# Patient Record
Sex: Female | Born: 1945 | Race: White | Hispanic: No | Marital: Married | Smoking: Never smoker
Health system: Southern US, Community
[De-identification: ages and names within clinical notes are randomized; demographics above are authoritative.]

## PROBLEM LIST (undated history)

## (undated) DIAGNOSIS — S82143A Displaced bicondylar fracture of unspecified tibia, initial encounter for closed fracture: Secondary | ICD-10-CM

## (undated) DIAGNOSIS — F419 Anxiety disorder, unspecified: Secondary | ICD-10-CM

## (undated) HISTORY — PX: KNEE ARTHROCENTESIS: SHX991

## (undated) HISTORY — PX: WRIST FRACTURE SURGERY: SHX121

## (undated) HISTORY — DX: Displaced bicondylar fracture of unspecified tibia, initial encounter for closed fracture: S82.143A

## (undated) HISTORY — PX: ORIF PROXIMAL TIBIAL PLATEAU FRACTURE: SUR953

## (undated) HISTORY — PX: ANKLE FRACTURE SURGERY: SHX122

## (undated) HISTORY — PX: CATARACT EXTRACTION: SUR2

## (undated) HISTORY — PX: ANKLE ARTHROCENTESIS: SHX878

---

## 1997-08-10 ENCOUNTER — Other Ambulatory Visit: Admission: RE | Admit: 1997-08-10 | Discharge: 1997-08-10 | Payer: Self-pay | Admitting: Gynecology

## 1998-09-13 ENCOUNTER — Other Ambulatory Visit: Admission: RE | Admit: 1998-09-13 | Discharge: 1998-09-13 | Payer: Self-pay | Admitting: Gynecology

## 2000-08-07 ENCOUNTER — Inpatient Hospital Stay (HOSPITAL_COMMUNITY): Admission: EM | Admit: 2000-08-07 | Discharge: 2000-08-09 | Payer: Self-pay | Admitting: Emergency Medicine

## 2000-08-07 ENCOUNTER — Encounter: Payer: Self-pay | Admitting: Emergency Medicine

## 2000-08-07 ENCOUNTER — Encounter: Payer: Self-pay | Admitting: Orthopedic Surgery

## 2001-05-30 ENCOUNTER — Other Ambulatory Visit: Admission: RE | Admit: 2001-05-30 | Discharge: 2001-05-30 | Payer: Self-pay | Admitting: Obstetrics and Gynecology

## 2001-11-27 ENCOUNTER — Ambulatory Visit (HOSPITAL_COMMUNITY): Admission: RE | Admit: 2001-11-27 | Discharge: 2001-11-27 | Payer: Self-pay | Admitting: Specialist

## 2003-01-02 ENCOUNTER — Other Ambulatory Visit: Admission: RE | Admit: 2003-01-02 | Discharge: 2003-01-02 | Payer: Self-pay | Admitting: Obstetrics and Gynecology

## 2003-03-27 ENCOUNTER — Ambulatory Visit (HOSPITAL_COMMUNITY): Admission: RE | Admit: 2003-03-27 | Discharge: 2003-03-27 | Payer: Self-pay | Admitting: Gastroenterology

## 2003-04-25 DIAGNOSIS — S82143A Displaced bicondylar fracture of unspecified tibia, initial encounter for closed fracture: Secondary | ICD-10-CM

## 2003-04-25 HISTORY — DX: Displaced bicondylar fracture of unspecified tibia, initial encounter for closed fracture: S82.143A

## 2003-09-18 ENCOUNTER — Inpatient Hospital Stay (HOSPITAL_COMMUNITY): Admission: EM | Admit: 2003-09-18 | Discharge: 2003-09-23 | Payer: Self-pay | Admitting: Emergency Medicine

## 2004-07-14 ENCOUNTER — Other Ambulatory Visit: Admission: RE | Admit: 2004-07-14 | Discharge: 2004-07-14 | Payer: Self-pay | Admitting: Obstetrics and Gynecology

## 2004-11-21 IMAGING — CT CT RECONSTRUCTION
2 series · 10 of 14 positions shown, 12 images · non-contrast
Comparison: none

CLINICAL DATA: 58-year-old patient, who fell. 
 CT OF THE CERVICAL SPINE WITH MULTIPLANAR AND CT OF THE RIGHT KNEE WITH MULTIPLANAR 
 CERVICAL SPINE CT WITHOUT CONTRAST
 Standard helical CT examination of the cervical spine was performed from the skull base down through T-2.  No fractures are identified. The facet joints are maintained and the neural foramen are patent. 
 IMPRESSION
 1.  No evidence for cervical spine fracture. 
 MULTIPLANAR REFORMATTED IMAGES
 Sagittal and coronal reformatted images were obtained off of the axial data.  There is some artifact to the lower cervical spine due to the size of the patient and soft tissue attenuation.  The alignment is normal.  No fractures are seen.  No abnormal prevertebral soft tissue swelling.  Mild facet degenerative changes are noted. 
 1.  Normal alignment and no acute bony findings. 
 CT OF THE RIGHT KNEE 
 Helical CT examination of the knee was performed and demonstrates a complex fracture of the tibial plateau. There is a deep die punch-type fracture splitting the tibial shaft in the midline with displacement of the lateral aspect of the tibia and subluxation of the tibia in relation to the femur.  There is also a small avulsion-type fracture off of the fibular head. 
 1. Complex tibial plateau fracture with a deep vertical die punch-type fracture splitting the tibia in two.  There is subluxation of the tibia laterally in relation to the femur.  
 MULTIPLANAR REFORMAT IMAGES
 Coronal, sagittal and 3-D reformatted images were obtained off of the axial data.  These confirm the above findings and better demonstrate the anatomy.  
 See above report.

[Series 4: rt. knee 2.0 b80s · axial · 0.35mm/px · z∈[-10,+90]mm · 3 of 102 slices shown (1 of 2)]
[im 26/102  bone]
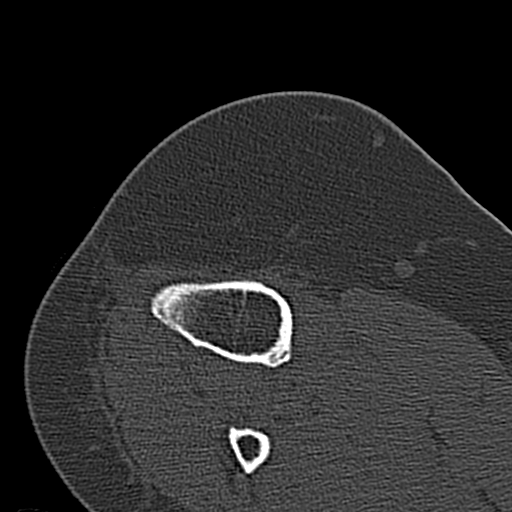
[im 51/102  bone]
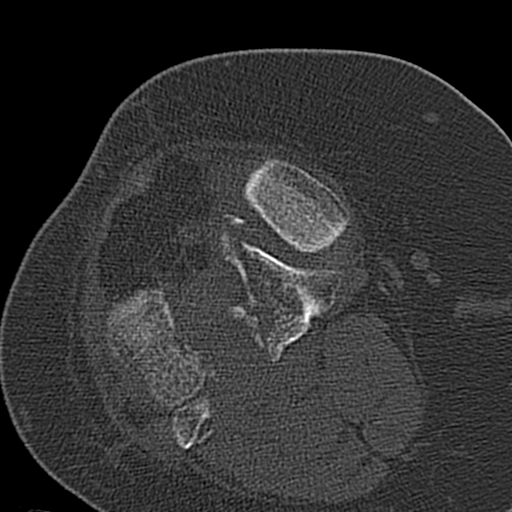
[im 76/102  bone]
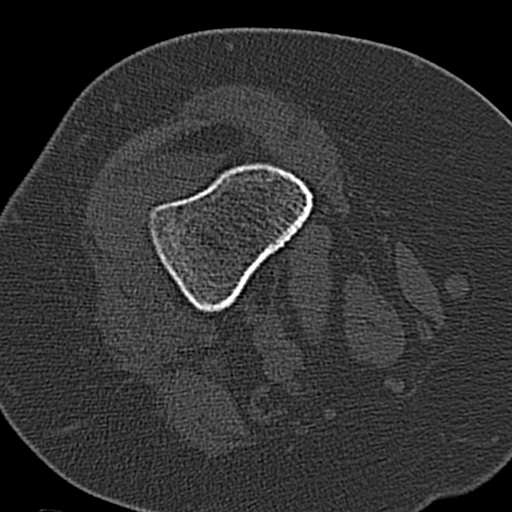

[Series 5: rt. knee 2.0 b80s · axial · 0.35mm/px · z∈[-36,+116]mm · 7 of 204 slices shown, 9 images (2 of 2)]
[im 26/204  soft-tissue]
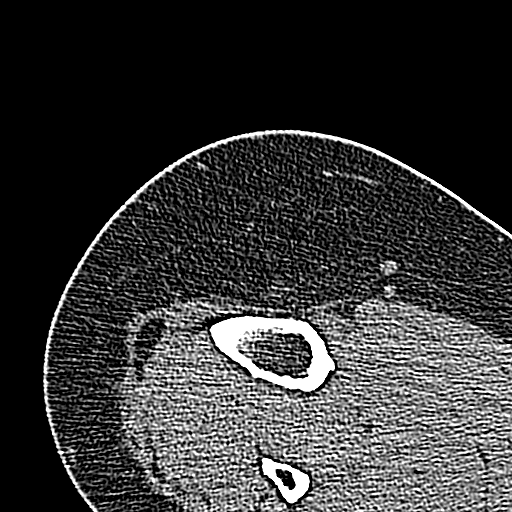
[im 26/204  bone]
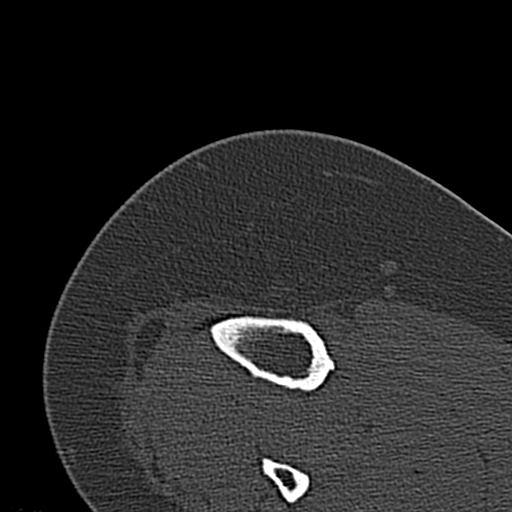
[im 51/204  bone]
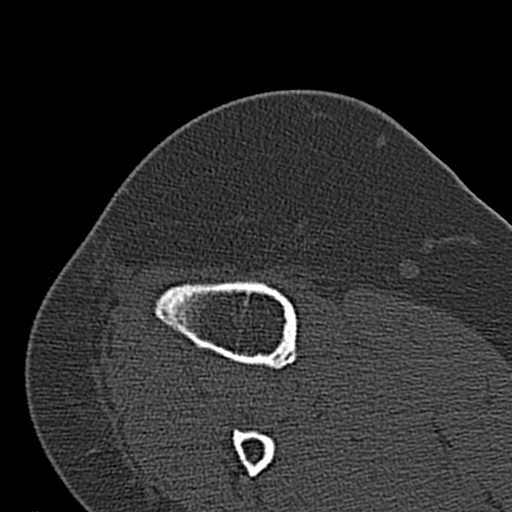
[im 77/204  bone]
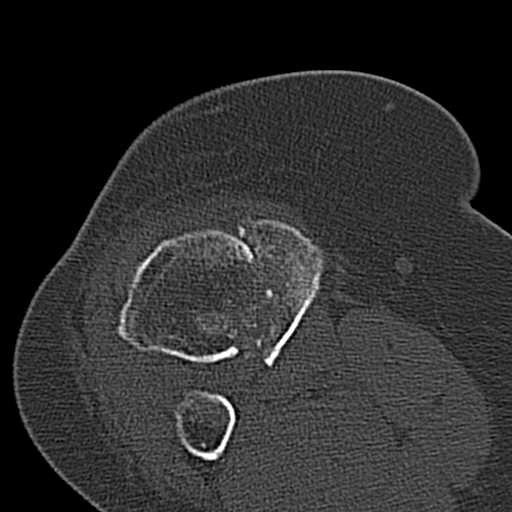
[im 102/204  bone]
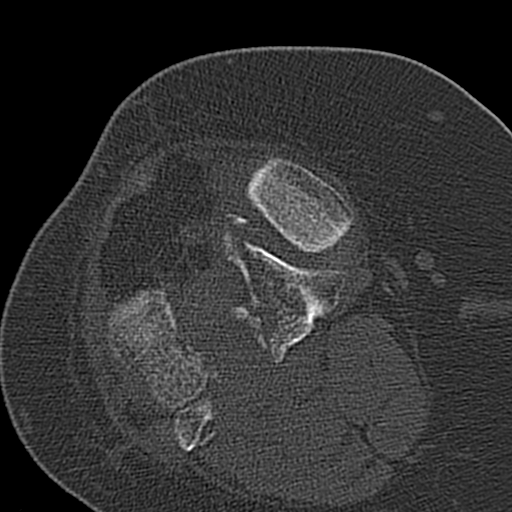
[im 127/204  soft-tissue]
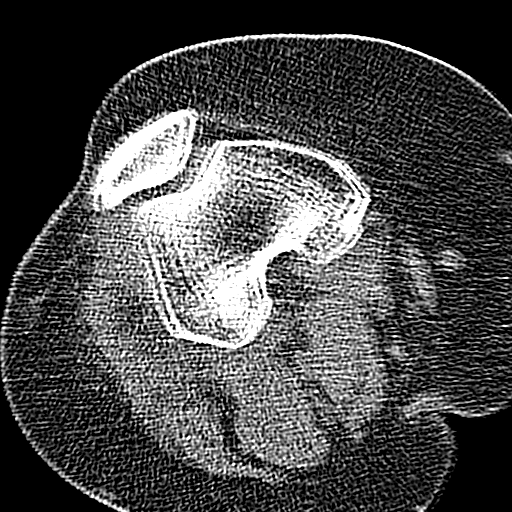
[im 127/204  bone]
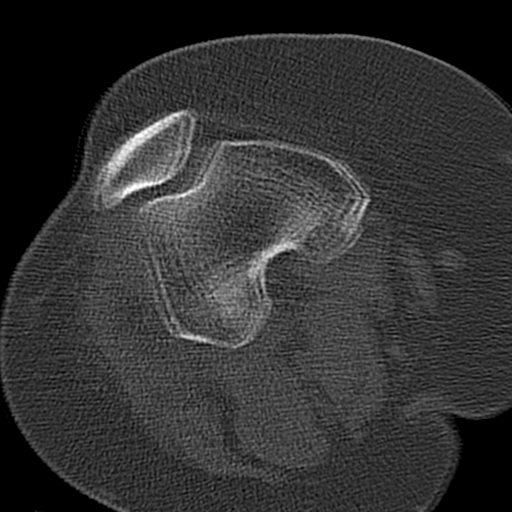
[im 153/204  bone]
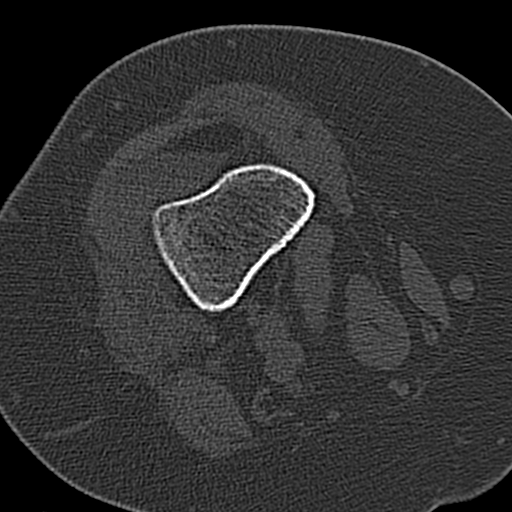
[im 178/204  bone]
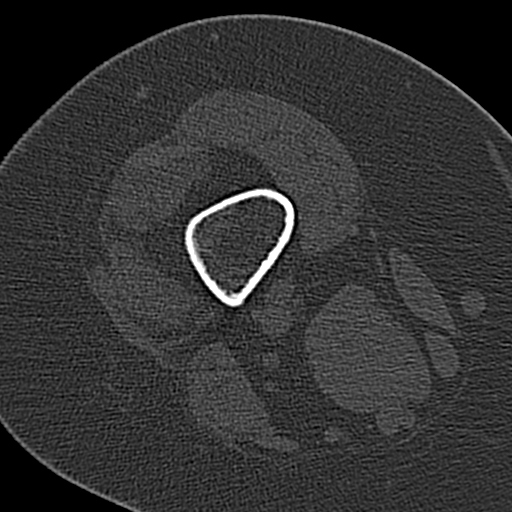

[10 of 14 positions shown; findings below may reference images not displayed]

## 2004-11-21 IMAGING — CR DG CHEST 2V
2 series · 2 of 2 positions shown · non-contrast
Comparison: none

CLINICAL DATA: Right arm and leg fractures.  Preop respiratory exam. 
 TWO VIEW CHEST
 AP and lateral views show low lung volumes, however both lungs are clear.  There is no evidence of pneumothorax or pleural effusion.  Heart size is within normal limits allowing for patient positioning. 
 IMPRESSION
 Low lung volumes.  No active disease.

[view not recorded (1 of 2)]
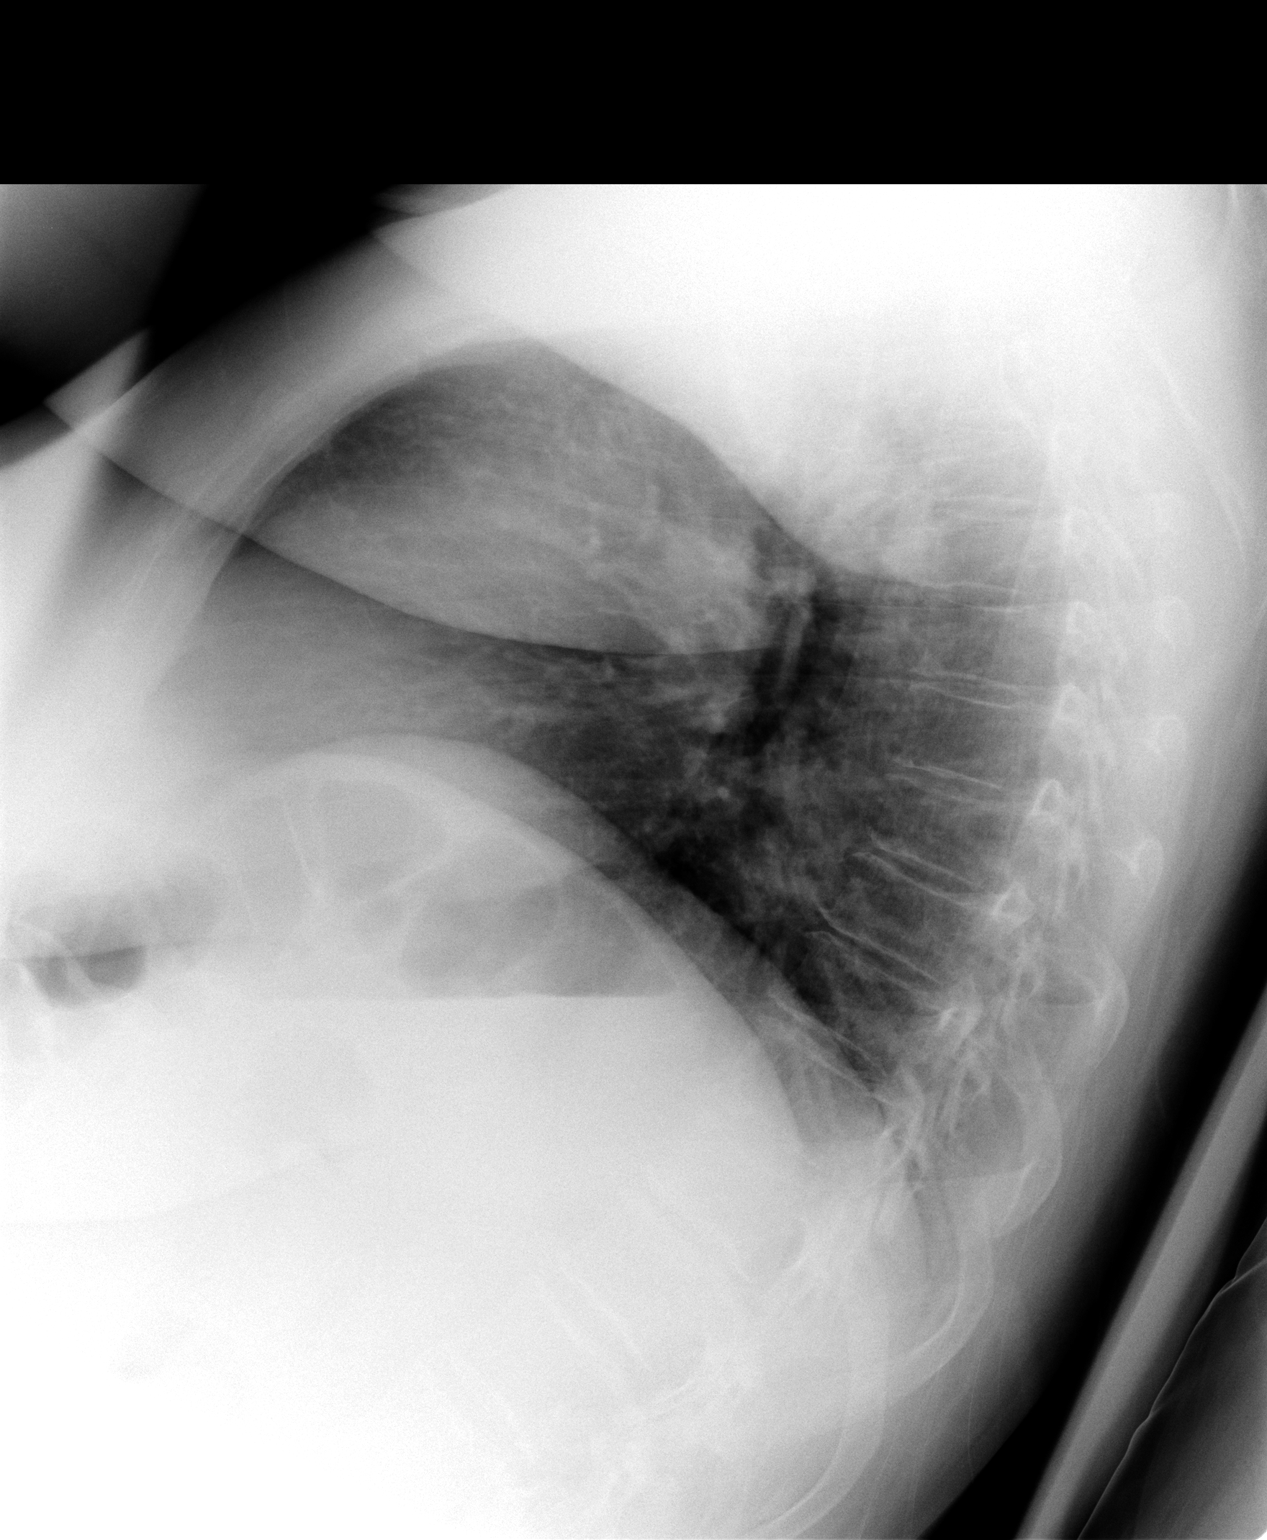

[view not recorded (2 of 2)]
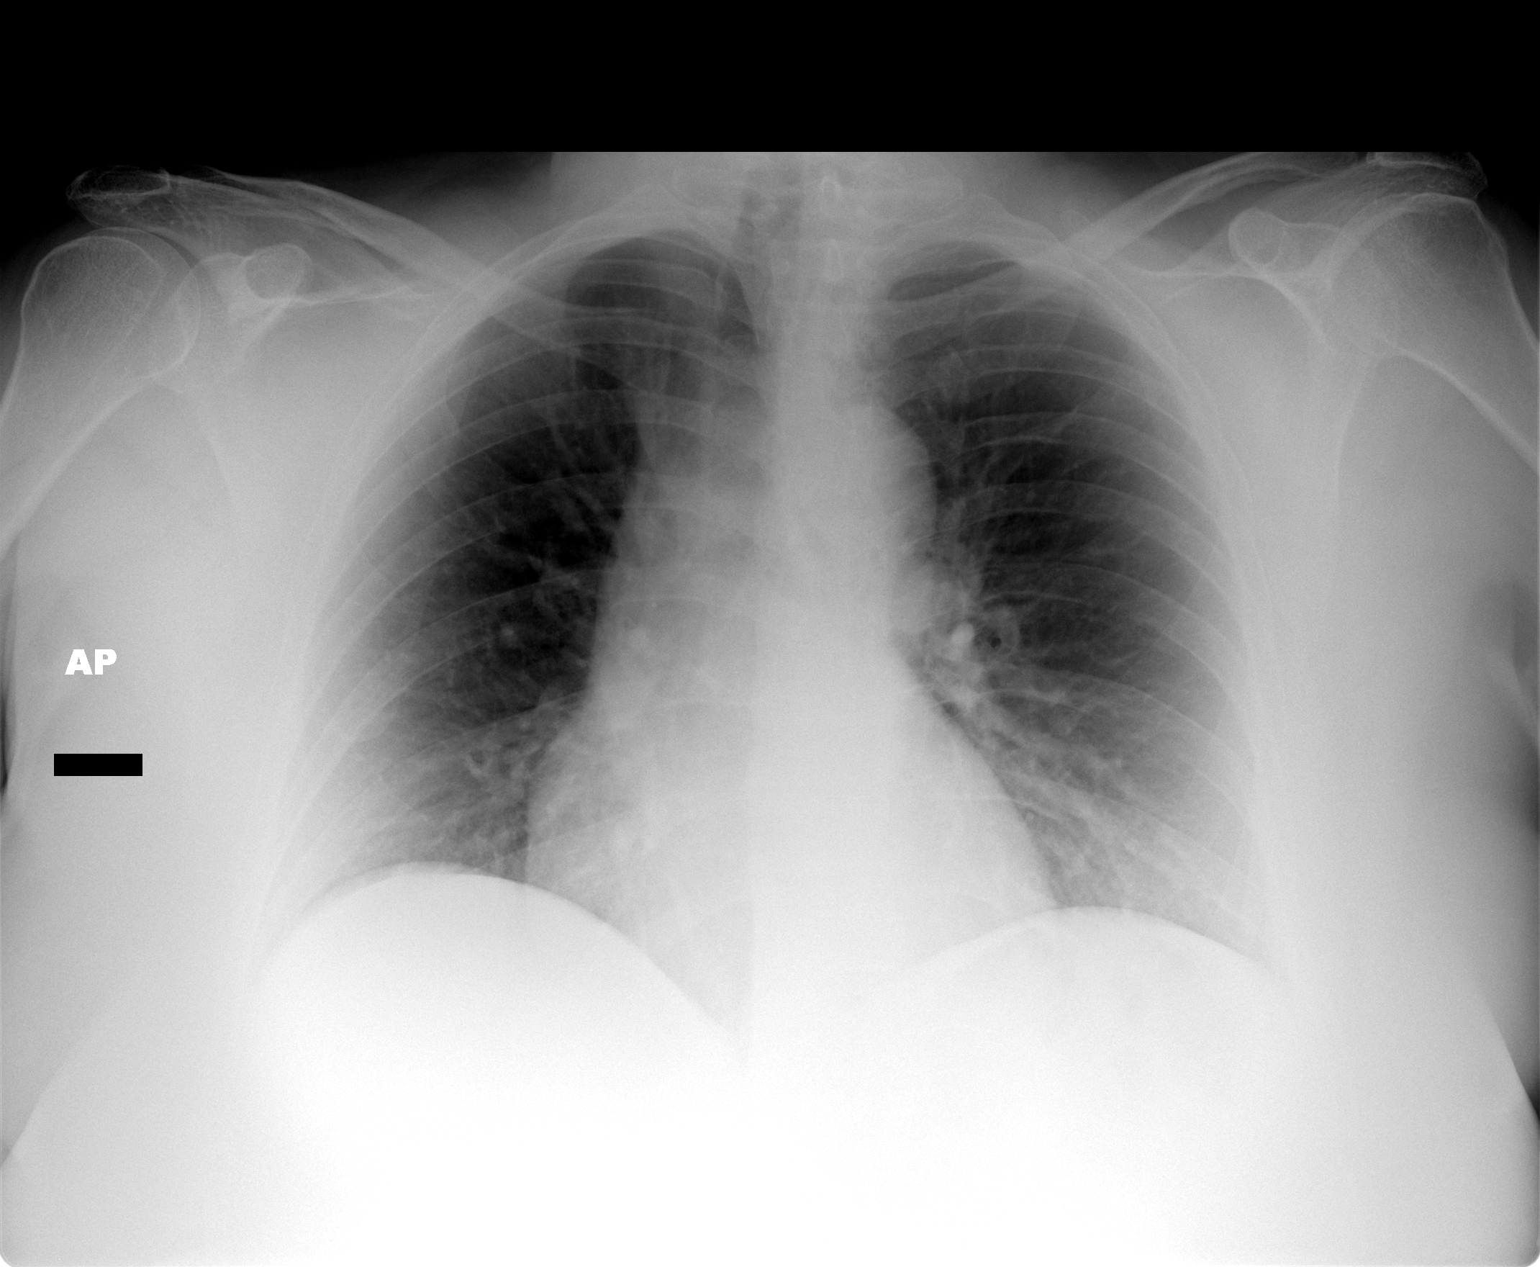

[2 of 2 positions shown; findings below may reference images not displayed]

## 2004-11-21 IMAGING — CT CT EXTREM UP W/O CM*R*
1 of 4 series · 4 of 14 positions shown, 5 images · non-contrast
Comparison: Prior plain film of the right wrist on the same date.

CLINICAL DATA: Right wrist fracture.
CT OF THE RIGHT UPPER EXTREMITY WITHOUT CONTRAST, CT MULTIPLANAR RECONSTRUCTION, 09/18/03

[Series 6: rt. wrist 1.25 b80s · axial · 0.23mm/px · z∈[+1054,+1116]mm · 4 of 211 slices shown, 5 images]
[im 43/211  soft-tissue]
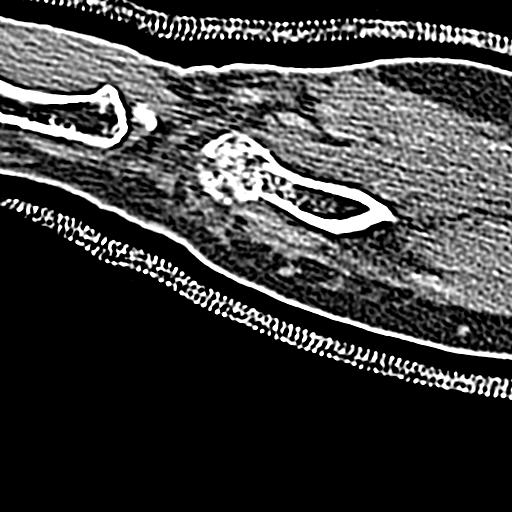
[im 43/211  bone]
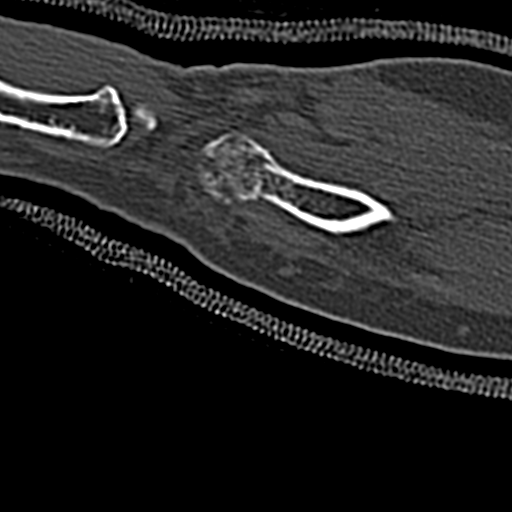
[im 85/211  bone]
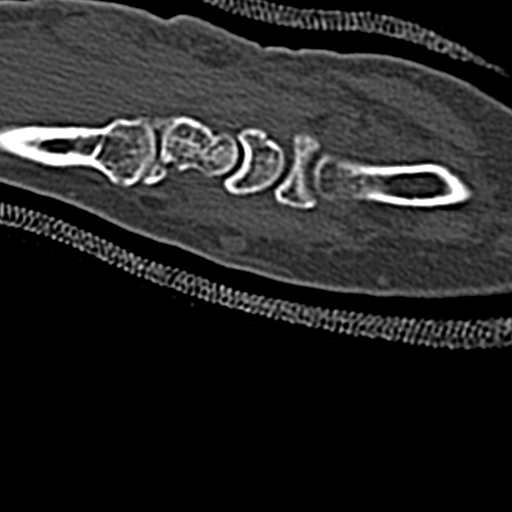
[im 127/211  bone]
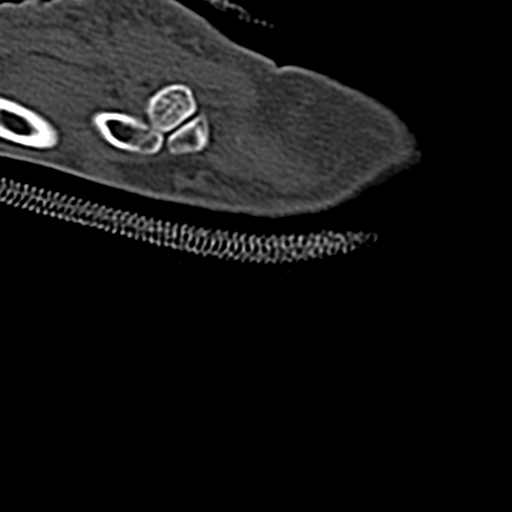
[im 169/211  bone]
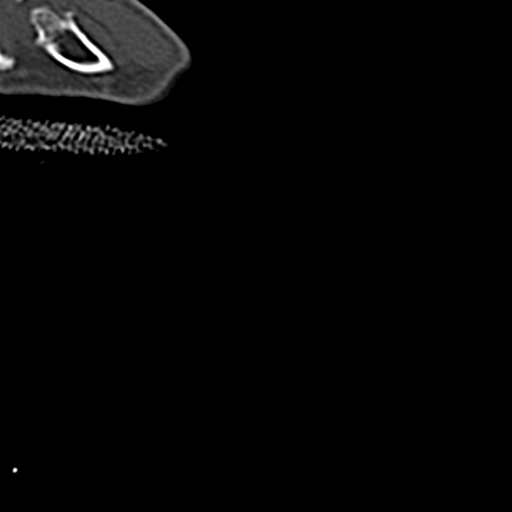

[4 of 14 positions shown; findings below may reference images not displayed]

RIGHT WRIST CT WITHOUT CONTRAST
Unenhanced thin section multidetector CT was performed through the right wrist.  Additional reconstructions were performed in coronal and sagittal projections for interpretative purposes.  
There is a comminuted fracture of the distal radius with mild displacement of multiple fragments.  Fracture planes do extend into the radiocarpal joint.  Fracture also shows mild impaction.  No evidence of carpal bone fracture or subluxation.  There is a tiny avulsion off of the tip of the ulnar styloid.
IMPRESSION
Comminuted intraarticular fracture of the distal radius with mild displacement of multiple fracture fragments as well as mild impaction.  Associated small avulsion off of the tip of the ulnar styloid.  No carpal bone injury is identified.  
CT MULTIPLANAR RECONSTRUCTION
Multiplanar reformatted CT images were reconstructed from the axial CT data set. These images were reviewed and pertinent findings are included in the accompanying complete CT report. 

MPRESSION
See complete CT report.

## 2006-05-14 ENCOUNTER — Ambulatory Visit: Payer: Self-pay | Admitting: Family Medicine

## 2006-05-14 DIAGNOSIS — M810 Age-related osteoporosis without current pathological fracture: Secondary | ICD-10-CM | POA: Insufficient documentation

## 2006-05-14 DIAGNOSIS — J40 Bronchitis, not specified as acute or chronic: Secondary | ICD-10-CM | POA: Insufficient documentation

## 2012-05-14 ENCOUNTER — Ambulatory Visit: Payer: Self-pay | Admitting: Family Medicine

## 2013-01-21 ENCOUNTER — Telehealth: Payer: Self-pay | Admitting: Family Medicine

## 2013-01-21 ENCOUNTER — Encounter: Payer: Self-pay | Admitting: Family Medicine

## 2013-01-21 ENCOUNTER — Ambulatory Visit (INDEPENDENT_AMBULATORY_CARE_PROVIDER_SITE_OTHER): Payer: Medicare Other | Admitting: Family Medicine

## 2013-01-21 VITALS — BP 143/81 | HR 75 | Ht 66.0 in | Wt 239.0 lb

## 2013-01-21 DIAGNOSIS — Z1211 Encounter for screening for malignant neoplasm of colon: Secondary | ICD-10-CM

## 2013-01-21 DIAGNOSIS — Z131 Encounter for screening for diabetes mellitus: Secondary | ICD-10-CM

## 2013-01-21 DIAGNOSIS — Z13220 Encounter for screening for lipoid disorders: Secondary | ICD-10-CM

## 2013-01-21 DIAGNOSIS — Z23 Encounter for immunization: Secondary | ICD-10-CM

## 2013-01-21 DIAGNOSIS — IMO0001 Reserved for inherently not codable concepts without codable children: Secondary | ICD-10-CM

## 2013-01-21 DIAGNOSIS — Z1239 Encounter for other screening for malignant neoplasm of breast: Secondary | ICD-10-CM

## 2013-01-21 DIAGNOSIS — R030 Elevated blood-pressure reading, without diagnosis of hypertension: Secondary | ICD-10-CM

## 2013-01-21 DIAGNOSIS — M810 Age-related osteoporosis without current pathological fracture: Secondary | ICD-10-CM

## 2013-01-21 DIAGNOSIS — Z130 Encounter for screening for diseases of the blood and blood-forming organs and certain disorders involving the immune mechanism: Secondary | ICD-10-CM

## 2013-01-21 NOTE — Telephone Encounter (Signed)
Sue Lush, Will you please let Mrs. gamarra know that I was looking through her old chart and found that she was given a formal diagnosis of osteoporosis sometime before 2011. I'd encourage her to have a repeat bone density scan and have placed an order for this, she should be contacted in the near future about scheduling.

## 2013-01-21 NOTE — Telephone Encounter (Signed)
Pt.notified

## 2013-01-21 NOTE — Progress Notes (Signed)
CC: Megan Walter is a 67 y.o. female is here for Establish Care   Subjective: HPI:  Pleasant 67 year old here to establish care  Patient has no acute complaints but questions if she is overdue for routine screening and lab work  She believes it's been well over 5 years since her cholesterol checked last she is uncertain what past values have been. She has lost 30 pounds in the last 3 months using Weight Watchers and trying to stay physically active.  She believes it's been well over 5 years since blood sugar was checked last she is uncertain what past values have been. She denies vision loss, motor or sensory disturbances, poorly healing wounds  Her last mammogram was done sometime around 2005 she believes that she is always been told she has calcifications in her right breast and has needed diagnostic imaging in the past. She denies any new changes the breast architecture, nipple discharge, nipple retraction, or skin changes on either breast.  Her last colonoscopy was in 2004 she believes she had one polyp since then she has not had any known rectal bleeding  Review of Systems - General ROS: negative for - chills, fever, night sweats, weight gain Ophthalmic ROS: negative for - decreased vision Psychological ROS: negative for - anxiety or depression ENT ROS: negative for - hearing change, nasal congestion, tinnitus or allergies Hematological and Lymphatic ROS: negative for - bleeding problems, bruising or swollen lymph nodes Breast ROS: negative Respiratory ROS: no cough, shortness of breath, or wheezing Cardiovascular ROS: no chest pain or dyspnea on exertion Gastrointestinal ROS: no abdominal pain, change in bowel habits, or black or bloody stools Genito-Urinary ROS: negative for - genital discharge, genital ulcers, incontinence or abnormal bleeding from genitals Musculoskeletal ROS: negative for - joint pain or muscle pain Neurological ROS: negative for - headaches or memory  loss Dermatological ROS: negative for lumps, mole changes, rash and skin lesion changes  Past Medical History  Diagnosis Date  . Tibial plateau fracture 2005    Right      History reviewed. No pertinent family history.   History  Substance Use Topics  . Smoking status: Not on file  . Smokeless tobacco: Not on file  . Alcohol Use: Not on file     Objective: Filed Vitals:   01/21/13 1102  BP: 143/81  Pulse: 75    General: Alert and Oriented, No Acute Distress HEENT: Pupils equal, round, reactive to light. Conjunctivae clear.  External ears unremarkable, canals clear with intact TMs with appropriate landmarks.  Middle ear appears open without effusion. Pink inferior turbinates.  Moist mucous membranes, pharynx without inflammation nor lesions.  Neck supple without palpable lymphadenopathy nor abnormal masses. Lungs: Clear to auscultation bilaterally, no wheezing/ronchi/rales.  Comfortable work of breathing. Good air movement. Cardiac: Regular rate and rhythm. Normal S1/S2.  No murmurs, rubs, nor gallops.   Abdomen: Soft nontender Extremities: No peripheral edema.  Strong peripheral pulses.  Mental Status: No depression, anxiety, nor agitation. Skin: Warm and dry.  Assessment & Plan: Megan Walter was seen today for establish care.  Diagnoses and associated orders for this visit:  Elevated blood pressure - BASIC METABOLIC PANEL WITH GFR  Diabetes mellitus screening - BASIC METABOLIC PANEL WITH GFR  Lipid screening - Lipid panel  Screening for deficiency anemia - CBC  Screening for breast cancer  Colon cancer screening - Ambulatory referral to Gastroenterology  Need for prophylactic vaccination and inoculation against influenza    Elevated blood pressure: Encouraged her to continue  on Weight Watchers and to minimize salt intake to help avoiding eating antihypertensives in the near future, if elevated at followup visit will offer recommendations on  antihypertensives Counseled that she is overdue for diabetic screening, anemia screening, dyslipidemia screening she will have these labs done today She is overdue for colon cancer screening referral has been placed She is overdue for mammogram she is going to look into having this repeated at the [facility] [facility] were she has had this done before, she has agreed to call me if she has trouble coordinating this so we can schedule her for imaging here She is overdue for a DEXA scan she has a history of osteoporosis on chart review however I cannot find prior DEXA scan results   30 minutes spent face-to-face during visit today of which at least 50% was counseling or coordinating care regarding breast cancer screening, and colon cancer screening, anemia screening, lipid screening, diabetic screening, elevated blood pressure.   Return in about 4 weeks (around 02/18/2013) for BP Check.

## 2013-01-22 LAB — LIPID PANEL
Cholesterol: 205 mg/dL — ABNORMAL HIGH (ref 0–200)
Total CHOL/HDL Ratio: 4.2 Ratio
Triglycerides: 118 mg/dL (ref <150)
VLDL: 24 mg/dL (ref 0–40)

## 2013-01-22 LAB — CBC
HCT: 43.5 % (ref 36.0–46.0)
Hemoglobin: 14.7 g/dL (ref 12.0–15.0)
MCH: 28.2 pg (ref 26.0–34.0)
MCV: 83.3 fL (ref 78.0–100.0)
Platelets: 271 10*3/uL (ref 150–400)
RDW: 13.8 % (ref 11.5–15.5)

## 2013-01-22 LAB — BASIC METABOLIC PANEL WITH GFR
Calcium: 9.6 mg/dL (ref 8.4–10.5)
Chloride: 106 mEq/L (ref 96–112)
GFR, Est Non African American: >89 mL/min

## 2013-01-27 ENCOUNTER — Encounter: Payer: Self-pay | Admitting: *Deleted

## 2013-02-03 ENCOUNTER — Telehealth: Payer: Self-pay | Admitting: *Deleted

## 2013-02-03 DIAGNOSIS — Z1239 Encounter for other screening for malignant neoplasm of breast: Secondary | ICD-10-CM

## 2013-02-03 NOTE — Telephone Encounter (Signed)
Patient advised.

## 2013-02-03 NOTE — Telephone Encounter (Signed)
Pt is asking about setting up an appointment for her mammogram.

## 2013-02-03 NOTE — Telephone Encounter (Signed)
Sue Lush, Will you please let Mrs. Sacca know that I will place a referral for this.

## 2013-03-06 ENCOUNTER — Encounter: Payer: Self-pay | Admitting: Family Medicine

## 2013-03-07 ENCOUNTER — Ambulatory Visit (INDEPENDENT_AMBULATORY_CARE_PROVIDER_SITE_OTHER): Payer: Medicare Other | Admitting: Physician Assistant

## 2013-03-07 ENCOUNTER — Encounter: Payer: Self-pay | Admitting: Physician Assistant

## 2013-03-07 VITALS — BP 131/79 | HR 89 | Wt 227.0 lb

## 2013-03-07 DIAGNOSIS — F4321 Adjustment disorder with depressed mood: Secondary | ICD-10-CM

## 2013-03-07 MED ORDER — ESCITALOPRAM OXALATE 10 MG PO TABS: 10.0000 mg | ORAL_TABLET | Freq: Every day | ORAL | Status: DC

## 2013-03-07 NOTE — Progress Notes (Addendum)
  Subjective:    Patient ID: Megan Walter, female    DOB: November 09, 1945, 67 y.o.   MRN: [id number]  HPI  Patient is a 67 year old female who presents to the clinic with chief complaint of crying all the time. Patient has always been sensitive emotionally; however, for the last week she has found herself crying uncontrollably. Anytime she speaks with anyone about her situation she starts crying. She admits to having a lot going on. She is taking care of her husband who was in an accident years ago and lost his leg. She is also now starting to take care of her mother who is very sick. She reports in office that her mother is ready to die because she is tired to living but is likely not sick enough. Patient feels today like the way of the world is on her shoulders. She feels like she is letting everyone down. She has never taken any kind of medication for depression. She denies any feelings of anxiousness. She denies any panic attacks. She denies suicidal or harmful thoughts to others.   Review of Systems     Objective:   Physical Exam  Constitutional: She is oriented to person, place, and time. She appears well-developed and well-nourished.  HENT:  Head: Normocephalic and atraumatic.  Cardiovascular: Normal rate, regular rhythm and normal heart sounds.   Pulmonary/Chest: Effort normal and breath sounds normal. She has no wheezes.  Neurological: She is alert and oriented to person, place, and time.  Skin: Skin is warm and dry.  Psychiatric:  Cried thoughout encounter seemed very down.           Assessment & Plan:  Adjustment disorder of depressive mood- I started pt on lexapro daily. Discussed side effects of nausea, weight gain, worsening depression. Pt aware and will call office if has any side effects. Follow up with PCP in 6 weeks. Encouraged pt to talk things out and discussed positive thinking.    Spent 30 minutes with patient and greater than 50 percent of visit spent counseling  patient regarding depression.

## 2014-02-06 ENCOUNTER — Other Ambulatory Visit: Payer: Self-pay

## 2014-02-13 ENCOUNTER — Encounter: Payer: Self-pay | Admitting: Family Medicine

## 2014-02-13 ENCOUNTER — Ambulatory Visit (INDEPENDENT_AMBULATORY_CARE_PROVIDER_SITE_OTHER): Payer: Medicare Other | Admitting: Family Medicine

## 2014-02-13 VITALS — BP 142/86 | HR 83 | Ht 65.0 in | Wt 188.0 lb

## 2014-02-13 DIAGNOSIS — Z1239 Encounter for other screening for malignant neoplasm of breast: Secondary | ICD-10-CM

## 2014-02-13 DIAGNOSIS — Z124 Encounter for screening for malignant neoplasm of cervix: Secondary | ICD-10-CM

## 2014-02-13 DIAGNOSIS — Z1211 Encounter for screening for malignant neoplasm of colon: Secondary | ICD-10-CM

## 2014-02-13 DIAGNOSIS — Z0000 Encounter for general adult medical examination without abnormal findings: Secondary | ICD-10-CM

## 2014-02-13 DIAGNOSIS — Z23 Encounter for immunization: Secondary | ICD-10-CM

## 2014-02-13 DIAGNOSIS — M810 Age-related osteoporosis without current pathological fracture: Secondary | ICD-10-CM

## 2014-02-13 LAB — CBC
HCT: 44.9 % (ref 36.0–46.0)
Hemoglobin: 14.9 g/dL (ref 12.0–15.0)
MCH: 28.5 pg (ref 26.0–34.0)
MCHC: 33.2 g/dL (ref 30.0–36.0)
MCV: 86 fL (ref 78.0–100.0)
Platelets: 265 10*3/uL (ref 150–400)
RBC: 5.22 MIL/uL — AB (ref 3.87–5.11)
RDW: 13.1 % (ref 11.5–15.5)
WBC: 5.1 10*3/uL (ref 4.0–10.5)

## 2014-02-13 LAB — BASIC METABOLIC PANEL WITH GFR
BUN: 26 mg/dL — AB (ref 6–23)
CALCIUM: 9.6 mg/dL (ref 8.4–10.5)
CHLORIDE: 102 meq/L (ref 96–112)
CO2: 28 mEq/L (ref 19–32)
CREATININE: 0.68 mg/dL (ref 0.50–1.10)
GFR, Est African American: >89 mL/min
Glucose, Bld: 77 mg/dL (ref 70–99)
Potassium: 4.7 mEq/L (ref 3.5–5.3)
Sodium: 139 mEq/L (ref 135–145)

## 2014-02-13 LAB — LIPID PANEL
Cholesterol: 189 mg/dL (ref 0–200)
HDL: 63 mg/dL (ref >39)
LDL Cholesterol: 108 mg/dL — ABNORMAL HIGH (ref 0–99)
Total CHOL/HDL Ratio: 3 Ratio
Triglycerides: 90 mg/dL (ref <150)
VLDL: 18 mg/dL (ref 0–40)

## 2014-02-13 MED ORDER — ESCITALOPRAM OXALATE 10 MG PO TABS: 10.0000 mg | ORAL_TABLET | Freq: Every day | ORAL | Status: DC

## 2014-02-13 MED ORDER — ZOSTER VACCINE LIVE 19400 UNT/0.65ML ~~LOC~~ SOLR: 0.6500 mL | Freq: Once | SUBCUTANEOUS | Status: DC

## 2014-02-13 NOTE — Patient Instructions (Signed)
Dr. Berdella Bacot's General Advice Following Your Complete Physical Exam  The Benefits of Regular Exercise: Unless you suffer from an uncontrolled cardiovascular condition, studies strongly suggest that regular exercise and physical activity will add to both the quality and length of your life.  The World Health Organization recommends 150 minutes of moderate intensity aerobic activity every week.  This is best split over 3-4 days a week, and can be as simple as a brisk walk for just over 35 minutes "most days of the week".  This type of exercise has been shown to lower LDL-Cholesterol, lower average blood sugars, lower blood pressure, lower cardiovascular disease risk, improve memory, and increase one's overall sense of wellbeing.  The addition of anaerobic (or "strength training") exercises offers additional benefits including but not limited to increased metabolism, prevention of osteoporosis, and improved overall cholesterol levels.  How Can I Strive For A Low-Fat Diet?: Current guidelines recommend that 25-35 percent of your daily energy (food) intake should come from fats.  One might ask how can this be achieved without having to dissect each meal on a daily basis?  Switch to skim or 1% milk instead of whole milk.  Focus on lean meats such as ground turkey, fresh fish, baked chicken, and lean cuts of beef as your source of dietary protein.  Limit saturated fat consumption to less than 10% of your daily caloric intake.  Limit trans fatty acid consumption primarily by limiting synthetic trans fats such as partially hydrogenated oils (Ex: fried fast foods).  Substitute olive or vegetable oil for solid fats where possible.  Moderation of Salt Intake: Provided you don't carry a diagnosis of congestive heart failure nor renal failure, I recommend a daily allowance of no more than 2300 mg of salt (sodium).  Keeping under this daily goal is associated with a decreased risk of cardiovascular events, creeping  above it can lead to elevated blood pressures and increases your risk of cardiovascular events.  Milligrams (mg) of salt is listed on all nutrition labels, and your daily intake can add up faster than you think.  Most canned and frozen dinners can pack in over half your daily salt allowance in one meal.    Lifestyle Health Risks: Certain lifestyle choices carry specific health risks.  As you may already know, tobacco use has been associated with increasing one's risk of cardiovascular disease, pulmonary disease, numerous cancers, among many other issues.  What you may not know is that there are medications and nicotine replacement strategies that can more than double your chances of successfully quitting.  I would be thrilled to help manage your quitting strategy if you currently use tobacco products.  When it comes to alcohol use, I've yet to find an "ideal" daily allowance.  Provided an individual does not have a medical condition that is exacerbated by alcohol consumption, general guidelines determine "safe drinking" as no more than two standard drinks for a man or no more than one standard drink for a female per day.  However, much debate still exists on whether any amount of alcohol consumption is technically "safe".  My general advice, keep alcohol consumption to a minimum for general health promotion.  If you or others believe that alcohol, tobacco, or recreational drug use is interfering with your life, I would be happy to provide confidential counseling regarding treatment options.  General "Over The Counter" Nutrition Advice: Postmenopausal women should aim for a daily calcium intake of 1200 mg, however a significant portion of this might already be   provided by diets including milk, yogurt, cheese, and other dairy products.  Vitamin D has been shown to help preserve bone density, prevent fatigue, and has even been shown to help reduce falls in the elderly.  Ensuring a daily intake of 800 Units of  Vitamin D is a good place to start to enjoy the above benefits, we can easily check your Vitamin D level to see if you'd potentially benefit from supplementation beyond 800 Units a day.  Folic Acid intake should be of particular concern to women of childbearing age.  Daily consumption of 400-800 mcg of Folic Acid is recommended to minimize the chance of spinal cord defects in a fetus should pregnancy occur.    For many adults, accidents still remain one of the most common culprits when it comes to cause of death.  Some of the simplest but most effective preventitive habits you can adopt include regular seatbelt use, proper helmet use, securing firearms, and regularly testing your smoke and carbon monoxide detectors.  Kamya Watling B. Iza Preston DO [facility] [address], [address] , [place] [address] Phone: [phone]  

## 2014-02-13 NOTE — Progress Notes (Signed)
As Subjective:    Megan Walter is a 68 y.o. female who presents for Medicare Initial preventive examination.  Preventive Screening-Counseling & Management  Tobacco History  Smoking status  . Never Smoker   Smokeless tobacco  . Not on file    Colonoscopy: Last in 2005, referral placed, importance emphasized Papsmear: She has elected to stop obtaining these Mammogram: overdue, I encouraged her to have this done in our office as downstairs, she politely declines and would prefer to have this done at her daughter's OB/GYN clinic. I encouraged her to have this in the near future  DEXA: Overdue, orders placed today  Influenza Vaccine: getting today Pneumovax: Prevnar today Td/Tdap: Uncertain Zoster: I provided her with a prescription so she can go to her local pharmacy to get an idea of how much was going to cost and if financially reasonable I've encouraged her to have this vaccine.     Problems Prior to Visit 1. Edgyness and moodiness for the past 3 months since the decline of health involving her mother. Her mother passed away 2 months ago. She ran out of Lexapro in early months of 2015 and felt that it was helping with similar symptoms at that time. She's uncertain whether or not she wants to restart Lexapro but she does notice that it was beneficial and do not cause any side effects  Current Problems (verified) Patient Active Problem List   Diagnosis Date Noted  . Adjustment disorder with depressed mood 03/07/2013  . Screening for breast cancer 01/21/2013  . BRONCHITIS NOS 05/14/2006  . OSTEOPOROSIS NOS 05/14/2006    Medications Prior to Visit Current Outpatient Prescriptions on File Prior to Visit  Medication Sig Dispense Refill  . calcium citrate (CALCITRATE - DOSED IN MG ELEMENTAL CALCIUM) 950 MG tablet Take 1 tablet by mouth daily.      . cholecalciferol (VITAMIN D) 1000 UNITS tablet Take 1,000 Units by mouth daily.       No current facility-administered medications  on file prior to visit.    Current Medications (verified) Current Outpatient Prescriptions  Medication Sig Dispense Refill  . calcium citrate (CALCITRATE - DOSED IN MG ELEMENTAL CALCIUM) 950 MG tablet Take 1 tablet by mouth daily.      . cholecalciferol (VITAMIN D) 1000 UNITS tablet Take 1,000 Units by mouth daily.      Marland Kitchen escitalopram (LEXAPRO) 10 MG tablet Take 1 tablet (10 mg total) by mouth daily.  30 tablet  2  . zoster vaccine live, PF, (ZOSTAVAX) 16109 UNT/0.65ML injection Inject 19,400 Units into the skin once.  1 each  0   No current facility-administered medications for this visit.     Allergies (verified) Review of patient's allergies indicates no known allergies.   PAST HISTORY  Family History No family history on file.  Social History History  Substance Use Topics  . Smoking status: Never Smoker   . Smokeless tobacco: Not on file  . Alcohol Use: No     Are there smokers in your home (other than you)? No  Risk Factors Current exercise habits: no current exercising  Dietary issues discussed: DASH diet   Cardiac risk factors: advanced age (older than 23 for men, 35 for women) and sedentary lifestyle.  Depression Screen (Note: if answer to either of the following is "Yes", a more complete depression screening is indicated)   Over the past 2 weeks, have you felt down, depressed or hopeless? Yes  Over the past 2 weeks, have you felt little interest  or pleasure in doing things? No  Have you lost interest or pleasure in daily life? No  Do you often feel hopeless? No  Do you cry easily over simple problems? No  Activities of Daily Living In your present state of health, do you have any difficulty performing the following activities?:  Driving? No Managing money?  No Feeding yourself? No Getting from bed to chair? No Climbing a flight of stairs? No Preparing food and eating?: No Bathing or showering? No Getting dressed: No Getting to the toilet? No Using the  toilet:No Moving around from place to place: No In the past year have you fallen or had a near fall?:No   Are you sexually active?  No  Do you have more than one partner?  No  Hearing Difficulties: No Do you often ask people to speak up or repeat themselves? No Do you experience ringing or noises in your ears? No Do you have difficulty understanding soft or whispered voices? No   Do you feel that you have a problem with memory? No  Do you often misplace items? No  Do you feel safe at home?  No  Cognitive Testing  Alert? Yes  Normal Appearance?Yes  Oriented to person? Yes  Place? Yes   Time? Yes  Recall of three objects?  Yes  Can perform simple calculations? Yes  Displays appropriate judgment?Yes  Can read the correct time from a watch face?Yes   Advanced Directives have been discussed with the patient? Yes  List the Names of Other Physician/Practitioners you currently use: 1.    Indicate any recent Medical Services you may have received from other than [facility] providers in the past year (date may be approximate).  Immunization History  Administered Date(s) Administered  . Influenza,inj,Quad PF,36+ Mos 01/21/2013, 02/13/2014  . Pneumococcal Conjugate-13 02/13/2014    Screening Tests Health Maintenance  Topic Date Due  . Tetanus/tdap  05/02/1966  . Mammogram  05/02/1997  . Colonoscopy  05/02/1997  . Zostavax  05/03/2007  . Pneumococcal Polysaccharide Vaccine Age 66 And Over  05/02/2012  . Influenza Vaccine  11/22/2013    All answers were reviewed with the patient and necessary referrals were made:  Laren BoomHommel, Waynetta Metheny, DO   02/13/2014   History reviewed: allergies, current medications, past family history, past medical history, past social history, past surgical history and problem list  Review of Systems Review of Systems - General ROS: negative for - chills, fever, night sweats, weight gain or weight loss Ophthalmic ROS: negative for - decreased vision Psychological  ROS: negative for - anxiety ENT ROS: negative for - hearing change, nasal congestion, tinnitus or allergies Hematological and Lymphatic ROS: negative for - bleeding problems, bruising or swollen lymph nodes Breast ROS: negative Respiratory ROS: no cough, shortness of breath, or wheezing Cardiovascular ROS: no chest pain or dyspnea on exertion Gastrointestinal ROS: no abdominal pain, change in bowel habits, or black or bloody stools Genito-Urinary ROS: negative for - genital discharge, genital ulcers, incontinence or abnormal bleeding from genitals Musculoskeletal ROS: negative for - joint pain or muscle pain Neurological ROS: negative for - headaches or memory loss Dermatological ROS: negative for lumps, mole changes, rash and skin lesion changes   Objective:     Vision by Snellen chart: right eye:20/20, left eye:20/20  Body mass index is 31.28 kg/(m^2). BP 142/86  Pulse 83  Ht 5\' 5"  (1.651 m)  Wt 188 lb (85.276 kg)  BMI 31.28 kg/m2  General: No Acute Distress HEENT: Atraumatic, normocephalic, conjunctivae normal  without scleral icterus.  No nasal discharge, hearing grossly intact, TMs with good landmarks bilaterally with no middle ear abnormalities, posterior pharynx clear without oral lesions. Neck: Supple, trachea midline, no cervical nor supraclavicular adenopathy. Pulmonary: Clear to auscultation bilaterally without wheezing, rhonchi, nor rales. Cardiac: Regular rate and rhythm.  No murmurs, rubs, nor gallops. No peripheral edema.  2+ peripheral pulses bilaterally. Abdomen: Bowel sounds normal.  No masses.  Non-tender without rebound.  Negative Murphy's sign. MSK: Grossly intact, no signs of weakness.  Full strength throughout upper and lower extremities.  Full ROM in upper and lower extremities.  No midline spinal tenderness. Neuro: Gait unremarkable, CN II-XII grossly intact.  C5-C6 Reflex 2/4 Bilaterally, L4 Reflex 2/4 Bilaterally.  Cerebellar function intact. Skin: No  rashes. Psych: Alert and oriented to person/place/time.  Thought process normal. No anxiety/depression.   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      Plan:     During the course of the visit the patient was educated and counseled about appropriate screening and preventive services including:    Pneumococcal vaccine   Influenza vaccine  Screening mammography  Bone densitometry screening  Colorectal cancer screening  I provided her with Lexapro that she can start after thinking about which a greater bother for her quality of life: Suffering from mild depression or taking Lexapro on a daily basis, she has reservations about taking medication on a daily basis and currently would prefer not to be on anything.  Diet review for nutrition referral? not indicated  Patient Instructions (the written plan) was given to the patient.  Medicare Attestation I have personally reviewed: The patient's medical and social history Their use of alcohol, tobacco or illicit drugs Their current medications and supplements The patient's functional ability including ADLs,fall risks, home safety risks, cognitive, and hearing and visual impairment Diet and physical activities Evidence for depression or mood disorders  The patient's weight, height, BMI, and visual acuity have been recorded in the chart.  I have made referrals, counseling, and provided education to the patient based on review of the above and I have provided the patient with a written personalized care plan for preventive services.     Laren BoomHommel, Naquan Garman, DO   02/13/2014

## 2014-06-04 ENCOUNTER — Other Ambulatory Visit: Payer: Self-pay | Admitting: *Deleted

## 2014-06-04 ENCOUNTER — Other Ambulatory Visit: Payer: Self-pay | Admitting: Family Medicine

## 2014-06-04 MED ORDER — ESCITALOPRAM OXALATE 10 MG PO TABS: 10.0000 mg | ORAL_TABLET | Freq: Every day | ORAL | Status: DC

## 2014-07-31 ENCOUNTER — Other Ambulatory Visit: Payer: Self-pay | Admitting: Family Medicine

## 2014-09-01 ENCOUNTER — Other Ambulatory Visit: Payer: Self-pay | Admitting: Family Medicine

## 2014-09-02 NOTE — Telephone Encounter (Signed)
Called Pt, inquired about setting up f/u appt. Pt states she hasn't been taking the lexapro as directed as does not need a refill. Advised Pt I would refuse this refill and to contact us when she is ready to schedule an appt. Verbalized understanding, no further questions.

## 2015-02-17 ENCOUNTER — Ambulatory Visit: Payer: Self-pay | Admitting: Family Medicine

## 2015-06-18 ENCOUNTER — Telehealth: Payer: Self-pay

## 2015-06-18 MED ORDER — OSELTAMIVIR PHOSPHATE 75 MG PO CAPS: 75.0000 mg | ORAL_CAPSULE | Freq: Every day | ORAL | Status: DC

## 2015-06-18 NOTE — Telephone Encounter (Signed)
Megan Walter is taking care of her grandson and he tested positive for the flu. Patient does not have any symptoms at this time. Sent in Tamiflu 75 mg 1 daily for 10 days.

## 2015-07-16 ENCOUNTER — Encounter: Payer: Self-pay | Admitting: Family Medicine

## 2015-07-16 ENCOUNTER — Ambulatory Visit (INDEPENDENT_AMBULATORY_CARE_PROVIDER_SITE_OTHER): Payer: Medicare Other | Admitting: Family Medicine

## 2015-07-16 VITALS — BP 145/78 | HR 87 | Wt 248.0 lb

## 2015-07-16 DIAGNOSIS — S76311A Strain of muscle, fascia and tendon of the posterior muscle group at thigh level, right thigh, initial encounter: Secondary | ICD-10-CM | POA: Diagnosis not present

## 2015-07-16 DIAGNOSIS — S76011A Strain of muscle, fascia and tendon of right hip, initial encounter: Secondary | ICD-10-CM

## 2015-07-16 MED ORDER — ZOSTER VACCINE LIVE 19400 UNT/0.65ML ~~LOC~~ SOLR: 0.6500 mL | Freq: Once | SUBCUTANEOUS | Status: DC

## 2015-07-16 NOTE — Assessment & Plan Note (Signed)
I believe the predominant pain is located in the gluteus medius region. The description of her history is consistent with a strain. I suspect she is a gluteus medius strain. Plan for TENS unit heating pad NSAIDs and physical therapy. Recheck in 2-4 weeks.

## 2015-07-16 NOTE — Progress Notes (Signed)
   Subjective:    I'm seeing this patient as a consultation for:  Dr. Ivan AnchorsHommel  CC: Right hip pain  HPI: Patient is a nearly 2 week history of right buttocks pain. She was seated and rotating to clean a fireplace hearth. A few minutes later she developed pain in her right buttocks. She's had pain since. Pain is worse with activity such as standing and walking and better with rest. She's tried ibuprofen which certainly helps. She denies any radiating pain weakness or numbness. She feels well otherwise.  Past medical history, Surgical history, Family history not pertinant except as noted below, Social history, Allergies, and medications have been entered into the medical record, reviewed, and no changes needed.   Review of Systems: No headache, visual changes, nausea, vomiting, diarrhea, constipation, dizziness, abdominal pain, skin rash, fevers, chills, night sweats, weight loss, swollen lymph nodes, body aches, joint swelling, muscle aches, chest pain, shortness of breath, mood changes, visual or auditory hallucinations.   Objective:    Filed Vitals:   07/16/15 1045  BP: 145/78  Pulse: 87   General: Well Developed, well nourished, and in no acute distress.  Neuro/Psych: Alert and oriented x3, extra-ocular muscles intact, able to move all 4 extremities, sensation grossly intact. Skin: Warm and dry, no rashes noted.  Respiratory: Not using accessory muscles, speaking in full sentences, trachea midline.  Cardiovascular: Pulses palpable, no extremity edema. Abdomen: Does not appear distended. MSK: Back is nontender. Normal back motion. Hip is mildly tender to palpation in the right greater trochanteric region including the gluteus medius location. Normal hip motion without pain bilaterally. Hip abduction strength is slightly diminished right compared to left with reproducing pain with resisted hip abduction. Mild antalgic gait present.  No results found for this or any previous visit (from  the past 24 hour(s)). No results found.  Impression and Recommendations:   This case required medical decision making of moderate complexity.

## 2015-07-16 NOTE — Patient Instructions (Signed)
Thank you for coming in today. Attend PT.  Continue ibuprofen.  Use heating pad.  TENS UNIT: This is helpful for muscle pain and spasm.   Search and Purchase a TENS 7000 2nd edition at www.tenspros.com. It should be less than $30.     TENS unit instructions: Do not shower or bathe with the unit on Turn the unit off before removing electrodes or batteries If the electrodes lose stickiness add a drop of water to the electrodes after they are disconnected from the unit and place on plastic sheet. If you continued to have difficulty, call the TENS unit company to purchase more electrodes. Do not apply lotion on the skin area prior to use. Make sure the skin is clean and dry as this will help prolong the life of the electrodes. After use, always check skin for unusual red areas, rash or other skin difficulties. If there are any skin problems, does not apply electrodes to the same area. Never remove the electrodes from the unit by pulling the wires. Do not use the TENS unit or electrodes other than as directed. Do not change electrode placement without consultating your therapist or physician. Keep 2 fingers with between each electrode. Wear time ratio is 2:1, on to off times.    For example on for 30 minutes off for 15 minutes and then on for 30 minutes off for 15 minutes   Return in 2-4 weeks.   Hip Bursitis Bursitis is a swelling and soreness (inflammation) of a fluid-filled sac (bursa). This sac overlies and protects the joints.  CAUSES   Injury.  Overuse of the muscles surrounding the joint.  Arthritis.  Gout.  Infection.  Cold weather.  Inadequate warm-up and conditioning prior to activities. The cause may not be known.  SYMPTOMS   Mild to severe irritation.  Tenderness and swelling over the outside of the hip.  Pain with motion of the hip.  If the bursa becomes infected, a fever may be present. Redness, tenderness, and warmth will develop over the hip. Symptoms  usually lessen in 3 to 4 weeks with treatment, but can come back. TREATMENT If conservative treatment does not work, your caregiver may advise draining the bursa and injecting cortisone into the area. This may speed up the healing process. This may also be used as an initial treatment of choice. HOME CARE INSTRUCTIONS   Apply ice to the affected area for 15-20 minutes every 3 to 4 hours while awake for the first 2 days. Put the ice in a plastic bag and place a towel between the bag of ice and your skin.  Rest the painful joint as much as possible, but continue to put the joint through a normal range of motion at least 4 times per day. When the pain lessens, begin normal, slow movements and usual activities to help prevent stiffness of the hip.  Only take over-the-counter or prescription medicines for pain, discomfort, or fever as directed by your caregiver.  Use crutches to limit weight bearing on the hip joint, if advised.  Elevate your painful hip to reduce swelling. Use pillows for propping and cushioning your legs and hips.  Gentle massage may provide comfort and decrease swelling. SEEK IMMEDIATE MEDICAL CARE IF:   Your pain increases even during treatment, or you are not improving.  You have a fever.  You have heat and inflammation over the involved bursa.  You have any other questions or concerns. MAKE SURE YOU:   Understand these instructions.  Will  watch your condition.  Will get help right away if you are not doing well or get worse.   This information is not intended to replace advice given to you by your health care provider. Make sure you discuss any questions you have with your health care provider.   Document Released: 09/30/2001 Document Revised: 07/03/2011 Document Reviewed: 11/10/2014 Elsevier Interactive Patient Education [employer]

## 2015-07-19 ENCOUNTER — Encounter: Payer: Self-pay | Admitting: Family Medicine

## 2015-08-04 ENCOUNTER — Ambulatory Visit: Payer: Medicare Other | Admitting: Family Medicine

## 2016-01-05 ENCOUNTER — Other Ambulatory Visit: Payer: Self-pay | Admitting: Obstetrics and Gynecology

## 2016-01-05 DIAGNOSIS — Z1231 Encounter for screening mammogram for malignant neoplasm of breast: Secondary | ICD-10-CM | POA: Diagnosis not present

## 2016-01-05 DIAGNOSIS — Z01419 Encounter for gynecological examination (general) (routine) without abnormal findings: Secondary | ICD-10-CM | POA: Diagnosis not present

## 2016-01-07 LAB — CYTOLOGY - PAP

## 2017-03-09 DIAGNOSIS — Z23 Encounter for immunization: Secondary | ICD-10-CM | POA: Diagnosis not present

## 2017-04-04 ENCOUNTER — Ambulatory Visit: Payer: Medicare Other | Admitting: Family Medicine

## 2019-03-25 ENCOUNTER — Other Ambulatory Visit: Payer: Self-pay | Admitting: Internal Medicine

## 2019-03-25 DIAGNOSIS — Z1231 Encounter for screening mammogram for malignant neoplasm of breast: Secondary | ICD-10-CM

## 2022-02-20 ENCOUNTER — Ambulatory Visit (INDEPENDENT_AMBULATORY_CARE_PROVIDER_SITE_OTHER): Payer: Medicare Other | Admitting: Family Medicine

## 2022-02-20 ENCOUNTER — Encounter: Payer: Self-pay | Admitting: Family Medicine

## 2022-02-20 VITALS — BP 156/84 | HR 96 | Ht 65.0 in | Wt 261.0 lb

## 2022-02-20 DIAGNOSIS — Z23 Encounter for immunization: Secondary | ICD-10-CM

## 2022-02-20 DIAGNOSIS — R0981 Nasal congestion: Secondary | ICD-10-CM | POA: Diagnosis not present

## 2022-02-20 MED ORDER — FLUTICASONE PROPIONATE 50 MCG/ACT NA SUSP: 2.0000 | Freq: Every day | NASAL | 6 refills | Status: DC

## 2022-02-20 MED ORDER — CETIRIZINE HCL 10 MG PO TABS: 10.0000 mg | ORAL_TABLET | Freq: Every day | ORAL | 3 refills | Status: DC

## 2022-02-20 NOTE — Patient Instructions (Signed)
Take a daily zyrtec (cetirizine) 10mg  tablet  Use flonase nasal spray with cross treatment (point to the opposite ear/eye)  You will be referred to ENT and someone will reach out to you.

## 2022-02-20 NOTE — Assessment & Plan Note (Addendum)
-   erythema of nasal passages noted on exam Take a daily zyrtec (cetirizine) 10mg  tablet  Use flonase nasal spray with cross treatment (point to the opposite ear/eye) will refer to ENT as she may have a clogged sinus passage.

## 2022-02-20 NOTE — Progress Notes (Signed)
New Patient Office Visit  Subjective    Patient ID: Megan Walter, female    DOB: 1946/02/26  Age: 76 y.o. MRN: [id number]  CC:  Chief Complaint  Patient presents with   Nasal Congestion    HPI Megan Walter presents to establish care. She has concerns today of nasal issues. She has been feeling recurrent congestion. She did have a history of affrin use but stopped using it two-three months. She denies sinus pressure or pain. She says she feels like something is "stopped up in her right nasal passage."  Outpatient Encounter Medications as of 02/20/2022  Medication Sig   calcium citrate (CALCITRATE - DOSED IN MG ELEMENTAL CALCIUM) 950 MG tablet Take 1 tablet by mouth daily.   cetirizine (ZYRTEC) 10 MG tablet Take 1 tablet (10 mg total) by mouth daily.   cholecalciferol (VITAMIN D) 1000 UNITS tablet Take 1,000 Units by mouth daily.   fluticasone (FLONASE) 50 MCG/ACT nasal spray Place 2 sprays into both nostrils daily.   nitrofurantoin (MACRODANTIN) 100 MG capsule Take 100 mg by mouth 2 (two) times daily.   [DISCONTINUED] cetirizine (ZYRTEC) 10 MG tablet Take 1 tablet (10 mg total) by mouth daily.   [DISCONTINUED] fluticasone (FLONASE) 50 MCG/ACT nasal spray Place 2 sprays into both nostrils daily.   [DISCONTINUED] escitalopram (LEXAPRO) 10 MG tablet TAKE 1 TABLET BY MOUTH EVERY DAY (Patient not taking: Reported on 02/20/2022)   [DISCONTINUED] oseltamivir (TAMIFLU) 75 MG capsule Take 1 capsule (75 mg total) by mouth daily. (Patient not taking: Reported on 02/20/2022)   [DISCONTINUED] zoster vaccine live, PF, (ZOSTAVAX) 51025 UNT/0.65ML injection Inject 19,400 Units into the skin once. (Patient not taking: Reported on 02/20/2022)   No facility-administered encounter medications on file as of 02/20/2022.    Past Medical History:  Diagnosis Date   Tibial plateau fracture 2005   Right     No past surgical history on file.  No family history on file.  Social History    Socioeconomic History   Marital status: Married    Spouse name: Not on file   Number of children: Not on file   Years of education: Not on file   Highest education level: Not on file  Occupational History   Not on file  Tobacco Use   Smoking status: Never   Smokeless tobacco: Not on file  Substance and Sexual Activity   Alcohol use: No   Drug use: No   Sexual activity: Not on file  Other Topics Concern   Not on file  Social History Narrative   Not on file   Social Determinants of Health   Financial Resource Strain: Not on file  Food Insecurity: Not on file  Transportation Needs: Not on file  Physical Activity: Not on file  Stress: Not on file  Social Connections: Not on file  Intimate Partner Violence: Not on file    Review of Systems  Constitutional:  Negative for chills and fever.  HENT:  Positive for congestion.   Respiratory:  Negative for cough and shortness of breath.   Cardiovascular:  Negative for chest pain.  Neurological:  Negative for headaches.        Objective    BP (!) 156/84   Pulse 96   Ht 5\' 5"  (1.651 m)   Wt 261 lb (118.4 kg)   SpO2 98%   BMI 43.43 kg/m   Physical Exam Vitals and nursing note reviewed.  Constitutional:      General: She is not in  acute distress.    Appearance: Normal appearance.  HENT:     Head: Normocephalic and atraumatic.     Comments: No tenderness to palpation of frontal or maxillary sinuses bilaterally    Right Ear: External ear normal.     Left Ear: External ear normal.     Nose: Nose normal.     Comments: Erythema nasal passage Eyes:     Conjunctiva/sclera: Conjunctivae normal.  Cardiovascular:     Rate and Rhythm: Normal rate.  Pulmonary:     Effort: Pulmonary effort is normal.  Neurological:     General: No focal deficit present.     Mental Status: She is alert and oriented to person, place, and time.  Psychiatric:        Mood and Affect: Mood normal.        Behavior: Behavior normal.         Thought Content: Thought content normal.        Judgment: Judgment normal.       Assessment & Plan:   Problem List Items Addressed This Visit       Other   Nasal congestion    - erythema of nasal passages noted on exam Take a daily zyrtec (cetirizine) 10mg  tablet  Use flonase nasal spray with cross treatment (point to the opposite ear/eye) will refer to ENT as she may have a clogged sinus passage.       Relevant Medications   cetirizine (ZYRTEC) 10 MG tablet   fluticasone (FLONASE) 50 MCG/ACT nasal spray   Other Relevant Orders   Ambulatory referral to ENT   Other Visit Diagnoses     Need for influenza vaccination    -  Primary   Relevant Orders   Flu Vaccine QUAD High Dose(Fluad) (Completed)       Return if symptoms worsen or fail to improve.   Owens Loffler, DO

## 2022-02-21 ENCOUNTER — Encounter: Payer: Self-pay | Admitting: Family Medicine

## 2022-03-21 DIAGNOSIS — J324 Chronic pansinusitis: Secondary | ICD-10-CM | POA: Diagnosis not present

## 2022-03-21 DIAGNOSIS — J3489 Other specified disorders of nose and nasal sinuses: Secondary | ICD-10-CM | POA: Diagnosis not present

## 2022-05-26 ENCOUNTER — Ambulatory Visit: Payer: Medicare Other | Admitting: Family Medicine

## 2022-06-14 ENCOUNTER — Other Ambulatory Visit: Payer: Self-pay

## 2022-06-14 ENCOUNTER — Inpatient Hospital Stay (HOSPITAL_BASED_OUTPATIENT_CLINIC_OR_DEPARTMENT_OTHER)
Admission: EM | Admit: 2022-06-14 | Discharge: 2022-06-26 | DRG: 493 | Disposition: A | Discharge status: Rehabilitation Facility | Payer: Medicare Other | Attending: Internal Medicine | Admitting: Internal Medicine

## 2022-06-14 ENCOUNTER — Emergency Department (HOSPITAL_BASED_OUTPATIENT_CLINIC_OR_DEPARTMENT_OTHER): Payer: Medicare Other | Admitting: Radiology

## 2022-06-14 DIAGNOSIS — M810 Age-related osteoporosis without current pathological fracture: Secondary | ICD-10-CM | POA: Diagnosis not present

## 2022-06-14 DIAGNOSIS — S82192A Other fracture of upper end of left tibia, initial encounter for closed fracture: Secondary | ICD-10-CM

## 2022-06-14 DIAGNOSIS — R269 Unspecified abnormalities of gait and mobility: Secondary | ICD-10-CM | POA: Diagnosis not present

## 2022-06-14 DIAGNOSIS — I700 Atherosclerosis of aorta: Secondary | ICD-10-CM | POA: Diagnosis not present

## 2022-06-14 DIAGNOSIS — E8809 Other disorders of plasma-protein metabolism, not elsewhere classified: Secondary | ICD-10-CM | POA: Diagnosis not present

## 2022-06-14 DIAGNOSIS — D62 Acute posthemorrhagic anemia: Secondary | ICD-10-CM | POA: Diagnosis not present

## 2022-06-14 DIAGNOSIS — M80832A Other osteoporosis with current pathological fracture, left forearm, initial encounter for fracture: Secondary | ICD-10-CM | POA: Diagnosis present

## 2022-06-14 DIAGNOSIS — W010XXD Fall on same level from slipping, tripping and stumbling without subsequent striking against object, subsequent encounter: Secondary | ICD-10-CM | POA: Diagnosis not present

## 2022-06-14 DIAGNOSIS — S62102A Fracture of unspecified carpal bone, left wrist, initial encounter for closed fracture: Secondary | ICD-10-CM | POA: Diagnosis present

## 2022-06-14 DIAGNOSIS — W109XXA Fall (on) (from) unspecified stairs and steps, initial encounter: Secondary | ICD-10-CM | POA: Diagnosis present

## 2022-06-14 DIAGNOSIS — R17 Unspecified jaundice: Secondary | ICD-10-CM | POA: Diagnosis not present

## 2022-06-14 DIAGNOSIS — M80862A Other osteoporosis with current pathological fracture, left lower leg, initial encounter for fracture: Principal | ICD-10-CM | POA: Diagnosis present

## 2022-06-14 DIAGNOSIS — S82252A Displaced comminuted fracture of shaft of left tibia, initial encounter for closed fracture: Secondary | ICD-10-CM | POA: Diagnosis not present

## 2022-06-14 DIAGNOSIS — I890 Lymphedema, not elsewhere classified: Secondary | ICD-10-CM | POA: Diagnosis not present

## 2022-06-14 DIAGNOSIS — R5381 Other malaise: Secondary | ICD-10-CM | POA: Diagnosis not present

## 2022-06-14 DIAGNOSIS — S82142A Displaced bicondylar fracture of left tibia, initial encounter for closed fracture: Secondary | ICD-10-CM | POA: Diagnosis not present

## 2022-06-14 DIAGNOSIS — S82202A Unspecified fracture of shaft of left tibia, initial encounter for closed fracture: Secondary | ICD-10-CM | POA: Diagnosis not present

## 2022-06-14 DIAGNOSIS — Y92009 Unspecified place in unspecified non-institutional (private) residence as the place of occurrence of the external cause: Secondary | ICD-10-CM

## 2022-06-14 DIAGNOSIS — E559 Vitamin D deficiency, unspecified: Secondary | ICD-10-CM | POA: Diagnosis not present

## 2022-06-14 DIAGNOSIS — G8918 Other acute postprocedural pain: Secondary | ICD-10-CM | POA: Diagnosis not present

## 2022-06-14 DIAGNOSIS — R739 Hyperglycemia, unspecified: Secondary | ICD-10-CM | POA: Diagnosis not present

## 2022-06-14 DIAGNOSIS — S82452A Displaced comminuted fracture of shaft of left fibula, initial encounter for closed fracture: Secondary | ICD-10-CM | POA: Diagnosis not present

## 2022-06-14 DIAGNOSIS — S82102A Unspecified fracture of upper end of left tibia, initial encounter for closed fracture: Secondary | ICD-10-CM | POA: Diagnosis not present

## 2022-06-14 DIAGNOSIS — Z9889 Other specified postprocedural states: Secondary | ICD-10-CM | POA: Diagnosis not present

## 2022-06-14 DIAGNOSIS — W19XXXA Unspecified fall, initial encounter: Secondary | ICD-10-CM | POA: Diagnosis not present

## 2022-06-14 DIAGNOSIS — E875 Hyperkalemia: Secondary | ICD-10-CM | POA: Diagnosis not present

## 2022-06-14 DIAGNOSIS — D649 Anemia, unspecified: Secondary | ICD-10-CM | POA: Diagnosis not present

## 2022-06-14 DIAGNOSIS — M6282 Rhabdomyolysis: Secondary | ICD-10-CM | POA: Diagnosis present

## 2022-06-14 DIAGNOSIS — Z6841 Body Mass Index (BMI) 40.0 and over, adult: Secondary | ICD-10-CM | POA: Diagnosis not present

## 2022-06-14 DIAGNOSIS — B349 Viral infection, unspecified: Secondary | ICD-10-CM | POA: Diagnosis not present

## 2022-06-14 DIAGNOSIS — S82142D Displaced bicondylar fracture of left tibia, subsequent encounter for closed fracture with routine healing: Secondary | ICD-10-CM | POA: Diagnosis not present

## 2022-06-14 DIAGNOSIS — R918 Other nonspecific abnormal finding of lung field: Secondary | ICD-10-CM | POA: Diagnosis not present

## 2022-06-14 DIAGNOSIS — S82832A Other fracture of upper and lower end of left fibula, initial encounter for closed fracture: Secondary | ICD-10-CM | POA: Diagnosis not present

## 2022-06-14 DIAGNOSIS — G4700 Insomnia, unspecified: Secondary | ICD-10-CM | POA: Diagnosis not present

## 2022-06-14 DIAGNOSIS — Z7901 Long term (current) use of anticoagulants: Secondary | ICD-10-CM | POA: Diagnosis not present

## 2022-06-14 DIAGNOSIS — S80919A Unspecified superficial injury of unspecified knee, initial encounter: Secondary | ICD-10-CM | POA: Diagnosis not present

## 2022-06-14 DIAGNOSIS — Z79899 Other long term (current) drug therapy: Secondary | ICD-10-CM | POA: Diagnosis not present

## 2022-06-14 DIAGNOSIS — T796XXS Traumatic ischemia of muscle, sequela: Secondary | ICD-10-CM | POA: Diagnosis not present

## 2022-06-14 DIAGNOSIS — R609 Edema, unspecified: Secondary | ICD-10-CM | POA: Diagnosis not present

## 2022-06-14 DIAGNOSIS — T1490XA Injury, unspecified, initial encounter: Secondary | ICD-10-CM | POA: Diagnosis not present

## 2022-06-14 DIAGNOSIS — S52502A Unspecified fracture of the lower end of left radius, initial encounter for closed fracture: Secondary | ICD-10-CM | POA: Diagnosis not present

## 2022-06-14 DIAGNOSIS — Z8249 Family history of ischemic heart disease and other diseases of the circulatory system: Secondary | ICD-10-CM | POA: Diagnosis not present

## 2022-06-14 DIAGNOSIS — M8000XA Age-related osteoporosis with current pathological fracture, unspecified site, initial encounter for fracture: Secondary | ICD-10-CM | POA: Diagnosis not present

## 2022-06-14 DIAGNOSIS — E876 Hypokalemia: Secondary | ICD-10-CM | POA: Diagnosis not present

## 2022-06-14 DIAGNOSIS — R9431 Abnormal electrocardiogram [ECG] [EKG]: Secondary | ICD-10-CM | POA: Diagnosis not present

## 2022-06-14 DIAGNOSIS — I10 Essential (primary) hypertension: Secondary | ICD-10-CM | POA: Diagnosis not present

## 2022-06-14 DIAGNOSIS — S52502D Unspecified fracture of the lower end of left radius, subsequent encounter for closed fracture with routine healing: Secondary | ICD-10-CM | POA: Diagnosis not present

## 2022-06-14 DIAGNOSIS — S52592A Other fractures of lower end of left radius, initial encounter for closed fracture: Secondary | ICD-10-CM | POA: Diagnosis not present

## 2022-06-14 DIAGNOSIS — S52572A Other intraarticular fracture of lower end of left radius, initial encounter for closed fracture: Secondary | ICD-10-CM | POA: Diagnosis not present

## 2022-06-14 DIAGNOSIS — M79652 Pain in left thigh: Secondary | ICD-10-CM | POA: Diagnosis not present

## 2022-06-14 DIAGNOSIS — S52615D Nondisplaced fracture of left ulna styloid process, subsequent encounter for closed fracture with routine healing: Secondary | ICD-10-CM | POA: Diagnosis not present

## 2022-06-14 DIAGNOSIS — S62102S Fracture of unspecified carpal bone, left wrist, sequela: Secondary | ICD-10-CM | POA: Diagnosis present

## 2022-06-14 DIAGNOSIS — M25562 Pain in left knee: Secondary | ICD-10-CM | POA: Diagnosis not present

## 2022-06-14 LAB — BASIC METABOLIC PANEL
Anion gap: 11 (ref 5–15)
BUN: 28 mg/dL — ABNORMAL HIGH (ref 8–23)
CO2: 24 mmol/L (ref 22–32)
Calcium: 9.3 mg/dL (ref 8.9–10.3)
Chloride: 103 mmol/L (ref 98–111)
Creatinine, Ser: 0.56 mg/dL (ref 0.44–1.00)
GFR, Estimated: >60 mL/min (ref >60)
Glucose, Bld: 116 mg/dL — ABNORMAL HIGH (ref 70–99)
Potassium: 5.2 mmol/L — ABNORMAL HIGH (ref 3.5–5.1)
Sodium: 138 mmol/L (ref 135–145)

## 2022-06-14 LAB — CBC WITH DIFFERENTIAL/PLATELET
Abs Immature Granulocytes: 0.08 10*3/uL — ABNORMAL HIGH (ref 0.00–0.07)
Basophils Absolute: 0 10*3/uL (ref 0.0–0.1)
Basophils Relative: 0 %
Eosinophils Absolute: 0 10*3/uL (ref 0.0–0.5)
Eosinophils Relative: 0 %
HCT: 40.5 % (ref 36.0–46.0)
Hemoglobin: 13.6 g/dL (ref 12.0–15.0)
Immature Granulocytes: 1 %
Lymphocytes Relative: 5 %
Lymphs Abs: 0.8 10*3/uL (ref 0.7–4.0)
MCH: 28.6 pg (ref 26.0–34.0)
MCHC: 33.6 g/dL (ref 30.0–36.0)
MCV: 85.3 fL (ref 80.0–100.0)
Monocytes Absolute: 1.1 10*3/uL — ABNORMAL HIGH (ref 0.1–1.0)
Monocytes Relative: 6 %
Neutro Abs: 15.7 10*3/uL — ABNORMAL HIGH (ref 1.7–7.7)
Neutrophils Relative %: 88 %
Platelets: 249 10*3/uL (ref 150–400)
RBC: 4.75 MIL/uL (ref 3.87–5.11)
RDW: 13.5 % (ref 11.5–15.5)
WBC: 17.7 10*3/uL — ABNORMAL HIGH (ref 4.0–10.5)
nRBC: 0 % (ref 0.0–0.2)

## 2022-06-14 LAB — PROTIME-INR
INR: 1 (ref 0.8–1.2)
Prothrombin Time: 12.9 seconds (ref 11.4–15.2)

## 2022-06-14 MED ORDER — HYDROMORPHONE HCL 1 MG/ML IJ SOLN
1.0000 mg | Freq: Once | INTRAMUSCULAR | Status: AC
  Administered 2022-06-14: 1 mg via INTRAVENOUS
  Filled 2022-06-14: qty 1

## 2022-06-14 MED ORDER — HYDROMORPHONE HCL 1 MG/ML IJ SOLN
1.0000 mg | INTRAMUSCULAR | Status: DC | PRN
  Administered 2022-06-15 – 2022-06-16 (×6): 1 mg via INTRAVENOUS
  Filled 2022-06-14 (×8): qty 1

## 2022-06-14 MED ORDER — LACTATED RINGERS IV SOLN: INTRAVENOUS | Status: DC

## 2022-06-14 NOTE — Progress Notes (Signed)
Plan of Care Note for accepted transfer   Patient: Megan Walter MRN: [id number]   DOA: 06/14/2022  Facility requesting transfer: [facility] Requesting Provider: Dr. Vallery Ridge, [place] Reason for transfer: Left tibial plateau fracture, left wrist fracture. Facility course: The patient is a 77 year old female with past medical history significant for osteoporosis who presented to [facility] from home via EMS after a mechanical fall.    Imaging in the ED revealed left tibial plateau fracture and left wrist fracture.  EDP discussed the case with orthopedic surgery Dr. Doreatha Martin.  Recommended to make n.p.o. after midnight.  Possible surgical intervention tomorrow or Friday.  The patient was admitted at [facility] medical surgical unit as inpatient status.  Plan of care: The patient is accepted for admission to Telemetry unit, at [facility].  Inpatient status.  Author: Kayleen Memos, DO 06/14/2022  Check [email or web address] for on-call coverage.  Nursing staff, Please call [place] number on Amion as soon as patient's arrival, so appropriate admitting provider can evaluate the pt.

## 2022-06-14 NOTE — ED Provider Notes (Signed)
[facility] Provider Note   CSN: [id number] Arrival date & time: 06/14/22  1730     History {Add pertinent medical, surgical, social history, OB history to HPI:1} Chief Complaint  Patient presents with   Megan Walter is a 76 y.o. female.  HPI Patient was going down the stairs in her garage.  She missed stepped and ended up falling.  Patient denies that she struck her head.  She reports that her left knee had severe pain and she was unable to use it.  She also has severe pain and deformity of the left wrist.  She reports due to these injury she was unable to get back up and had to wait until her grandson found her around 5 PM.  She reports however that these were the only areas of pain.  He does not take any anticoagulants.  She reports she has had to have prior wrist surgery by Dr. Veronia Beets and her orthopedic group is [facility].    Home Medications Prior to Admission medications   Medication Sig Start Date End Date Taking? Authorizing Provider  calcium citrate (CALCITRATE - DOSED IN MG ELEMENTAL CALCIUM) 950 MG tablet Take 1 tablet by mouth daily.    [provider]  cetirizine (ZYRTEC) 10 MG tablet Take 1 tablet (10 mg total) by mouth daily. 02/20/22   Owens Loffler, DO  cholecalciferol (VITAMIN D) 1000 UNITS tablet Take 1,000 Units by mouth daily.    [provider]  fluticasone (FLONASE) 50 MCG/ACT nasal spray Place 2 sprays into both nostrils daily. 02/20/22   Owens Loffler, DO  nitrofurantoin (MACRODANTIN) 100 MG capsule Take 100 mg by mouth 2 (two) times daily.    [provider]      Allergies    Patient has no known allergies.    Review of Systems   Review of Systems  Physical Exam Updated Vital Signs BP (!) 162/84   Pulse 97   Temp 97.7 F (36.5 C) (Oral)   Resp 17   Ht 5' 5"$  (1.651 m)   Wt 122.5 kg   SpO2 98%   BMI 44.93 kg/m  Physical Exam Constitutional:       Comments: Alert nontoxic no respiratory distress.  HENT:     Head: Normocephalic and atraumatic.     Mouth/Throat:     Pharynx: Oropharynx is clear.  Eyes:     Extraocular Movements: Extraocular movements intact.  Cardiovascular:     Rate and Rhythm: Normal rate and regular rhythm.  Pulmonary:     Effort: Pulmonary effort is normal.     Breath sounds: Normal breath sounds.  Chest:     Chest wall: No tenderness.  Abdominal:     General: There is no distension.     Palpations: Abdomen is soft.     Tenderness: There is no abdominal tenderness. There is no guarding.  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  Radial pulse is 2+ and strong.  Patient can move her digits without difficulty.  Hand is warm and dry.  She does not have deformity or pain with range of motion of the elbow or the shoulder.  Patient has pain below the left knee.  Very tender to any movement at the knee or the lower leg.  No apparent deformity at the ankle and the foot.  Ankle and foot are warm and dry with 2+ dorsalis pedis pulse (  medics had marked a pulse very distal on the foot by the toes, pulse I obtained is over the arch close to the ankle).  Skin:    General: Skin is warm and dry.  Neurological:     General: No focal deficit present.     Mental Status: She is oriented to person, place, and time.     Coordination: Coordination normal.  Psychiatric:        Mood and Affect: Mood normal.     ED Results / Procedures / Treatments   Labs (all labs ordered are listed, but only abnormal results are displayed) Labs Reviewed  BASIC METABOLIC PANEL  CBC WITH DIFFERENTIAL/PLATELET  PROTIME-INR    EKG None  Radiology DG Tibia/Fibula Left  Result Date: 06/14/2022 CLINICAL DATA:  Pain and limited range of motion after a fall. EXAM: LEFT TIBIA AND FIBULA - 2 VIEW COMPARISON:  None Available. FINDINGS: Comminuted and displaced fractures are demonstrated involving the proximal tibial  metaphysis with fracture lines involving the central metaphysis and extending to the central aspects of the medial and lateral tibial plateaus at the base of the tibial spines. There is complete lateral displacement of the lateral tibial plateau with respect to the distal femur. There is a comminuted mostly transverse fracture of the proximal fibular shaft with mild lateral displacement and anterior displacement of the distal fracture fragment. There is associated soft tissue swelling and a large left knee effusion. Old internal fixation and degenerative changes are demonstrated in the ankle joint. IMPRESSION: 1. Comminuted and displaced fractures of the proximal tibial metaphysis with involvement of the medial and lateral tibial plateau and base of the tibial spines. Lateral dislocation of the lateral tibial plateau with respect to the femur. 2. Mostly transverse comminuted and mildly displaced fractures of the proximal fibular shaft. Electronically Signed   By: Lucienne Capers M.D.   On: 06/14/2022 20:20   DG FEMUR MIN 2 VIEWS LEFT  Result Date: 06/14/2022 CLINICAL DATA:  Pain and limited range of motion after a fall. EXAM: LEFT FEMUR 2 VIEWS COMPARISON:  None Available. FINDINGS: Multiple images of the left femur excluding the left knee. Visualized left hip and proximal femur appear intact. No evidence of dislocation at the hip joint. No acute fractures are demonstrated in the visualized portion. Soft tissues are unremarkable. IMPRESSION: No acute bony abnormalities demonstrated in the left hip and proximal femur. Electronically Signed   By: Lucienne Capers M.D.   On: 06/14/2022 20:15   DG Wrist Complete Left  Result Date: 06/14/2022 CLINICAL DATA:  Pain and limited range of motion after a fall. EXAM: LEFT WRIST - COMPLETE 3+ VIEW COMPARISON:  None Available. FINDINGS: Comminuted and impacted fractures of the distal left radius with fracture lines extending to the radiocarpal and radioulnar joint.  Nondisplaced fracture of the ulnar styloid process. Degenerative changes in the STT and first carpometacarpal joints as well as in the visualized interphalangeal joints. Severe dorsal soft tissue swelling over the wrist and distal forearm. IMPRESSION: Comminuted and impacted fractures of the distal left radius with nondisplaced fracture of the ulnar styloid process. Electronically Signed   By: Lucienne Capers M.D.   On: 06/14/2022 20:14    Procedures Procedures  {Document cardiac monitor, telemetry assessment procedure when appropriate:1}  Medications Ordered in ED Medications  HYDROmorphone (DILAUDID) injection 1 mg (1 mg Intravenous Given 06/14/22 1903)    ED Course/ Medical Decision Making/ A&P   {   Click here for ABCD2, HEART and other calculatorsREFRESH Note  before signing :1}                          Medical Decision Making  ***  {Document critical care time when appropriate:1} {Document review of labs and clinical decision tools ie heart score, Chads2Vasc2 etc:1}  {Document your independent review of radiology images, and any outside records:1} {Document your discussion with family members, caretakers, and with consultants:1} {Document social determinants of health affecting pt's care:1} {Document your decision making why or why not admission, treatments were needed:1} Final Clinical Impression(s) / ED Diagnoses Final diagnoses:  Closed fracture of left wrist, initial encounter  Other closed fracture of proximal end of left tibia, initial encounter    Rx / DC Orders ED Discharge Orders     None

## 2022-06-14 NOTE — ED Triage Notes (Signed)
Pt bib EMS after falling around 11am. Pt walking down steps in garage when she missed a step. Unable to get up due to pain in L knee, found by grandson around 5pm. L wrist + knee swelling noted. Pulses present in all extremities, CNS intact. Denies LOC, no blood thinners, denies hitting head.

## 2022-06-14 NOTE — ED Notes (Signed)
Patient transported to X-ray 

## 2022-06-15 ENCOUNTER — Emergency Department (HOSPITAL_BASED_OUTPATIENT_CLINIC_OR_DEPARTMENT_OTHER): Payer: Medicare Other

## 2022-06-15 ENCOUNTER — Inpatient Hospital Stay (HOSPITAL_COMMUNITY): Payer: Medicare Other

## 2022-06-15 ENCOUNTER — Encounter (HOSPITAL_COMMUNITY): Payer: Self-pay | Admitting: Internal Medicine

## 2022-06-15 DIAGNOSIS — S62102A Fracture of unspecified carpal bone, left wrist, initial encounter for closed fracture: Secondary | ICD-10-CM | POA: Diagnosis not present

## 2022-06-15 DIAGNOSIS — R17 Unspecified jaundice: Secondary | ICD-10-CM | POA: Diagnosis not present

## 2022-06-15 DIAGNOSIS — S62102S Fracture of unspecified carpal bone, left wrist, sequela: Secondary | ICD-10-CM | POA: Diagnosis present

## 2022-06-15 DIAGNOSIS — T796XXS Traumatic ischemia of muscle, sequela: Secondary | ICD-10-CM | POA: Diagnosis not present

## 2022-06-15 DIAGNOSIS — D649 Anemia, unspecified: Secondary | ICD-10-CM | POA: Diagnosis not present

## 2022-06-15 DIAGNOSIS — Y92009 Unspecified place in unspecified non-institutional (private) residence as the place of occurrence of the external cause: Secondary | ICD-10-CM

## 2022-06-15 DIAGNOSIS — Z6841 Body Mass Index (BMI) 40.0 and over, adult: Secondary | ICD-10-CM | POA: Diagnosis not present

## 2022-06-15 DIAGNOSIS — R9431 Abnormal electrocardiogram [ECG] [EKG]: Secondary | ICD-10-CM | POA: Diagnosis present

## 2022-06-15 DIAGNOSIS — M8000XA Age-related osteoporosis with current pathological fracture, unspecified site, initial encounter for fracture: Secondary | ICD-10-CM

## 2022-06-15 DIAGNOSIS — R269 Unspecified abnormalities of gait and mobility: Secondary | ICD-10-CM | POA: Diagnosis present

## 2022-06-15 DIAGNOSIS — T1490XA Injury, unspecified, initial encounter: Secondary | ICD-10-CM | POA: Diagnosis not present

## 2022-06-15 DIAGNOSIS — S52502D Unspecified fracture of the lower end of left radius, subsequent encounter for closed fracture with routine healing: Secondary | ICD-10-CM | POA: Diagnosis not present

## 2022-06-15 DIAGNOSIS — E559 Vitamin D deficiency, unspecified: Secondary | ICD-10-CM | POA: Diagnosis not present

## 2022-06-15 DIAGNOSIS — Z8249 Family history of ischemic heart disease and other diseases of the circulatory system: Secondary | ICD-10-CM | POA: Diagnosis not present

## 2022-06-15 DIAGNOSIS — D62 Acute posthemorrhagic anemia: Secondary | ICD-10-CM | POA: Diagnosis not present

## 2022-06-15 DIAGNOSIS — Z79899 Other long term (current) drug therapy: Secondary | ICD-10-CM | POA: Diagnosis not present

## 2022-06-15 DIAGNOSIS — E875 Hyperkalemia: Secondary | ICD-10-CM | POA: Diagnosis present

## 2022-06-15 DIAGNOSIS — S82202A Unspecified fracture of shaft of left tibia, initial encounter for closed fracture: Secondary | ICD-10-CM | POA: Diagnosis present

## 2022-06-15 DIAGNOSIS — W19XXXA Unspecified fall, initial encounter: Secondary | ICD-10-CM | POA: Diagnosis not present

## 2022-06-15 DIAGNOSIS — W109XXA Fall (on) (from) unspecified stairs and steps, initial encounter: Secondary | ICD-10-CM | POA: Diagnosis present

## 2022-06-15 DIAGNOSIS — I890 Lymphedema, not elsewhere classified: Secondary | ICD-10-CM | POA: Diagnosis present

## 2022-06-15 DIAGNOSIS — S82452A Displaced comminuted fracture of shaft of left fibula, initial encounter for closed fracture: Secondary | ICD-10-CM | POA: Diagnosis not present

## 2022-06-15 DIAGNOSIS — M6282 Rhabdomyolysis: Secondary | ICD-10-CM | POA: Diagnosis present

## 2022-06-15 DIAGNOSIS — S82102A Unspecified fracture of upper end of left tibia, initial encounter for closed fracture: Secondary | ICD-10-CM | POA: Diagnosis not present

## 2022-06-15 DIAGNOSIS — S52572A Other intraarticular fracture of lower end of left radius, initial encounter for closed fracture: Secondary | ICD-10-CM | POA: Diagnosis not present

## 2022-06-15 DIAGNOSIS — M80832A Other osteoporosis with current pathological fracture, left forearm, initial encounter for fracture: Secondary | ICD-10-CM | POA: Diagnosis present

## 2022-06-15 DIAGNOSIS — S82142A Displaced bicondylar fracture of left tibia, initial encounter for closed fracture: Secondary | ICD-10-CM | POA: Diagnosis not present

## 2022-06-15 DIAGNOSIS — S82252A Displaced comminuted fracture of shaft of left tibia, initial encounter for closed fracture: Secondary | ICD-10-CM | POA: Diagnosis not present

## 2022-06-15 DIAGNOSIS — M80862A Other osteoporosis with current pathological fracture, left lower leg, initial encounter for fracture: Secondary | ICD-10-CM | POA: Diagnosis present

## 2022-06-15 LAB — TYPE AND SCREEN
ABO/RH(D): O POS
Antibody Screen: NEGATIVE

## 2022-06-15 LAB — BASIC METABOLIC PANEL
Anion gap: 10 (ref 5–15)
BUN: 30 mg/dL — ABNORMAL HIGH (ref 8–23)
CO2: 23 mmol/L (ref 22–32)
Calcium: 8.8 mg/dL — ABNORMAL LOW (ref 8.9–10.3)
Chloride: 104 mmol/L (ref 98–111)
Creatinine, Ser: 0.63 mg/dL (ref 0.44–1.00)
GFR, Estimated: >60 mL/min (ref >60)
Glucose, Bld: 122 mg/dL — ABNORMAL HIGH (ref 70–99)
Potassium: 3.7 mmol/L (ref 3.5–5.1)
Sodium: 137 mmol/L (ref 135–145)

## 2022-06-15 LAB — SURGICAL PCR SCREEN
MRSA, PCR: NEGATIVE
Staphylococcus aureus: NEGATIVE

## 2022-06-15 LAB — CK: Total CK: 1080 U/L — ABNORMAL HIGH (ref 38–234)

## 2022-06-15 LAB — ABO/RH: ABO/RH(D): O POS

## 2022-06-15 MED ORDER — LORATADINE 10 MG PO TABS: 10.0000 mg | ORAL_TABLET | Freq: Every day | ORAL | Status: DC | PRN

## 2022-06-15 MED ORDER — SENNOSIDES-DOCUSATE SODIUM 8.6-50 MG PO TABS
1.0000 | ORAL_TABLET | Freq: Every day | ORAL | Status: DC
  Administered 2022-06-15 – 2022-06-25 (×3): 1 via ORAL
  Filled 2022-06-15 (×6): qty 1

## 2022-06-15 MED ORDER — ONDANSETRON HCL 4 MG/2ML IJ SOLN
4.0000 mg | Freq: Four times a day (QID) | INTRAMUSCULAR | Status: DC | PRN
  Administered 2022-06-16 – 2022-06-17 (×2): 4 mg via INTRAVENOUS
  Filled 2022-06-15 (×2): qty 2

## 2022-06-15 MED ORDER — CEFAZOLIN IN SODIUM CHLORIDE 3-0.9 GM/100ML-% IV SOLN
3.0000 g | INTRAVENOUS | Status: AC
  Administered 2022-06-16: 3 g via INTRAVENOUS
  Filled 2022-06-15: qty 100

## 2022-06-15 MED ORDER — MELATONIN 3 MG PO TABS
3.0000 mg | ORAL_TABLET | Freq: Every day | ORAL | Status: DC
  Administered 2022-06-15 – 2022-06-25 (×11): 3 mg via ORAL
  Filled 2022-06-15 (×11): qty 1

## 2022-06-15 MED ORDER — CALCIUM GLUCONATE-NACL 1-0.675 GM/50ML-% IV SOLN
1.0000 g | Freq: Once | INTRAVENOUS | Status: AC
  Administered 2022-06-15: 1000 mg via INTRAVENOUS
  Filled 2022-06-15: qty 50

## 2022-06-15 MED ORDER — ENOXAPARIN SODIUM 40 MG/0.4ML IJ SOSY
40.0000 mg | PREFILLED_SYRINGE | INTRAMUSCULAR | Status: DC
  Administered 2022-06-15 – 2022-06-17 (×2): 40 mg via SUBCUTANEOUS
  Filled 2022-06-15 (×2): qty 0.4

## 2022-06-15 MED ORDER — POVIDONE-IODINE 10 % EX SWAB: 2.0000 | Freq: Once | CUTANEOUS | Status: DC

## 2022-06-15 MED ORDER — TRIMETHOBENZAMIDE HCL 100 MG/ML IM SOLN
200.0000 mg | Freq: Four times a day (QID) | INTRAMUSCULAR | Status: DC | PRN
  Administered 2022-06-15: 200 mg via INTRAMUSCULAR
  Filled 2022-06-15 (×2): qty 2

## 2022-06-15 MED ORDER — FLUTICASONE PROPIONATE 50 MCG/ACT NA SUSP: 2.0000 | Freq: Every day | NASAL | Status: DC | PRN

## 2022-06-15 MED ORDER — TRANEXAMIC ACID-NACL 1000-0.7 MG/100ML-% IV SOLN
1000.0000 mg | INTRAVENOUS | Status: AC
  Administered 2022-06-16: 1000 mg via INTRAVENOUS
  Filled 2022-06-15: qty 100

## 2022-06-15 MED ORDER — METHOCARBAMOL 500 MG PO TABS
500.0000 mg | ORAL_TABLET | Freq: Four times a day (QID) | ORAL | Status: DC | PRN
  Administered 2022-06-16 – 2022-06-26 (×15): 500 mg via ORAL
  Filled 2022-06-15 (×15): qty 1

## 2022-06-15 MED ORDER — CHLORHEXIDINE GLUCONATE 4 % EX LIQD
60.0000 mL | Freq: Once | CUTANEOUS | Status: AC
  Administered 2022-06-16: 4 via TOPICAL
  Filled 2022-06-15: qty 15

## 2022-06-15 MED ORDER — METHOCARBAMOL 1000 MG/10ML IJ SOLN: 500.0000 mg | Freq: Four times a day (QID) | INTRAVENOUS | Status: DC | PRN

## 2022-06-15 MED ORDER — HYDROCODONE-ACETAMINOPHEN 5-325 MG PO TABS
1.0000 | ORAL_TABLET | Freq: Four times a day (QID) | ORAL | Status: DC | PRN
  Administered 2022-06-15 – 2022-06-16 (×2): 2 via ORAL
  Filled 2022-06-15 (×2): qty 2

## 2022-06-15 NOTE — H&P (View-Only) (Signed)
Reason for Consult:Left wrist/tibia plateau fxs Referring Physician: Fuller Plan Time called: [id number] Time at bedside: 0930   Megan Walter is an 77 y.o. female.  HPI: Megan Walter was going out her back door when she missed a step and fell. She had immediate left wrist and knee pain and could not get up. She was brought to the ED where x-rays showed a tibia plateau and wrist fx and orthopedic surgery was consulted. She lives at home with her husband and rarely uses any assistive devices.  Past Medical History:  Diagnosis Date   Tibial plateau fracture 2005   Right     Past Surgical History:  Procedure Laterality Date   ANKLE ARTHROCENTESIS     KNEE ARTHROCENTESIS      Family History  Problem Relation Age of Onset   Hypertension Mother    Heart attack Mother     Social History:  reports that she has never smoked. She does not have any smokeless tobacco history on file. She reports that she does not drink alcohol and does not use drugs.  Allergies: No Known Allergies  Medications: I have reviewed the patient's current medications.  Results for orders placed or performed during the hospital encounter of 06/14/22 (from the past 48 hour(s))  Basic metabolic panel     Status: Abnormal   Collection Time: 06/14/22  8:29 PM  Result Value Ref Range   Sodium 138 135 - 145 mmol/L   Potassium 5.2 (H) 3.5 - 5.1 mmol/L   Chloride 103 98 - 111 mmol/L   CO2 24 22 - 32 mmol/L   Glucose, Bld 116 (H) 70 - 99 mg/dL    Comment: Glucose reference range applies only to samples taken after fasting for at least 8 hours.   BUN 28 (H) 8 - 23 mg/dL   Creatinine, Ser 0.56 0.44 - 1.00 mg/dL   Calcium 9.3 8.9 - 10.3 mg/dL   GFR, Estimated >60 >60 mL/min    Comment: (NOTE) Calculated using the CKD-EPI Creatinine Equation (2021)    Anion gap 11 5 - 15    Comment: Performed at [facility], [address], [place], [place] [address]  CBC with Differential     Status: Abnormal    Collection Time: 06/14/22  8:29 PM  Result Value Ref Range   WBC 17.7 (H) 4.0 - 10.5 K/uL   RBC 4.75 3.87 - 5.11 MIL/uL   Hemoglobin 13.6 12.0 - 15.0 g/dL   HCT 40.5 36.0 - 46.0 %   MCV 85.3 80.0 - 100.0 fL   MCH 28.6 26.0 - 34.0 pg   MCHC 33.6 30.0 - 36.0 g/dL   RDW 13.5 11.5 - 15.5 %   Platelets 249 150 - 400 K/uL   nRBC 0.0 0.0 - 0.2 %   Neutrophils Relative % 88 %   Neutro Abs 15.7 (H) 1.7 - 7.7 K/uL   Lymphocytes Relative 5 %   Lymphs Abs 0.8 0.7 - 4.0 K/uL   Monocytes Relative 6 %   Monocytes Absolute 1.1 (H) 0.1 - 1.0 K/uL   Eosinophils Relative 0 %   Eosinophils Absolute 0.0 0.0 - 0.5 K/uL   Basophils Relative 0 %   Basophils Absolute 0.0 0.0 - 0.1 K/uL   Immature Granulocytes 1 %   Abs Immature Granulocytes 0.08 (H) 0.00 - 0.07 K/uL    Comment: Performed at [facility], [address] [place], [place] [address]  Protime-INR     Status: None   Collection Time:  06/14/22  8:29 PM  Result Value Ref Range   Prothrombin Time 12.9 11.4 - 15.2 seconds   INR 1.0 0.8 - 1.2    Comment: (NOTE) INR goal varies based on device and disease states. Performed at [facility], [address] [place], [place] [address]   Type and screen [place]     Status: None (Preliminary result)   Collection Time: 06/15/22  9:15 AM  Result Value Ref Range   ABO/RH(D) PENDING    Antibody Screen PENDING    Sample Expiration      06/18/2022,2359 Performed at [facility], [place] [address], [place],  [address]     Chest Portable 1 View  Result Date: 06/15/2022 CLINICAL DATA:  Fall EXAM: PORTABLE CHEST 1 VIEW COMPARISON:  09/18/2003 FINDINGS: Heart size within normal limits. Aortic atherosclerosis. Coarsened interstitial markings bilaterally, slightly more pronounced on the right. No airspace consolidation. No pleural effusion or pneumothorax. No acute bony findings. IMPRESSION: Coarsened interstitial markings bilaterally,  slightly more pronounced on the right. Findings may reflect chronic bronchitic lung changes versus mild edema or atypical/viral infection. Electronically Signed   By: Davina Poke D.O.   On: 06/15/2022 09:36   CT KNEE LEFT WO CONTRAST  Result Date: 06/15/2022 CLINICAL DATA:  Tibial plateau fracture. EXAM: CT OF THE LEFT KNEE WITHOUT CONTRAST TECHNIQUE: Multidetector CT imaging of the left knee was performed according to the standard protocol. Multiplanar CT image reconstructions were also generated. RADIATION DOSE REDUCTION: This exam was performed according to the departmental dose-optimization program which includes automated exposure control, adjustment of the mA and/or kV according to patient size and/or use of iterative reconstruction technique. COMPARISON:  Radiographs 06/14/2022 FINDINGS: Severely comminuted and depressed fracture of the tibial plateau this is mainly a die punch type fracture involving the central aspect of the tibia with associated splitting of the medial and lateral condyles. The medial portion of the lateral tibial plateau has a trap door appearance and is depressed approximately 14 mm. The lateral half of the lateral tibial plateau is displaced laterally and is not articulating with the lateral femoral condyle. Displacement estimated at 2 cm. The fracture extends out through the medial cortex of the tibia near the metadiaphyseal junction. There is a comminuted fracture of the proximal fibular shaft with 1/2 shaft width of ventral displacement. Moderate-sized butterfly fragment. The femur and patella are intact. Lipo hemarthrosis is noted. Ligaments are difficult to assess on CT. High suspicion for lateral collateral ligament disruption. IMPRESSION: 1. Severely comminuted and depressed fracture of the tibial plateau as described above. 2. Comminuted fracture of the proximal fibular shaft. 3. High suspicion for fibular collateral ligament disruption. Electronically Signed   By: Marijo Sanes M.D.   On: 06/15/2022 06:34   DG Tibia/Fibula Left  Result Date: 06/14/2022 CLINICAL DATA:  Pain and limited range of motion after a fall. EXAM: LEFT TIBIA AND FIBULA - 2 VIEW COMPARISON:  None Available. FINDINGS: Comminuted and displaced fractures are demonstrated involving the proximal tibial metaphysis with fracture lines involving the central metaphysis and extending to the central aspects of the medial and lateral tibial plateaus at the base of the tibial spines. There is complete lateral displacement of the lateral tibial plateau with respect to the distal femur. There is a comminuted mostly transverse fracture of the proximal fibular shaft with mild lateral displacement and anterior displacement of the distal fracture fragment. There is associated soft tissue swelling and a large left knee effusion. Old  internal fixation and degenerative changes are demonstrated in the ankle joint. IMPRESSION: 1. Comminuted and displaced fractures of the proximal tibial metaphysis with involvement of the medial and lateral tibial plateau and base of the tibial spines. Lateral dislocation of the lateral tibial plateau with respect to the femur. 2. Mostly transverse comminuted and mildly displaced fractures of the proximal fibular shaft. Electronically Signed   By: Lucienne Capers M.D.   On: 06/14/2022 20:20   DG FEMUR MIN 2 VIEWS LEFT  Result Date: 06/14/2022 CLINICAL DATA:  Pain and limited range of motion after a fall. EXAM: LEFT FEMUR 2 VIEWS COMPARISON:  None Available. FINDINGS: Multiple images of the left femur excluding the left knee. Visualized left hip and proximal femur appear intact. No evidence of dislocation at the hip joint. No acute fractures are demonstrated in the visualized portion. Soft tissues are unremarkable. IMPRESSION: No acute bony abnormalities demonstrated in the left hip and proximal femur. Electronically Signed   By: Lucienne Capers M.D.   On: 06/14/2022 20:15   DG Wrist  Complete Left  Result Date: 06/14/2022 CLINICAL DATA:  Pain and limited range of motion after a fall. EXAM: LEFT WRIST - COMPLETE 3+ VIEW COMPARISON:  None Available. FINDINGS: Comminuted and impacted fractures of the distal left radius with fracture lines extending to the radiocarpal and radioulnar joint. Nondisplaced fracture of the ulnar styloid process. Degenerative changes in the STT and first carpometacarpal joints as well as in the visualized interphalangeal joints. Severe dorsal soft tissue swelling over the wrist and distal forearm. IMPRESSION: Comminuted and impacted fractures of the distal left radius with nondisplaced fracture of the ulnar styloid process. Electronically Signed   By: Lucienne Capers M.D.   On: 06/14/2022 20:14    Review of Systems  HENT:  Negative for ear discharge, ear pain, hearing loss and tinnitus.   Eyes:  Negative for photophobia and pain.  Respiratory:  Negative for cough and shortness of breath.   Cardiovascular:  Negative for chest pain.  Gastrointestinal:  Negative for abdominal pain, nausea and vomiting.  Genitourinary:  Negative for dysuria, flank pain, frequency and urgency.  Musculoskeletal:  Positive for arthralgias (Left wrist, knee). Negative for back pain, myalgias and neck pain.  Neurological:  Negative for dizziness and headaches.  Hematological:  Does not bruise/bleed easily.  Psychiatric/Behavioral:  The patient is not nervous/anxious.    Blood pressure (!) 146/75, pulse 88, temperature 98.1 F (36.7 C), temperature source Oral, resp. rate 18, height '5\' 5"'$  (1.651 m), weight 122.5 kg, SpO2 94 %. Physical Exam Constitutional:      General: She is not in acute distress.    Appearance: She is well-developed. She is not diaphoretic.  HENT:     Head: Normocephalic and atraumatic.  Eyes:     General: No scleral icterus.       Right eye: No discharge.        Left eye: No discharge.     Conjunctiva/sclera: Conjunctivae normal.  Cardiovascular:      Rate and Rhythm: Normal rate and regular rhythm.  Pulmonary:     Effort: Pulmonary effort is normal. No respiratory distress.  Musculoskeletal:     Cervical back: Normal range of motion.     Comments: Left shoulder, elbow, wrist, digits- Sugar tong splint in place, nontender, no instability, no blocks to motion  Sens  Ax/R/M/U intact  Mot   Ax/ R/ PIN/ M/ AIN/ U intact  Cap refill <2s  LLE No traumatic wounds, ecchymosis, or rash  KI in place, compartments soft  No ankle effusion  Sens DPN, SPN, TN intact  Motor EHL, ext, flex, evers 5/5  DP 2+, PT 2+, No significant edema  Skin:    General: Skin is warm and dry.  Neurological:     Mental Status: She is alert.  Psychiatric:        Mood and Affect: Mood normal.        Behavior: Behavior normal.     Assessment/Plan: Left wrist fx -- Plan ORIF tomorrow with Dr. Doreatha Martin. Please keep NPO after MN. Left tibia plateau fx -- Plan ORIF tomorrow Multiple medical problems including osteoporosis and allergies -- per primary service    Lisette Abu, PA-C Orthopedic Surgery [phone] 06/15/2022, 9:40 AM

## 2022-06-15 NOTE — ED Notes (Signed)
Patient to CT.

## 2022-06-15 NOTE — Consult Note (Signed)
Reason for Consult:Left wrist/tibia plateau fxs Referring Physician: Fuller Plan Time called: [id number] Time at bedside: 0930   Megan Walter is an 77 y.o. female.  HPI: Megan Walter was going out her back door when she missed a step and fell. She had immediate left wrist and knee pain and could not get up. She was brought to the ED where x-rays showed a tibia plateau and wrist fx and orthopedic surgery was consulted. She lives at home with her husband and rarely uses any assistive devices.  Past Medical History:  Diagnosis Date   Tibial plateau fracture 2005   Right     Past Surgical History:  Procedure Laterality Date   ANKLE ARTHROCENTESIS     KNEE ARTHROCENTESIS      Family History  Problem Relation Age of Onset   Hypertension Mother    Heart attack Mother     Social History:  reports that she has never smoked. She does not have any smokeless tobacco history on file. She reports that she does not drink alcohol and does not use drugs.  Allergies: No Known Allergies  Medications: I have reviewed the patient's current medications.  Results for orders placed or performed during the hospital encounter of 06/14/22 (from the past 48 hour(s))  Basic metabolic panel     Status: Abnormal   Collection Time: 06/14/22  8:29 PM  Result Value Ref Range   Sodium 138 135 - 145 mmol/L   Potassium 5.2 (H) 3.5 - 5.1 mmol/L   Chloride 103 98 - 111 mmol/L   CO2 24 22 - 32 mmol/L   Glucose, Bld 116 (H) 70 - 99 mg/dL    Comment: Glucose reference range applies only to samples taken after fasting for at least 8 hours.   BUN 28 (H) 8 - 23 mg/dL   Creatinine, Ser 0.56 0.44 - 1.00 mg/dL   Calcium 9.3 8.9 - 10.3 mg/dL   GFR, Estimated >60 >60 mL/min    Comment: (NOTE) Calculated using the CKD-EPI Creatinine Equation (2021)    Anion gap 11 5 - 15    Comment: Performed at [facility], [address] [place], [place] [address]  CBC with Differential     Status: Abnormal    Collection Time: 06/14/22  8:29 PM  Result Value Ref Range   WBC 17.7 (H) 4.0 - 10.5 K/uL   RBC 4.75 3.87 - 5.11 MIL/uL   Hemoglobin 13.6 12.0 - 15.0 g/dL   HCT 40.5 36.0 - 46.0 %   MCV 85.3 80.0 - 100.0 fL   MCH 28.6 26.0 - 34.0 pg   MCHC 33.6 30.0 - 36.0 g/dL   RDW 13.5 11.5 - 15.5 %   Platelets 249 150 - 400 K/uL   nRBC 0.0 0.0 - 0.2 %   Neutrophils Relative % 88 %   Neutro Abs 15.7 (H) 1.7 - 7.7 K/uL   Lymphocytes Relative 5 %   Lymphs Abs 0.8 0.7 - 4.0 K/uL   Monocytes Relative 6 %   Monocytes Absolute 1.1 (H) 0.1 - 1.0 K/uL   Eosinophils Relative 0 %   Eosinophils Absolute 0.0 0.0 - 0.5 K/uL   Basophils Relative 0 %   Basophils Absolute 0.0 0.0 - 0.1 K/uL   Immature Granulocytes 1 %   Abs Immature Granulocytes 0.08 (H) 0.00 - 0.07 K/uL    Comment: Performed at [facility], [address] [place], [place] [address]  Protime-INR     Status: None   Collection Time:  06/14/22  8:29 PM  Result Value Ref Range   Prothrombin Time 12.9 11.4 - 15.2 seconds   INR 1.0 0.8 - 1.2    Comment: (NOTE) INR goal varies based on device and disease states. Performed at [facility], [address] [place], [place] [address]   Type and screen [place]     Status: None (Preliminary result)   Collection Time: 06/15/22  9:15 AM  Result Value Ref Range   ABO/RH(D) PENDING    Antibody Screen PENDING    Sample Expiration      06/18/2022,2359 Performed at [facility], [place] [address] [place], [place] [address]     Chest Portable 1 View  Result Date: 06/15/2022 CLINICAL DATA:  Fall EXAM: PORTABLE CHEST 1 VIEW COMPARISON:  09/18/2003 FINDINGS: Heart size within normal limits. Aortic atherosclerosis. Coarsened interstitial markings bilaterally, slightly more pronounced on the right. No airspace consolidation. No pleural effusion or pneumothorax. No acute bony findings. IMPRESSION: Coarsened interstitial markings bilaterally,  slightly more pronounced on the right. Findings may reflect chronic bronchitic lung changes versus mild edema or atypical/viral infection. Electronically Signed   By: Davina Poke D.O.   On: 06/15/2022 09:36   CT KNEE LEFT WO CONTRAST  Result Date: 06/15/2022 CLINICAL DATA:  Tibial plateau fracture. EXAM: CT OF THE LEFT KNEE WITHOUT CONTRAST TECHNIQUE: Multidetector CT imaging of the left knee was performed according to the standard protocol. Multiplanar CT image reconstructions were also generated. RADIATION DOSE REDUCTION: This exam was performed according to the departmental dose-optimization program which includes automated exposure control, adjustment of the mA and/or kV according to patient size and/or use of iterative reconstruction technique. COMPARISON:  Radiographs 06/14/2022 FINDINGS: Severely comminuted and depressed fracture of the tibial plateau this is mainly a die punch type fracture involving the central aspect of the tibia with associated splitting of the medial and lateral condyles. The medial portion of the lateral tibial plateau has a trap door appearance and is depressed approximately 14 mm. The lateral half of the lateral tibial plateau is displaced laterally and is not articulating with the lateral femoral condyle. Displacement estimated at 2 cm. The fracture extends out through the medial cortex of the tibia near the metadiaphyseal junction. There is a comminuted fracture of the proximal fibular shaft with 1/2 shaft width of ventral displacement. Moderate-sized butterfly fragment. The femur and patella are intact. Lipo hemarthrosis is noted. Ligaments are difficult to assess on CT. High suspicion for lateral collateral ligament disruption. IMPRESSION: 1. Severely comminuted and depressed fracture of the tibial plateau as described above. 2. Comminuted fracture of the proximal fibular shaft. 3. High suspicion for fibular collateral ligament disruption. Electronically Signed   By: Marijo Sanes M.D.   On: 06/15/2022 06:34   DG Tibia/Fibula Left  Result Date: 06/14/2022 CLINICAL DATA:  Pain and limited range of motion after a fall. EXAM: LEFT TIBIA AND FIBULA - 2 VIEW COMPARISON:  None Available. FINDINGS: Comminuted and displaced fractures are demonstrated involving the proximal tibial metaphysis with fracture lines involving the central metaphysis and extending to the central aspects of the medial and lateral tibial plateaus at the base of the tibial spines. There is complete lateral displacement of the lateral tibial plateau with respect to the distal femur. There is a comminuted mostly transverse fracture of the proximal fibular shaft with mild lateral displacement and anterior displacement of the distal fracture fragment. There is associated soft tissue swelling and a large left knee effusion. Old  internal fixation and degenerative changes are demonstrated in the ankle joint. IMPRESSION: 1. Comminuted and displaced fractures of the proximal tibial metaphysis with involvement of the medial and lateral tibial plateau and base of the tibial spines. Lateral dislocation of the lateral tibial plateau with respect to the femur. 2. Mostly transverse comminuted and mildly displaced fractures of the proximal fibular shaft. Electronically Signed   By: Lucienne Capers M.D.   On: 06/14/2022 20:20   DG FEMUR MIN 2 VIEWS LEFT  Result Date: 06/14/2022 CLINICAL DATA:  Pain and limited range of motion after a fall. EXAM: LEFT FEMUR 2 VIEWS COMPARISON:  None Available. FINDINGS: Multiple images of the left femur excluding the left knee. Visualized left hip and proximal femur appear intact. No evidence of dislocation at the hip joint. No acute fractures are demonstrated in the visualized portion. Soft tissues are unremarkable. IMPRESSION: No acute bony abnormalities demonstrated in the left hip and proximal femur. Electronically Signed   By: Lucienne Capers M.D.   On: 06/14/2022 20:15   DG Wrist  Complete Left  Result Date: 06/14/2022 CLINICAL DATA:  Pain and limited range of motion after a fall. EXAM: LEFT WRIST - COMPLETE 3+ VIEW COMPARISON:  None Available. FINDINGS: Comminuted and impacted fractures of the distal left radius with fracture lines extending to the radiocarpal and radioulnar joint. Nondisplaced fracture of the ulnar styloid process. Degenerative changes in the STT and first carpometacarpal joints as well as in the visualized interphalangeal joints. Severe dorsal soft tissue swelling over the wrist and distal forearm. IMPRESSION: Comminuted and impacted fractures of the distal left radius with nondisplaced fracture of the ulnar styloid process. Electronically Signed   By: Lucienne Capers M.D.   On: 06/14/2022 20:14    Review of Systems  HENT:  Negative for ear discharge, ear pain, hearing loss and tinnitus.   Eyes:  Negative for photophobia and pain.  Respiratory:  Negative for cough and shortness of breath.   Cardiovascular:  Negative for chest pain.  Gastrointestinal:  Negative for abdominal pain, nausea and vomiting.  Genitourinary:  Negative for dysuria, flank pain, frequency and urgency.  Musculoskeletal:  Positive for arthralgias (Left wrist, knee). Negative for back pain, myalgias and neck pain.  Neurological:  Negative for dizziness and headaches.  Hematological:  Does not bruise/bleed easily.  Psychiatric/Behavioral:  The patient is not nervous/anxious.    Blood pressure (!) 146/75, pulse 88, temperature 98.1 F (36.7 C), temperature source Oral, resp. rate 18, height '5\' 5"'$  (1.651 m), weight 122.5 kg, SpO2 94 %. Physical Exam Constitutional:      General: She is not in acute distress.    Appearance: She is well-developed. She is not diaphoretic.  HENT:     Head: Normocephalic and atraumatic.  Eyes:     General: No scleral icterus.       Right eye: No discharge.        Left eye: No discharge.     Conjunctiva/sclera: Conjunctivae normal.  Cardiovascular:      Rate and Rhythm: Normal rate and regular rhythm.  Pulmonary:     Effort: Pulmonary effort is normal. No respiratory distress.  Musculoskeletal:     Cervical back: Normal range of motion.     Comments: Left shoulder, elbow, wrist, digits- Sugar tong splint in place, nontender, no instability, no blocks to motion  Sens  Ax/R/M/U intact  Mot   Ax/ R/ PIN/ M/ AIN/ U intact  Cap refill <2s  LLE No traumatic wounds, ecchymosis, or rash  KI in place, compartments soft  No ankle effusion  Sens DPN, SPN, TN intact  Motor EHL, ext, flex, evers 5/5  DP 2+, PT 2+, No significant edema  Skin:    General: Skin is warm and dry.  Neurological:     Mental Status: She is alert.  Psychiatric:        Mood and Affect: Mood normal.        Behavior: Behavior normal.     Assessment/Plan: Left wrist fx -- Plan ORIF tomorrow with Dr. Doreatha Martin. Please keep NPO after MN. Left tibia plateau fx -- Plan ORIF tomorrow Multiple medical problems including osteoporosis and allergies -- per primary service    Lisette Abu, PA-C Orthopedic Surgery [phone] 06/15/2022, 9:40 AM

## 2022-06-15 NOTE — H&P (Signed)
History and Physical    Patient: Megan Walter [id number] DOB: [birth year] DOA: 06/14/2022 DOS: the patient was seen and examined on 06/15/2022 PCP: Owens Loffler, DO  Patient coming from: Home via EMS  Chief Complaint:  Chief Complaint  Patient presents with   Fall   HPI: Megan Walter is a 77 y.o. female with medical history significant of osteoporosis who presented after having a fall with complaints of left arm and left leg pain.  She had been coming out of her garage when her foot slipped on the step and she fell down landing on her left side.  Denies any loss of consciousness or trauma to her head.  She reported immediate severe pain in her left wrist and knee for which she was unable to get up.  Otherwise patient has been in her normal state of health and does not report being on any prescribed medications.  In the emergency department patient was noted to be afebrile with tachypnea and blood pressures 99/75-167/78, and all other vital signs maintained.  X-rays of the left leg revealed communicated and displaced fractures of the proximal tibial metaphysis  and transverse communicated and mildly displaced fractures of the proximal fibular shaft.  X-rays of the left wrist noted noted communicated impacted fractures of the distal left radius with nondisplaced fracture of the ulnar styloid process.  Labs from 2/21 noted WBC 17.7 and potassium 5.2.  The ED prior to place patient's left wrist in a sugar-tong splint.  Orthopedics has been consulted with plans for ORIF of the left wrist fracture and tibial plateau fracture.  CT scan of the left leg showed similar.  Patient has been given Dilaudid IV and started on lactated Ringer's at 75 mL/h.  Review of Systems: As mentioned in the history of present illness. All other systems reviewed and are negative. Past Medical History:  Diagnosis Date   Tibial plateau fracture 2005   Right    Past Surgical History:  Procedure Laterality Date    ANKLE ARTHROCENTESIS     KNEE ARTHROCENTESIS     Social History:  reports that she has never smoked. She does not have any smokeless tobacco history on file. She reports that she does not drink alcohol and does not use drugs.  No Known Allergies  Family History  Problem Relation Age of Onset   Hypertension Mother    Heart attack Mother     Prior to Admission medications   Medication Sig Start Date End Date Taking? Authorizing Provider  calcium citrate (CALCITRATE - DOSED IN MG ELEMENTAL CALCIUM) 950 MG tablet Take 1 tablet by mouth daily.    [provider]  cetirizine (ZYRTEC) 10 MG tablet Take 1 tablet (10 mg total) by mouth daily. 02/20/22   Owens Loffler, DO  cholecalciferol (VITAMIN D) 1000 UNITS tablet Take 1,000 Units by mouth daily.    [provider]  fluticasone (FLONASE) 50 MCG/ACT nasal spray Place 2 sprays into both nostrils daily. 02/20/22   Owens Loffler, DO  nitrofurantoin (MACRODANTIN) 100 MG capsule Take 100 mg by mouth 2 (two) times daily.    [provider]    Physical Exam: Vitals:   06/15/22 0400 06/15/22 0407 06/15/22 0500 06/15/22 0702  BP: 128/63  120/68 (!) 146/75  Pulse: 95  92 88  Resp: 18  16 18  $ Temp:  98 F (36.7 C)  98.1 F (36.7 C)  TempSrc:  Oral  Oral  SpO2: 91%  95% 94%  Weight:  Height:       Exam  Constitutional: Elderly female currently in no acute distress Eyes: PERRL, lids and conjunctivae normal ENMT: Mucous membranes are moist.   Neck: normal, supple Respiratory: clear to auscultation bilaterally, no wheezing, no crackles. Normal respiratory effort. No accessory muscle use.  Cardiovascular: Regular rate and rhythm.  No significant lower extremity edema appreciated. Abdomen: no tenderness, no masses palpated.  Bowel sounds positive.  Musculoskeletal: no clubbing / cyanosis.  Left leg in knee immobilizer and left wrist currently splinted. Skin: no rashes, lesions, ulcers. No induration Neurologic:  CN 2-12 grossly intact.    Strength 5/5 in all 4.  Psychiatric: Normal judgment and insight. Alert and oriented x 3. Normal mood.   Data Reviewed:  EKG reveals sinus tachycardia 105 bpm with QTc 499.  Reviewed labs and imaging and pertinent records as noted above in the HPI  Assessment and Plan:  Left wrist and left tibial plateau fracture secondary to fall Acute.  Patient presents after having a fall and was found to have closed left wrist, left tibia, and left fibular fractures.  Dr. Doreatha Martin of orthopedics consulted and plan on taking the patient to surgery tomorrow. -Admit to a telemetry bed -Hip/femur fracture order set utilized -N.p.o. after midnight -Hydrocodone/Dilaudid IV as needed for moderate to severe pain pain -Senokot nightly -Appreciate orthopedic consultative services we will follow-up for any further recommendations.  Hyperkalemia Resolved.  On admission potassium 5.2.  Question if this is possible lab error or related to the fall and fracture.  Patient had been started on lactated Ringer's at 75 mL/h. -Check BMP (potassium 3.7) and CK (1080) -Will continue IV fluids and check repeat CK in a.m.  Prolonged QT interval Resolved.  QTc 499 on admission. -Avoid QT prolonging medications. -Correct electrolyte abnormalities -Repeat EKG revealed QT interval within normal limits with QTc 442  Osteoporosis Chronic.  Patient has been noted to have a formal diagnosis of osteoporosis back in 2011.  No documentation for repeat bone density available at this time.  Morbid obesity BMI 44.93 kg/m  DVT prophylaxis: Lovenox Advance Care Planning:   Code Status: Full Code    Consults: Orthopedics  Family Communication: Daughter is updated at bedside.  Severity of Illness: The appropriate patient status for this patient is INPATIENT. Inpatient status is judged to be reasonable and necessary in order to provide the required intensity of service to ensure the patient's safety. The  patient's presenting symptoms, physical exam findings, and initial radiographic and laboratory data in the context of their chronic comorbidities is felt to place them at high risk for further clinical deterioration. Furthermore, it is not anticipated that the patient will be medically stable for discharge from the hospital within 2 midnights of admission.   * I certify that at the point of admission it is my clinical judgment that the patient will require inpatient hospital care spanning beyond 2 midnights from the point of admission due to high intensity of service, high risk for further deterioration and high frequency of surveillance required.*  Author: Norval Morton, MD 06/15/2022 8:27 AM  For on call review [email or web address].

## 2022-06-15 NOTE — Progress Notes (Signed)
Pt arrived in the unit 5N10. Alert and oriented x4. V/s taken and recorded. Ace splint in left arm and knee immobilizer to the right leg.

## 2022-06-15 NOTE — Progress Notes (Signed)
[facility] Admits and Consults paged via Amion for patient's admission.

## 2022-06-15 NOTE — Progress Notes (Signed)
Ortho Trauma Note  Reviewed chart and imaging. Formal consult to follow this AM. Surgery likely tomorrow but continue NPO for now until plan finalized.  Shona Needles, MD [facility] [phone] (office) [email or web address]

## 2022-06-16 ENCOUNTER — Encounter (HOSPITAL_COMMUNITY): Payer: Self-pay | Admitting: Internal Medicine

## 2022-06-16 ENCOUNTER — Inpatient Hospital Stay (HOSPITAL_COMMUNITY): Payer: Medicare Other

## 2022-06-16 ENCOUNTER — Inpatient Hospital Stay (HOSPITAL_COMMUNITY): Payer: Medicare Other | Admitting: Anesthesiology

## 2022-06-16 ENCOUNTER — Other Ambulatory Visit: Payer: Self-pay

## 2022-06-16 ENCOUNTER — Encounter (HOSPITAL_COMMUNITY)
Admission: EM | Disposition: A | Discharge status: Rehabilitation Facility | Payer: Self-pay | Source: Home / Self Care | Attending: Internal Medicine

## 2022-06-16 DIAGNOSIS — D649 Anemia, unspecified: Secondary | ICD-10-CM

## 2022-06-16 DIAGNOSIS — Z6841 Body Mass Index (BMI) 40.0 and over, adult: Secondary | ICD-10-CM

## 2022-06-16 DIAGNOSIS — S82142A Displaced bicondylar fracture of left tibia, initial encounter for closed fracture: Secondary | ICD-10-CM

## 2022-06-16 DIAGNOSIS — S52572A Other intraarticular fracture of lower end of left radius, initial encounter for closed fracture: Secondary | ICD-10-CM

## 2022-06-16 DIAGNOSIS — S82102A Unspecified fracture of upper end of left tibia, initial encounter for closed fracture: Secondary | ICD-10-CM | POA: Diagnosis not present

## 2022-06-16 HISTORY — PX: ORIF WRIST FRACTURE: SHX2133

## 2022-06-16 HISTORY — PX: ORIF TIBIA PLATEAU: SHX2132

## 2022-06-16 LAB — BASIC METABOLIC PANEL
Anion gap: 11 (ref 5–15)
BUN: 32 mg/dL — ABNORMAL HIGH (ref 8–23)
CO2: 26 mmol/L (ref 22–32)
Calcium: 8.8 mg/dL — ABNORMAL LOW (ref 8.9–10.3)
Chloride: 102 mmol/L (ref 98–111)
Creatinine, Ser: 0.79 mg/dL (ref 0.44–1.00)
GFR, Estimated: >60 mL/min (ref >60)
Glucose, Bld: 105 mg/dL — ABNORMAL HIGH (ref 70–99)
Potassium: 3.8 mmol/L (ref 3.5–5.1)
Sodium: 139 mmol/L (ref 135–145)

## 2022-06-16 LAB — CBC
HCT: 33.7 % — ABNORMAL LOW (ref 36.0–46.0)
Hemoglobin: 11.2 g/dL — ABNORMAL LOW (ref 12.0–15.0)
MCH: 28.6 pg (ref 26.0–34.0)
MCHC: 33.2 g/dL (ref 30.0–36.0)
MCV: 86 fL (ref 80.0–100.0)
Platelets: 200 10*3/uL (ref 150–400)
RBC: 3.92 MIL/uL (ref 3.87–5.11)
RDW: 13.6 % (ref 11.5–15.5)
WBC: 10 10*3/uL (ref 4.0–10.5)
nRBC: 0 % (ref 0.0–0.2)

## 2022-06-16 LAB — CK: Total CK: 1014 U/L — ABNORMAL HIGH (ref 38–234)

## 2022-06-16 SURGERY — OPEN REDUCTION INTERNAL FIXATION (ORIF) WRIST FRACTURE
Anesthesia: General | Site: Wrist | Laterality: Left

## 2022-06-16 MED ORDER — MIDAZOLAM HCL 2 MG/2ML IJ SOLN: 1.0000 mg | Freq: Once | INTRAMUSCULAR | Status: AC

## 2022-06-16 MED ORDER — ORAL CARE MOUTH RINSE: 15.0000 mL | Freq: Once | OROMUCOSAL | Status: AC

## 2022-06-16 MED ORDER — HYDROMORPHONE HCL 1 MG/ML IJ SOLN
INTRAMUSCULAR | Status: AC
  Filled 2022-06-16: qty 1

## 2022-06-16 MED ORDER — ONDANSETRON HCL 4 MG/2ML IJ SOLN
INTRAMUSCULAR | Status: AC
  Filled 2022-06-16: qty 2

## 2022-06-16 MED ORDER — FENTANYL CITRATE (PF) 100 MCG/2ML IJ SOLN
INTRAMUSCULAR | Status: AC
  Administered 2022-06-16: 100 ug via INTRAVENOUS
  Filled 2022-06-16: qty 2

## 2022-06-16 MED ORDER — DEXMEDETOMIDINE HCL IN NACL 80 MCG/20ML IV SOLN
INTRAVENOUS | Status: AC
  Filled 2022-06-16: qty 20

## 2022-06-16 MED ORDER — HYDROCODONE-ACETAMINOPHEN 5-325 MG PO TABS
1.0000 | ORAL_TABLET | ORAL | Status: DC | PRN
  Administered 2022-06-17: 2 via ORAL
  Filled 2022-06-16 (×2): qty 2

## 2022-06-16 MED ORDER — LIDOCAINE 2% (20 MG/ML) 5 ML SYRINGE
INTRAMUSCULAR | Status: AC
  Filled 2022-06-16: qty 5

## 2022-06-16 MED ORDER — DEXMEDETOMIDINE HCL IN NACL 80 MCG/20ML IV SOLN
INTRAVENOUS | Status: DC | PRN
  Administered 2022-06-16: 12 ug via BUCCAL
  Administered 2022-06-16 (×2): 8 ug via BUCCAL

## 2022-06-16 MED ORDER — CHLORHEXIDINE GLUCONATE 0.12 % MT SOLN: 15.0000 mL | Freq: Once | OROMUCOSAL | Status: AC

## 2022-06-16 MED ORDER — BUPIVACAINE LIPOSOME 1.3 % IJ SUSP
INTRAMUSCULAR | Status: DC | PRN
  Administered 2022-06-16: 10 mL via PERINEURAL

## 2022-06-16 MED ORDER — FENTANYL CITRATE (PF) 250 MCG/5ML IJ SOLN
INTRAMUSCULAR | Status: AC
  Filled 2022-06-16: qty 5

## 2022-06-16 MED ORDER — POLYETHYLENE GLYCOL 3350 17 G PO PACK: 17.0000 g | PACK | Freq: Every day | ORAL | Status: DC | PRN

## 2022-06-16 MED ORDER — METOCLOPRAMIDE HCL 5 MG/ML IJ SOLN: 5.0000 mg | Freq: Three times a day (TID) | INTRAMUSCULAR | Status: DC | PRN

## 2022-06-16 MED ORDER — ACETAMINOPHEN 500 MG PO TABS
1000.0000 mg | ORAL_TABLET | Freq: Once | ORAL | Status: AC
  Administered 2022-06-16: 1000 mg via ORAL
  Filled 2022-06-16: qty 2

## 2022-06-16 MED ORDER — DEXAMETHASONE SODIUM PHOSPHATE 10 MG/ML IJ SOLN
INTRAMUSCULAR | Status: AC
  Filled 2022-06-16: qty 1

## 2022-06-16 MED ORDER — ONDANSETRON HCL 4 MG/2ML IJ SOLN
INTRAMUSCULAR | Status: DC | PRN
  Administered 2022-06-16: 4 mg via INTRAVENOUS

## 2022-06-16 MED ORDER — CHLORHEXIDINE GLUCONATE 4 % EX LIQD
CUTANEOUS | Status: AC
  Filled 2022-06-16: qty 15

## 2022-06-16 MED ORDER — HYDROCODONE-ACETAMINOPHEN 7.5-325 MG PO TABS
1.0000 | ORAL_TABLET | ORAL | Status: DC | PRN
  Administered 2022-06-17 (×2): 2 via ORAL
  Administered 2022-06-25: 1 via ORAL
  Filled 2022-06-16: qty 1
  Filled 2022-06-16 (×2): qty 2

## 2022-06-16 MED ORDER — HYDROMORPHONE HCL 1 MG/ML IJ SOLN
INTRAMUSCULAR | Status: AC
  Filled 2022-06-16: qty 0.5

## 2022-06-16 MED ORDER — CHLORHEXIDINE GLUCONATE 0.12 % MT SOLN
OROMUCOSAL | Status: AC
  Administered 2022-06-16: 15 mL via OROMUCOSAL
  Filled 2022-06-16: qty 15

## 2022-06-16 MED ORDER — AMISULPRIDE (ANTIEMETIC) 5 MG/2ML IV SOLN: 10.0000 mg | Freq: Once | INTRAVENOUS | Status: DC | PRN

## 2022-06-16 MED ORDER — MORPHINE SULFATE (PF) 2 MG/ML IV SOLN
0.5000 mg | INTRAVENOUS | Status: DC | PRN
  Administered 2022-06-16 (×2): 1 mg via INTRAVENOUS
  Filled 2022-06-16 (×3): qty 1

## 2022-06-16 MED ORDER — VANCOMYCIN HCL 1000 MG IV SOLR
INTRAVENOUS | Status: AC
  Filled 2022-06-16: qty 40

## 2022-06-16 MED ORDER — LIDOCAINE 2% (20 MG/ML) 5 ML SYRINGE
INTRAMUSCULAR | Status: DC | PRN
  Administered 2022-06-16: 100 mg via INTRAVENOUS

## 2022-06-16 MED ORDER — FENTANYL CITRATE (PF) 100 MCG/2ML IJ SOLN: 100.0000 ug | Freq: Once | INTRAMUSCULAR | Status: AC

## 2022-06-16 MED ORDER — BUPIVACAINE LIPOSOME 1.3 % IJ SUSP
INTRAMUSCULAR | Status: AC
  Filled 2022-06-16: qty 10

## 2022-06-16 MED ORDER — VANCOMYCIN HCL 1000 MG IV SOLR
INTRAVENOUS | Status: DC | PRN
  Administered 2022-06-16: 2 g via TOPICAL

## 2022-06-16 MED ORDER — MIDAZOLAM HCL 2 MG/2ML IJ SOLN
INTRAMUSCULAR | Status: AC
  Administered 2022-06-16: 1 mg via INTRAVENOUS
  Filled 2022-06-16: qty 2

## 2022-06-16 MED ORDER — FENTANYL CITRATE (PF) 250 MCG/5ML IJ SOLN
INTRAMUSCULAR | Status: DC | PRN
  Administered 2022-06-16 (×6): 25 ug via INTRAVENOUS
  Administered 2022-06-16: 50 ug via INTRAVENOUS
  Administered 2022-06-16 (×2): 25 ug via INTRAVENOUS

## 2022-06-16 MED ORDER — HYDROMORPHONE HCL 1 MG/ML IJ SOLN
0.2500 mg | INTRAMUSCULAR | Status: DC | PRN
  Administered 2022-06-16 (×2): 0.5 mg via INTRAVENOUS

## 2022-06-16 MED ORDER — ACETAMINOPHEN 325 MG PO TABS
325.0000 mg | ORAL_TABLET | Freq: Four times a day (QID) | ORAL | Status: DC | PRN
  Administered 2022-06-22 – 2022-06-26 (×5): 650 mg via ORAL
  Filled 2022-06-16 (×5): qty 2

## 2022-06-16 MED ORDER — METOCLOPRAMIDE HCL 5 MG PO TABS: 5.0000 mg | ORAL_TABLET | Freq: Three times a day (TID) | ORAL | Status: DC | PRN

## 2022-06-16 MED ORDER — DOCUSATE SODIUM 100 MG PO CAPS
100.0000 mg | ORAL_CAPSULE | Freq: Two times a day (BID) | ORAL | Status: DC
  Administered 2022-06-16 – 2022-06-25 (×4): 100 mg via ORAL
  Filled 2022-06-16 (×11): qty 1

## 2022-06-16 MED ORDER — CEFAZOLIN SODIUM-DEXTROSE 2-4 GM/100ML-% IV SOLN
2.0000 g | Freq: Three times a day (TID) | INTRAVENOUS | Status: AC
  Administered 2022-06-16 – 2022-06-17 (×3): 2 g via INTRAVENOUS
  Filled 2022-06-16 (×3): qty 100

## 2022-06-16 MED ORDER — DEXAMETHASONE SODIUM PHOSPHATE 10 MG/ML IJ SOLN
INTRAMUSCULAR | Status: DC | PRN
  Administered 2022-06-16: 4 mg via INTRAVENOUS

## 2022-06-16 MED ORDER — SODIUM CHLORIDE 0.9 % IV SOLN: INTRAVENOUS | Status: DC

## 2022-06-16 MED ORDER — PROPOFOL 10 MG/ML IV BOLUS
INTRAVENOUS | Status: DC | PRN
  Administered 2022-06-16: 170 mg via INTRAVENOUS
  Administered 2022-06-16: 30 mg via INTRAVENOUS

## 2022-06-16 MED ORDER — LACTATED RINGERS IV SOLN: INTRAVENOUS | Status: DC

## 2022-06-16 MED ORDER — 0.9 % SODIUM CHLORIDE (POUR BTL) OPTIME
TOPICAL | Status: DC | PRN
  Administered 2022-06-16: 1000 mL

## 2022-06-16 MED ORDER — BUPIVACAINE-EPINEPHRINE (PF) 0.5% -1:200000 IJ SOLN
INTRAMUSCULAR | Status: DC | PRN
  Administered 2022-06-16: 20 mL via PERINEURAL

## 2022-06-16 MED ORDER — HYDROMORPHONE HCL 1 MG/ML IJ SOLN
INTRAMUSCULAR | Status: DC | PRN
  Administered 2022-06-16: .5 mg via INTRAVENOUS

## 2022-06-16 SURGICAL SUPPLY — 120 items
ADH SKN CLS APL DERMABOND .7 (GAUZE/BANDAGES/DRESSINGS) ×2
APL PRP STRL LF DISP 70% ISPRP (MISCELLANEOUS) ×6
APL SKNCLS STERI-STRIP NONHPOA (GAUZE/BANDAGES/DRESSINGS) ×2
BAG COUNTER SPONGE SURGICOUNT (BAG) ×3 IMPLANT
BAG SPNG CNTER NS LX DISP (BAG) ×2
BANDAGE ESMARK 6X9 LF (GAUZE/BANDAGES/DRESSINGS) ×3 IMPLANT
BENZOIN TINCTURE PRP APPL 2/3 (GAUZE/BANDAGES/DRESSINGS) IMPLANT
BIT DRILL 100X2XQC STRL (BIT) IMPLANT
BIT DRILL CALIBRATED 1.8MM (BIT) IMPLANT
BIT DRILL QC 2.0X100 (BIT) ×2
BIT DRILL QC SFS 2.5X170 (BIT) IMPLANT
BIT DRILL QC SFS 2.8X200 (BIT) IMPLANT
BIT DRL 100X2XQC STRL (BIT) ×2
BLADE CLIPPER SURG (BLADE) IMPLANT
BLADE SURG 15 STRL LF DISP TIS (BLADE) ×3 IMPLANT
BLADE SURG 15 STRL SS (BLADE) ×2
BNDG CMPR 9X4 STRL LF SNTH (GAUZE/BANDAGES/DRESSINGS)
BNDG CMPR 9X6 STRL LF SNTH (GAUZE/BANDAGES/DRESSINGS) ×2
BNDG ELASTIC 3X5.8 VLCR STR LF (GAUZE/BANDAGES/DRESSINGS) ×3 IMPLANT
BNDG ELASTIC 4X5.8 VLCR STR LF (GAUZE/BANDAGES/DRESSINGS) ×3 IMPLANT
BNDG ELASTIC 6X5.8 VLCR STR LF (GAUZE/BANDAGES/DRESSINGS) ×3 IMPLANT
BNDG ESMARK 4X9 LF (GAUZE/BANDAGES/DRESSINGS) ×3 IMPLANT
BNDG ESMARK 6X9 LF (GAUZE/BANDAGES/DRESSINGS) ×2
BNDG GAUZE DERMACEA FLUFF 4 (GAUZE/BANDAGES/DRESSINGS) ×3 IMPLANT
BNDG GZE DERMACEA 4 6PLY (GAUZE/BANDAGES/DRESSINGS)
BRUSH SCRUB EZ PLAIN DRY (MISCELLANEOUS) ×6 IMPLANT
CANISTER SUCT 3000ML PPV (MISCELLANEOUS) ×3 IMPLANT
CHLORAPREP W/TINT 26 (MISCELLANEOUS) ×6 IMPLANT
COVER SURGICAL LIGHT HANDLE (MISCELLANEOUS) ×3 IMPLANT
CUFF TOURN SGL QUICK 18X4 (TOURNIQUET CUFF) IMPLANT
CUFF TOURN SGL QUICK 24 (TOURNIQUET CUFF)
CUFF TOURN SGL QUICK 34 (TOURNIQUET CUFF) ×2
CUFF TRNQT CYL 24X4X16.5-23 (TOURNIQUET CUFF) IMPLANT
CUFF TRNQT CYL 34X4.125X (TOURNIQUET CUFF) ×3 IMPLANT
DERMABOND ADVANCED .7 DNX12 (GAUZE/BANDAGES/DRESSINGS) IMPLANT
DISTAL RADIUS HEAD 6H 3HOLE SH (Orthopedic Implant) ×2 IMPLANT
DRAPE C-ARM 35X43 STRL (DRAPES) IMPLANT
DRAPE C-ARM 42X72 X-RAY (DRAPES) ×3 IMPLANT
DRAPE C-ARMOR (DRAPES) ×3 IMPLANT
DRAPE ORTHO SPLIT 77X108 STRL (DRAPES) ×4
DRAPE SURG ORHT 6 SPLT 77X108 (DRAPES) ×6 IMPLANT
DRAPE U-SHAPE 47X51 STRL (DRAPES) ×3 IMPLANT
DRILL CALIBRATED 1.8MM (BIT) ×2
DRSG MEPILEX POST OP 4X8 (GAUZE/BANDAGES/DRESSINGS) IMPLANT
ELECT REM PT RETURN 9FT ADLT (ELECTROSURGICAL) ×2
ELECTRODE REM PT RTRN 9FT ADLT (ELECTROSURGICAL) ×3 IMPLANT
GAUZE PAD ABD 8X10 STRL (GAUZE/BANDAGES/DRESSINGS) ×6 IMPLANT
GAUZE SPONGE 4X4 12PLY STRL (GAUZE/BANDAGES/DRESSINGS) ×3 IMPLANT
GAUZE XEROFORM 1X8 LF (GAUZE/BANDAGES/DRESSINGS) ×3 IMPLANT
GLOVE BIO SURGEON STRL SZ 6.5 (GLOVE) ×9 IMPLANT
GLOVE BIO SURGEON STRL SZ7.5 (GLOVE) ×12 IMPLANT
GLOVE BIOGEL PI IND STRL 6.5 (GLOVE) ×3 IMPLANT
GLOVE BIOGEL PI IND STRL 7.0 (GLOVE) ×3 IMPLANT
GLOVE BIOGEL PI IND STRL 7.5 (GLOVE) ×3 IMPLANT
GOWN STRL REUS W/ TWL LRG LVL3 (GOWN DISPOSABLE) ×6 IMPLANT
GOWN STRL REUS W/TWL LRG LVL3 (GOWN DISPOSABLE) ×4
HEAD RADIUS DISTAL 3HOLE SH 6H (Orthopedic Implant) IMPLANT
IMMOBILIZER KNEE 22 UNIV (SOFTGOODS) ×3 IMPLANT
K-WIRE 1.6X150 (WIRE) ×2
KIT BASIN OR (CUSTOM PROCEDURE TRAY) ×3 IMPLANT
KIT TURNOVER KIT B (KITS) ×3 IMPLANT
KWIRE 1.6X150 (WIRE) IMPLANT
MANIFOLD NEPTUNE II (INSTRUMENTS) ×3 IMPLANT
NDL 1/2 CIR CATGUT .05X1.09 (NEEDLE) IMPLANT
NDL 22X1.5 STRL (OR ONLY) (MISCELLANEOUS) IMPLANT
NDL SUT 6 .5 CRC .975X.05 MAYO (NEEDLE) ×3 IMPLANT
NEEDLE 1/2 CIR CATGUT .05X1.09 (NEEDLE) ×2 IMPLANT
NEEDLE 22X1.5 STRL (OR ONLY) (MISCELLANEOUS) IMPLANT
NEEDLE MAYO TAPER (NEEDLE)
NS IRRIG 1000ML POUR BTL (IV SOLUTION) ×3 IMPLANT
PACK ORTHO EXTREMITY (CUSTOM PROCEDURE TRAY) ×3 IMPLANT
PACK TOTAL JOINT (CUSTOM PROCEDURE TRAY) ×3 IMPLANT
PAD ARMBOARD 7.5X6 YLW CONV (MISCELLANEOUS) ×6 IMPLANT
PAD CAST 3X4 CTTN HI CHSV (CAST SUPPLIES) IMPLANT
PAD CAST 4YDX4 CTTN HI CHSV (CAST SUPPLIES) ×3 IMPLANT
PADDING CAST COTTON 3X4 STRL (CAST SUPPLIES) ×2
PADDING CAST COTTON 4X4 STRL (CAST SUPPLIES) ×2
PADDING CAST COTTON 6X4 STRL (CAST SUPPLIES) ×3 IMPLANT
PLATE TIBIA VA LCP 3.5 10H (Plate) IMPLANT
SCREW CORTEX 3.5X80MM (Screw) ×2 IMPLANT
SCREW LOCK CORT ST 3.5X28 (Screw) IMPLANT
SCREW LOCK CORT ST 3.5X30 (Screw) IMPLANT
SCREW LOCK CORT ST 3.5X32 (Screw) IMPLANT
SCREW LOCK T8 20X2.4XSTVA (Screw) IMPLANT
SCREW LOCKING 2.4X20 (Screw) ×4 IMPLANT
SCREW LOCKING 2.4X22 (Screw) IMPLANT
SCREW LOCKING 3.5X70MM VA (Screw) IMPLANT
SCREW LOCKING 3.5X80MM VA (Screw) IMPLANT
SCREW LOCKING VA 3.5X75MM (Screw) IMPLANT
SCREW SELF TAP 12M (Screw) IMPLANT
SCREW VA LOCKING 2.4X18 (Screw) IMPLANT
SLING ARM FOAM STRAP XLG (SOFTGOODS) IMPLANT
SPIKE FLUID TRANSFER (MISCELLANEOUS) ×3 IMPLANT
SPLINT PLASTER CAST XFAST 4X15 (CAST SUPPLIES) IMPLANT
STAPLER VISISTAT 35W (STAPLE) ×3 IMPLANT
STRIP CLOSURE SKIN 1/2X4 (GAUZE/BANDAGES/DRESSINGS) IMPLANT
SUCTION FRAZIER HANDLE 10FR (MISCELLANEOUS) ×2
SUCTION TUBE FRAZIER 10FR DISP (MISCELLANEOUS) ×3 IMPLANT
SUT ETHILON 2 0 FS 18 (SUTURE) ×3 IMPLANT
SUT ETHILON 3 0 PS 1 (SUTURE) IMPLANT
SUT FIBERWIRE #2 38 T-5 BLUE (SUTURE)
SUT MNCRL AB 3-0 PS2 27 (SUTURE) IMPLANT
SUT VIC AB 0 CT1 18XCR BRD 8 (SUTURE) IMPLANT
SUT VIC AB 0 CT1 27 (SUTURE)
SUT VIC AB 0 CT1 27XBRD ANBCTR (SUTURE) IMPLANT
SUT VIC AB 0 CT1 8-18 (SUTURE) ×2
SUT VIC AB 1 CT1 18XCR BRD 8 (SUTURE) IMPLANT
SUT VIC AB 1 CT1 27 (SUTURE) ×2
SUT VIC AB 1 CT1 27XBRD ANBCTR (SUTURE) ×3 IMPLANT
SUT VIC AB 1 CT1 8-18 (SUTURE)
SUT VIC AB 2-0 CT1 27 (SUTURE) ×2
SUT VIC AB 2-0 CT1 TAPERPNT 27 (SUTURE) ×6 IMPLANT
SUTURE FIBERWR #2 38 T-5 BLUE (SUTURE) IMPLANT
SYR CONTROL 10ML LL (SYRINGE) ×3 IMPLANT
TOWEL GREEN STERILE (TOWEL DISPOSABLE) ×6 IMPLANT
TOWEL GREEN STERILE FF (TOWEL DISPOSABLE) ×3 IMPLANT
TRAY FOLEY MTR SLVR 16FR STAT (SET/KITS/TRAYS/PACK) IMPLANT
TUBE CONNECTING 12X1/4 (SUCTIONS) ×3 IMPLANT
WATER STERILE IRR 1000ML POUR (IV SOLUTION) ×6 IMPLANT
YANKAUER SUCT BULB TIP NO VENT (SUCTIONS) IMPLANT

## 2022-06-16 NOTE — Interval H&P Note (Signed)
History and Physical Interval Note:  06/16/2022 2:31 PM  Megan Walter  has presented today for surgery, with the diagnosis of Left Wrist and Left Tibia Platuea Fracture.  The various methods of treatment have been discussed with the patient and family. After consideration of risks, benefits and other options for treatment, the patient has consented to  Procedure(s): OPEN REDUCTION INTERNAL FIXATION (ORIF) WRIST FRACTURE (Left) OPEN REDUCTION INTERNAL FIXATION (ORIF) TIBIAL PLATEAU (Left) as a surgical intervention.  The patient's history has been reviewed, patient examined, no change in status, stable for surgery.  I have reviewed the patient's chart and labs.  Questions were answered to the patient's satisfaction.     Lennette Bihari P Stacey Sago

## 2022-06-16 NOTE — Anesthesia Preprocedure Evaluation (Addendum)
Anesthesia Evaluation  Patient identified by MRN, date of birth, ID band Patient awake    Reviewed: Allergy & Precautions, NPO status , Patient's Chart, lab work & pertinent test results  History of Anesthesia Complications Negative for: history of anesthetic complications  Airway Mallampati: II  TM Distance: >3 FB Neck ROM: Full    Dental  (+) Chipped,    Pulmonary neg pulmonary ROS   Pulmonary exam normal        Cardiovascular negative cardio ROS Normal cardiovascular exam     Neuro/Psych negative neurological ROS     GI/Hepatic negative GI ROS, Neg liver ROS,,,  Endo/Other    Morbid obesity  Renal/GU negative Renal ROS  negative genitourinary   Musculoskeletal Left Wrist and Left Tibia Platuea Fracture   Abdominal   Peds  Hematology  (+) Blood dyscrasia (Hgb 11.2), anemia   Anesthesia Other Findings Day of surgery medications reviewed with patient.  Reproductive/Obstetrics negative OB ROS                             Anesthesia Physical Anesthesia Plan  ASA: 3  Anesthesia Plan: General   Post-op Pain Management: Regional block* and Tylenol PO (pre-op)*   Induction: Intravenous  PONV Risk Score and Plan: 3 and Treatment may vary due to age or medical condition, Dexamethasone and Ondansetron  Airway Management Planned: LMA  Additional Equipment: None  Intra-op Plan:   Post-operative Plan: Extubation in OR  Informed Consent: I have reviewed the patients History and Physical, chart, labs and discussed the procedure including the risks, benefits and alternatives for the proposed anesthesia with the patient or authorized representative who has indicated his/her understanding and acceptance.     Dental advisory given  Plan Discussed with: CRNA  Anesthesia Plan Comments:        Anesthesia Quick Evaluation

## 2022-06-16 NOTE — Transfer of Care (Signed)
Immediate Anesthesia Transfer of Care Note  Patient: ELISIANA PORTA  Procedure(s) Performed: OPEN REDUCTION INTERNAL FIXATION (ORIF) WRIST FRACTURE (Left: Wrist) OPEN REDUCTION INTERNAL FIXATION (ORIF) TIBIAL PLATEAU (Left: Leg Lower)  Patient Location: PACU  Anesthesia Type:General and Regional  Level of Consciousness: awake and alert   Airway & Oxygen Therapy: Patient Spontanous Breathing and Patient connected to face mask oxygen  Post-op Assessment: Report given to RN and Post -op Vital signs reviewed and stable  Post vital signs: Reviewed and stable  Last Vitals:  Vitals Value Taken Time  BP 150/110 06/16/22 1733  Temp    Pulse 93 06/16/22 1735  Resp 20 06/16/22 1735  SpO2 94 % 06/16/22 1735  Vitals shown include unvalidated device data.  Last Pain:  Vitals:   06/16/22 0846  TempSrc:   PainSc: 4       Patients Stated Pain Goal: 0 ([id number])  Complications: No notable events documented.

## 2022-06-16 NOTE — Progress Notes (Signed)
Patient's daughter is asking staff to please give her mother some time to rest without any disturbances.  We agreed not to disturb patient for the next one hour since patient has been reported confused after surgery and she is currently asleep.

## 2022-06-16 NOTE — Anesthesia Procedure Notes (Signed)
Anesthesia Regional Block: Supraclavicular block   Pre-Anesthetic Checklist: , timeout performed,  Correct Patient, Correct Site, Correct Laterality,  Correct Procedure, Correct Position, site marked,  Risks and benefits discussed,  Pre-op evaluation,  At surgeon's request and post-op pain management  Laterality: Left  Prep: Maximum Sterile Barrier Precautions used, chloraprep       Needles:  Injection technique: Single-shot  Needle Type: Echogenic Stimulator Needle     Needle Length: 9cm  Needle Gauge: 22     Additional Needles:   Procedures:,,,, ultrasound used (permanent image in chart),,    Narrative:  Start time: 06/16/2022 2:47 PM End time: 06/16/2022 2:50 PM Injection made incrementally with aspirations every 5 mL.  Performed by: Personally  Anesthesiologist: Brennan Bailey, MD  Additional Notes: Risks, benefits, and alternative discussed. Patient gave consent for procedure. Patient prepped and draped in sterile fashion. Sedation administered, patient remains easily responsive to voice. Relevant anatomy identified with ultrasound guidance. Local anesthetic given in 5cc increments with no signs or symptoms of intravascular injection. No pain or paraesthesias with injection. Patient monitored throughout procedure with signs of LAST or immediate complications. Tolerated well. Ultrasound image placed in chart.  Tawny Asal, MD

## 2022-06-16 NOTE — Progress Notes (Signed)
PROGRESS NOTE  Megan Walter  DOB: [birth year]  PCP: Owens Loffler, DO [id number]  DOA: 06/14/2022  LOS: 1 day  Hospital Day: 3  Brief narrative: Megan Walter is a 77 y.o. female with PMH significant for osteoporosis 2/21, patient presented to the ED after a fall leading to immediate left arm and left leg pain.  Patient reportedly was coming out of her garage when her foot slipped on the step and she fell down landing on her left side.  Denies any loss of consciousness or trauma to her head.   In the ED, afebrile, hemodynamically stable X-rays of the left leg revealed acute communicated and displaced fractures of the proximal tibial metaphysis and transverse communicated and mildly displaced fractures of the proximal fibular shaft.  X-rays of the left wrist noted noted communicated impacted fractures of the distal left radius with nondisplaced fracture of the ulnar styloid process.   Labs 2/21 showed WBC 17.7 and potassium 5.2.   EDP applied patient's left wrist in a sugar-tong splint.   Orthopedics was consulted with plans for ORIF of the left wrist fracture and tibial plateau fracture.  Started on pain meds. Admitted to [facility]  Subjective: Patient was seen and examined this morning.  Pleasant elderly Caucasian female.  Not in distress pain controlled.  Pending surgery today.  Daughter at bedside. Chart reviewed Afebrile, blood pressure in 140s and 150s overnight Labs from this morning with normal WBC count, CK over thousand, creatinine normal,  Assessment and plan: Left wrist and left tibial plateau fracture  secondary to fall X-rays as above. Orthopedics involved.  Noted a plan for ORIF today. Continue pain management with IV and oral opiates as needed Bowel regimen scheduled and as needed  Rhabdomyolysis CK level elevated over thousand.  Continue monitor on IV fluid. Recent Labs  Lab 06/15/22 0917 06/16/22 0425  CKTOTAL 1,080* 1,014*   Osteoporosis Chronic.   Obtain vitamin D level.    Morbid Obesity  Body mass index is 44.93 kg/m. Patient has been advised to make an attempt to improve diet and exercise patterns to aid in weight loss.      Mobility: Encourage ambulation.  Needs PT eval postprocedure  Goals of care   Code Status: Full Code     DVT prophylaxis:  enoxaparin (LOVENOX) injection 40 mg Start: 06/15/22 0930   Antimicrobials: None Fluid: NS at 125 mill per hour Consultants: Orthopedics Family Communication: Daughter Amy at bedside  Status is: Inpatient Level of care: Telemetry Surgical   Dispo: Patient is from: Home              Anticipated d/c is to: Pending surgery, pending clinical course Continue in-hospital care because: Pending ORIF today   Scheduled Meds:  chlorhexidine       enoxaparin (LOVENOX) injection  40 mg Subcutaneous Q24H   melatonin  3 mg Oral QHS   povidone-iodine  2 Application Topical Once   senna-docusate  1 tablet Oral QHS    PRN meds: chlorhexidine, fluticasone, HYDROcodone-acetaminophen, HYDROmorphone (DILAUDID) injection, loratadine, methocarbamol **OR** methocarbamol (ROBAXIN) IV, ondansetron (ZOFRAN) IV   Infusions:   sodium chloride 125 mL/hr at 06/16/22 0854    ceFAZolin (ANCEF) IV     methocarbamol (ROBAXIN) IV     tranexamic acid      Diet:  Diet Order             Diet NPO time specified Except for: Sips with Meds  Diet effective midnight  Antimicrobials: Anti-infectives (From admission, onward)    Start     Dose/Rate Route Frequency Ordered Stop   06/16/22 0600  ceFAZolin (ANCEF) IVPB 3g/100 mL premix        3 g 200 mL/hr over 30 Minutes Intravenous On call to O.R. 06/15/22 2102 06/17/22 0559       Skin assessment:       Nutritional status:  Body mass index is 44.93 kg/m.          Objective: Vitals:   06/16/22 0458 06/16/22 0802  BP: (!) 154/73 (!) 145/76  Pulse: 90 97  Resp: 18 16  Temp: 98.4 F (36.9 C) 98.6 F (37 C)   SpO2: 94% 98%    Intake/Output Summary (Last 24 hours) at 06/16/2022 1250 Last data filed at 06/16/2022 0900 Gross per 24 hour  Intake 1791.07 ml  Output --  Net 1791.07 ml   Filed Weights   06/14/22 1736  Weight: 122.5 kg   Weight change:  Body mass index is 44.93 kg/m.   Physical Exam: General exam: Pleasant, elderly.  Not in pain Skin: No rashes, lesions or ulcers. HEENT: Atraumatic, normocephalic, no obvious bleeding Lungs: Clear to auscultation bilaterally CVS: Regular rate and rhythm, no murmur GI/Abd soft, nontender, nondistended, bowel sound present CNS: Alert, awake, oriented x 3 Psychiatry: Mood appropriate Extremities: Left upper and lower extremity braced  Data Review: I have personally reviewed the laboratory data and studies available.  F/u labs ordered Unresulted Labs (From admission, onward)     Start     Ordered   06/17/22 0500  CK  Tomorrow morning,   R       Question:  Specimen collection method  Answer:  Lab=Lab collect   06/16/22 0838   06/17/22 0500  CBC with Differential/Platelet  Tomorrow morning,   R       Question:  Specimen collection method  Answer:  Lab=Lab collect   06/16/22 1250   06/17/22 [id number]  Basic metabolic panel  Tomorrow morning,   R       Question:  Specimen collection method  Answer:  Lab=Lab collect   06/16/22 1250            Total time spent in review of labs and imaging, patient evaluation, formulation of plan, documentation and communication with family: 47 minutes  Signed, Terrilee Croak, MD [facility] 06/16/2022

## 2022-06-16 NOTE — Op Note (Signed)
Orthopaedic Surgery Operative Note (CSN: [id number] ) Date of Surgery: 06/16/2022  Admit Date: 06/14/2022   Diagnoses: Pre-Op Diagnoses: Left bicondylar tibial plateau fracture Left intra-articular distal radius fracture  Post-Op Diagnosis: Same  Procedures: CPT 27536-Open reduction internal fixation of left tibial plateau fracture CPT 25609-Open reduction internal fixation of left distal radius fracture  Surgeons : Primary: Shona Needles, MD  Assistant: Patrecia Pace, PA-C  Location: OR 3   Anesthesia: General with regional block for left upper extremity   Antibiotics: Ancef 2g preop with 1 gm for vancomycin powder placed topically in both incisions   Tourniquet time: * Missing tourniquet times found for documented tourniquets in log: [id number] * Total Tourniquet Time Documented: Thigh (Left) - 60 minutes Total: Thigh (Left) - 60 minutes  Estimated Blood 123456 mL  Complications:None   Specimens:None    Implants: Implant Name Type Inv. Item Serial No. Manufacturer Lot No. LRB No. Used Action  PLATE TIBIA VA LCP 3.5 10H - [id number] Plate PLATE TIBIA VA LCP 3.5 10H  [employer]  Left 1 Implanted  SCREW LOCKING 3.5X70MM VA - [id number] Screw SCREW LOCKING 3.5X70MM VA  [employer]  Left 2 Implanted  SCREW LOCKING VA 3.5X75MM - [id number] Screw SCREW LOCKING VA 3.5X75MM  [employer]  Left 1 Implanted  SCREW LOCKING 3.5X80MM VA - [id number] Screw SCREW LOCKING 3.5X80MM VA  [employer]  Left 1 Implanted  SCREW LOCK CORT ST 3.5X28 - [id number] Screw SCREW LOCK CORT ST 3.5X28  [employer]  Left 1 Implanted  SCREW LOCK CORT ST 3.5X30 - [id number] Screw SCREW LOCK CORT ST 3.5X30  [employer]  Left 2 Implanted  SCREW CORTEX 3.5X80MM - [id number] Screw SCREW CORTEX 3.5X80MM  [employer]  Left 1 Implanted  DISTAL RADIUS HEAD 6H 3HOLE SH - [id number] Orthopedic Implant DISTAL RADIUS HEAD 6H 3HOLE SH  [employer]  Left 1 Implanted   SCREW VA LOCKING 2.4X18 - [id number] Screw SCREW VA LOCKING 2.4X18  [employer]  Left 1 Implanted  SCREW LOCKING 2.4X20 - [id number] Screw SCREW LOCKING 2.4X20  [employer] ORTH[employer]  Left 2 Implanted  SCREW LOCKING 2.4X22 - [id number] Screw SCREW LOCKING 2.4X22  [employer]TH[employer]  Left 1 Implanted  SCREW SELF TAP 78M [id number] Screw SCREW SELF TAP 78M  [employer]  Left 3 Implanted     Indications for Surgery: 77 year old female who sustained a fall and a left bicondylar tibial plateau fracture and a left intra-articular distal radius fracture.  Due to the unstable nature of her injuries I recommend proceeding with open reduction internal fixation.  Risk and benefits were discussed with the patient.  Risks include but not limited to bleeding, infection, malunion, nonunion, hardware failure, hardware irritation, nerve or blood vessel injury, DVT, knee stiffness, wrist stiffness, posttraumatic arthritis, and the possibility anesthetic complications.  She agreed to proceed with surgery and consent was obtained.  Operative Findings: 1.  Open reduction internal fixation of left bicondylar tibial plateau fracture using Synthes VA 3.5 mm proximal tibial locking plate. 2.  Open reduction internal fixation of left intra-articular distal radius fracture using [employer]ynthe[employer] VA 2.7/2.4 mm volar distal radius plate  Procedure: The patient was identified in the preoperative holding area. Consent was confirmed with the patient and their family and all questions were answered. The operative extremity was marked after confirmation with the patient. she was then brought back to the operating room by our anesthesia colleagues.  She was placed under general anesthetic and carefully transferred over to radiolucent flattop table.  A bump was placed under her operative hip.  Nonsterile tourniquet was placed to the upper thigh and left upper extremity.  The left upper extremity and left lower extremity were then  prepped and draped in usual sterile fashion.  A timeout was performed to verify the patient, the procedure, and the extremity.  Preoperative antibiotics were dosed.  The hip and knee were flexed over a triangle and fluoroscopic imaging showed the unstable nature of her injury.  The tourniquet was inflated to 300 mmHg.  Total tourniquet time as noted above.  A standard anterior lateral approach to the proximal tibia was made and carried down through skin and subcutaneous tissue.  I incised through the IT band and developed the plane between the IT band and the capsule.  I then released the IT band off of the proximal lateral aspect of the tibial plateau and I released it until I could palpate the fibular head.  Based on the preoperative CT scan there was a splint that extended medially as well as impacted lateral joint.  Made a submeniscal arthrotomy and tagged the capsule for later repair with 0 Vicryl suture.  There was no meniscus tear visualized intra-articularly.  To access the impacted joint I made a small cortical window in the lateral condyle and used a footed tamp to reduce that articular surface and held it provisionally with a K wire.  Traction was then applied and reduction tenaculum was used to reduce the condyles together.  Once the condyles were reduced a 3.5 millimeter screw was placed from lateral to medial to hold this provisionally and I remove my clamp.  I then positioned a Synthes VA 3.5 mm proximal tibial locking plate and slid submuscularly along the lateral cortex of the tibia.  I placed a nonlocking screw proximally to bring the plate flush to bone.  I then percutaneously placed 3.5 millimeter screws into the tibial shaft to bring the distal portion of the plate flush to bone.  Once I was pleased with the alignment I then proceeded to place locking screws into the proximal portion of the bone.  I placed a medial kickstand screw to provide medial column support.  Final fluoroscopic imaging  was obtained.  The incisions were copiously irrigated.  A free needle was used to bring the tag sutures from the capsule back through the plate and tied these down.  A gram of vancomycin powder was placed.  The IT band was closed with 0 Vicryl suture.  The skin was closed with 2-0 Vicryl and 3-0 Monocryl and Dermabond.  Sterile dressings were applied.  We turned our attention to the left upper extremity.  Fluoroscopic imaging was obtained and showed the unstable nature of her injury.  I then made a standard FCR approach to the distal radius carried it down through skin and subcutaneous tissue.  Incised through the volar fascia of the FCR mobilized the FCR on the way and incised through the dorsal fascia.  I then sweep the finger flexors out of the way and released the pronator quadratus off of the distal radius to expose the fracture.  Reduction was performed by traction and gentle wrist flexion and a 1.6 mm K wire was placed at the radial styloid and across the metaphysis to hold the provisional reduction.  Another K wire was placed at the volar ulnar fragment and held it to the dorsal ulnar fragment.  A Synthes VA volar distal radius plate was then positioned and held provisionally with K wires and  I confirmed the position on x-ray.  I then placed a nonlocking screw into the radial shaft to bring the plate flush to bone and also buttress the volar fragments of the distal radius.  I then drilled and placed locking screws distally to fix the articular fragments.  I then placed 2 more nonlocking screws into the radial shaft.  K wires were removed final fluoroscopic imaging was obtained.  The incision was copiously irrigated.  A gram of vancomycin powder was placed into the incision.  A layered closure with 2-0 Vicryl 3-0 Monocryl and Steri-Strips were used to close the skin.  Sterile dressing was applied and a well-padded volar splint was placed.  The patient was then awoken from anesthesia and taken to the PACU  in stable condition.  Post Op Plan/Instructions: Patient will be nonweightbearing to the left lower extremity.  She will be weightbearing as tolerated to the elbow but nonweightbearing through the wrist.  She will receive postoperative Ancef.  She will be placed on Lovenox for DVT prophylaxis and transition to an oral DOAC at discharge.  We will have her mobilize with physical and Occupational Therapy.  I was present and performed the entire surgery.  Patrecia Pace, PA-C did assist me throughout the case. An assistant was necessary given the difficulty in approach, maintenance of reduction and ability to instrument the fracture.   Katha Hamming, MD [facility]

## 2022-06-16 NOTE — Anesthesia Procedure Notes (Signed)
Procedure Name: LMA Insertion Date/Time: 06/16/2022 3:28 PM  Performed by: Cathren Harsh, CRNAPre-anesthesia Checklist: Patient identified, Emergency Drugs available, Suction available and Patient being monitored Patient Re-evaluated:Patient Re-evaluated prior to induction Oxygen Delivery Method: Circle System Utilized Preoxygenation: Pre-oxygenation with 100% oxygen Induction Type: IV induction Ventilation: Mask ventilation without difficulty LMA: LMA inserted LMA Size: 4.0 Number of attempts: 1 Airway Equipment and Method: Bite block Placement Confirmation: positive ETCO2 Tube secured with: Tape Dental Injury: Teeth and Oropharynx as per pre-operative assessment

## 2022-06-17 ENCOUNTER — Inpatient Hospital Stay (HOSPITAL_COMMUNITY): Payer: Medicare Other

## 2022-06-17 DIAGNOSIS — S82102A Unspecified fracture of upper end of left tibia, initial encounter for closed fracture: Secondary | ICD-10-CM | POA: Diagnosis not present

## 2022-06-17 LAB — CBC WITH DIFFERENTIAL/PLATELET
Abs Immature Granulocytes: 0.03 10*3/uL (ref 0.00–0.07)
Basophils Absolute: 0 10*3/uL (ref 0.0–0.1)
Basophils Relative: 0 %
Eosinophils Absolute: 0 10*3/uL (ref 0.0–0.5)
Eosinophils Relative: 0 %
HCT: 30.5 % — ABNORMAL LOW (ref 36.0–46.0)
Hemoglobin: 10.3 g/dL — ABNORMAL LOW (ref 12.0–15.0)
Immature Granulocytes: 0 %
Lymphocytes Relative: 7 %
Lymphs Abs: 0.7 10*3/uL (ref 0.7–4.0)
MCH: 29.6 pg (ref 26.0–34.0)
MCHC: 33.8 g/dL (ref 30.0–36.0)
MCV: 87.6 fL (ref 80.0–100.0)
Monocytes Absolute: 0.8 10*3/uL (ref 0.1–1.0)
Monocytes Relative: 9 %
Neutro Abs: 8 10*3/uL — ABNORMAL HIGH (ref 1.7–7.7)
Neutrophils Relative %: 84 %
Platelets: 176 10*3/uL (ref 150–400)
RBC: 3.48 MIL/uL — ABNORMAL LOW (ref 3.87–5.11)
RDW: 13.4 % (ref 11.5–15.5)
WBC: 9.5 10*3/uL (ref 4.0–10.5)
nRBC: 0 % (ref 0.0–0.2)

## 2022-06-17 LAB — CK: Total CK: 1172 U/L — ABNORMAL HIGH (ref 38–234)

## 2022-06-17 LAB — BASIC METABOLIC PANEL
Anion gap: 9 (ref 5–15)
BUN: 21 mg/dL (ref 8–23)
CO2: 26 mmol/L (ref 22–32)
Calcium: 8.1 mg/dL — ABNORMAL LOW (ref 8.9–10.3)
Chloride: 101 mmol/L (ref 98–111)
Creatinine, Ser: 0.62 mg/dL (ref 0.44–1.00)
GFR, Estimated: >60 mL/min (ref >60)
Glucose, Bld: 118 mg/dL — ABNORMAL HIGH (ref 70–99)
Potassium: 3.8 mmol/L (ref 3.5–5.1)
Sodium: 136 mmol/L (ref 135–145)

## 2022-06-17 MED ORDER — ENOXAPARIN SODIUM 60 MG/0.6ML IJ SOSY
60.0000 mg | PREFILLED_SYRINGE | INTRAMUSCULAR | Status: DC
  Administered 2022-06-18 – 2022-06-21 (×4): 60 mg via SUBCUTANEOUS
  Filled 2022-06-17 (×4): qty 0.6

## 2022-06-17 MED ORDER — ENOXAPARIN SODIUM 30 MG/0.3ML IJ SOSY
30.0000 mg | PREFILLED_SYRINGE | Freq: Once | INTRAMUSCULAR | Status: AC
  Administered 2022-06-17: 30 mg via SUBCUTANEOUS
  Filled 2022-06-17: qty 0.3

## 2022-06-17 NOTE — Anesthesia Postprocedure Evaluation (Signed)
Anesthesia Post Note  Patient: Megan Walter  Procedure(s) Performed: OPEN REDUCTION INTERNAL FIXATION (ORIF) WRIST FRACTURE (Left: Wrist) OPEN REDUCTION INTERNAL FIXATION (ORIF) TIBIAL PLATEAU (Left: Leg Lower)     Patient location during evaluation: PACU Anesthesia Type: General Level of consciousness: awake and alert Pain management: pain level controlled Vital Signs Assessment: post-procedure vital signs reviewed and stable Respiratory status: spontaneous breathing, nonlabored ventilation and respiratory function stable Cardiovascular status: blood pressure returned to baseline Postop Assessment: no apparent nausea or vomiting Anesthetic complications: no   No notable events documented.          Marthenia Rolling

## 2022-06-17 NOTE — Evaluation (Signed)
Physical Therapy Evaluation Patient Details Name: Megan Walter MRN: [id number] DOB: [birth year] Today's Date: 06/17/2022  History of Present Illness  77 yo female presenting to ED on 2/22 after fall at home. Sustained L distal radius fx with nondisplaced fracture of the ulnar styloid process and L displaced proximal tibial metaphysis fx. S/p ORIF of L tibial fx and ORIF of L wrist fx on 2/23. PMH including osteoporosis and R tibial fx (2005).  Clinical Impression  Patient is s/p above surgery resulting in functional limitations due to the deficits listed below (see PT Problem List). Previously independent, from home with husband, strong family support with daughter in room during evaluation. Currently requires Mod A +2 for sit<>stand from bed with platform walker, and lateral scoot technique for transfers to chair. Would greatly benefit from AIR to improve functional independence prior to returning home. Patient will benefit from skilled PT to increase their independence and safety with mobility to allow discharge to the venue listed below.          Recommendations for follow up therapy are one component of a multi-disciplinary discharge planning process, led by the attending physician.  Recommendations may be updated based on patient status, additional functional criteria and insurance authorization.  Follow Up Recommendations Acute inpatient rehab (3hours/day)      Assistance Recommended at Discharge Frequent or constant Supervision/Assistance  Patient can return home with the following  A lot of help with bathing/dressing/bathroom;Assistance with cooking/housework;Assist for transportation;Help with stairs or ramp for entrance;Two people to help with walking and/or transfers    Equipment Recommendations Other (comment) (TBD next venue)  Recommendations for Other Services  Rehab consult    Functional Status Assessment Patient has had a recent decline in their functional status and  demonstrates the ability to make significant improvements in function in a reasonable and predictable amount of time.     Precautions / Restrictions Precautions Precautions: Fall Restrictions Weight Bearing Restrictions: Yes LUE Weight Bearing: Weight bear through elbow only LLE Weight Bearing: Non weight bearing      Mobility  Bed Mobility Overal bed mobility: Needs Assistance Bed Mobility: Supine to Sit     Supine to sit: Min assist, +2 for safety/equipment, HOB elevated     General bed mobility comments: Min A +2 for LLE management, CUes for technique, use of rail.    Transfers Overall transfer level: Needs assistance Equipment used: Left platform walker Transfers: Sit to/from Stand, Bed to chair/wheelchair/BSC Sit to Stand: Mod assist, +2 physical assistance, From elevated surface          Lateral/Scoot Transfers: Mod assist, +2 physical assistance General transfer comment: Mod A +2 for power up into standing; poor standing balance and power up. Difficulty positioning LUE due to nerve block. Cues for technique and assisted with alignment for elbow support. Mod A +2 for lateral scoot to drop arm recliner; cues for weight shift forward for propelling hips.    Ambulation/Gait                  Stairs            Wheelchair Mobility    Modified Rankin (Stroke Patients Only)       Balance Overall balance assessment: Needs assistance Sitting-balance support: No upper extremity supported, Feet supported Sitting balance-Leahy Scale: Good     Standing balance support: Bilateral upper extremity supported, During functional activity Standing balance-Leahy Scale: Poor  Pertinent Vitals/Pain Pain Assessment Pain Assessment: Faces Faces Pain Scale: Hurts even more Pain Location: LLE Pain Descriptors / Indicators: Grimacing, Heaviness, Pressure Pain Intervention(s): Monitored during session, Repositioned,  Limited activity within patient's tolerance    Home Living Family/patient expects to be discharged to:: Private residence Living Arrangements: Spouse/significant other Available Help at Discharge: Family;Available 24 hours/day Type of Home: House Home Access: Stairs to enter ((getting a ramp)) Entrance Stairs-Rails: Right;Left Entrance Stairs-Number of Steps: 2   Home Layout: One level Home Equipment: Cane - single point      Prior Function Prior Level of Function : Independent/Modified Independent;History of Falls (last six months);Driving             Mobility Comments: ind, only this fall reported. ADLs Comments: ind     Hand Dominance   Dominant Hand: Right    Extremity/Trunk Assessment   Upper Extremity Assessment Upper Extremity Assessment: Defer to OT evaluation LUE Deficits / Details: L distal radius fx with nondisplaced fracture of the ulnar styloid process. S/p ORIF. NWB wrist. Unable to perform ROM at digits due to nerve block. Able to bend elbow and shrugg shoulders; poor control of movement due to nerve block. LUE Sensation: decreased light touch LUE Coordination: decreased fine motor;decreased gross motor    Lower Extremity Assessment Lower Extremity Assessment: LLE deficits/detail LLE Deficits / Details: Able to actively flex ankle, good sensation distally. LLE bandaged. LLE: Unable to fully assess due to pain (NWB)    Cervical / Trunk Assessment Cervical / Trunk Assessment: Other exceptions Cervical / Trunk Exceptions: increased body habitus  Communication   Communication: No difficulties  Cognition Arousal/Alertness: Awake/alert Behavior During Therapy: WFL for tasks assessed/performed Overall Cognitive Status: Within Functional Limits for tasks assessed                                          General Comments General comments (skin integrity, edema, etc.): Daughter present and very supportive    Exercises General Exercises  - Lower Extremity Ankle Circles/Pumps: AROM, Both, 15 reps, Seated Quad Sets: Strengthening, Both, 10 reps, Seated Gluteal Sets: Strengthening, Both, 10 reps, Seated   Assessment/Plan    PT Assessment Patient needs continued PT services  PT Problem List Decreased strength;Decreased range of motion;Decreased activity tolerance;Decreased balance;Decreased mobility;Decreased knowledge of use of DME;Decreased knowledge of precautions;Obesity;Pain       PT Treatment Interventions DME instruction;Gait training;Functional mobility training;Therapeutic activities;Therapeutic exercise;Balance training;Neuromuscular re-education;Patient/family education;Wheelchair mobility training;Modalities    PT Goals (Current goals can be found in the Care Plan section)  Acute Rehab PT Goals Patient Stated Goal: Get well, have some therapy before going home again. PT Goal Formulation: With patient Time For Goal Achievement: 06/30/22 Potential to Achieve Goals: Good    Frequency Min 5X/week     Co-evaluation PT/OT/SLP Co-Evaluation/Treatment: Yes Reason for Co-Treatment: For patient/therapist safety;To address functional/ADL transfers PT goals addressed during session: Mobility/safety with mobility;Balance;Proper use of DME OT goals addressed during session: ADL's and self-care       AM-PAC PT "6 Clicks" Mobility  Outcome Measure Help needed turning from your back to your side while in a flat bed without using bedrails?: A Little Help needed moving from lying on your back to sitting on the side of a flat bed without using bedrails?: A Little Help needed moving to and from a bed to a chair (including a wheelchair)?: Total Help needed standing  up from a chair using your arms (e.g., wheelchair or bedside chair)?: Total Help needed to walk in hospital room?: Total Help needed climbing 3-5 steps with a railing? : Total 6 Click Score: 10    End of Session Equipment Utilized During Treatment: Gait  belt Activity Tolerance: Patient tolerated treatment well Patient left: in chair;with call bell/phone within reach;with chair alarm set;with family/visitor present Nurse Communication: Mobility status PT Visit Diagnosis: Unsteadiness on feet (R26.81);Other abnormalities of gait and mobility (R26.89);Muscle weakness (generalized) (M62.81);History of falling (Z91.81);Difficulty in walking, not elsewhere classified (R26.2);Pain Pain - Right/Left: Left Pain - part of body: Leg;Arm    Time: [id number] PT Time Calculation (min) (ACUTE ONLY): 32 min   Charges:   PT Evaluation $PT Eval Moderate Complexity: 1 Mod          Candie Mile, PT, DPT Physical Therapist Acute Rehabilitation Services [facility]   Ellouise Newer 06/17/2022, 2:21 PM

## 2022-06-17 NOTE — TOC CAGE-AID Note (Signed)
Transition of Care [facility]) - CAGE-AID Screening  Patient Details  Name: Megan Walter MRN: [id number] Date of Birth: Jul 07, 1945  Clinical Narrative:  Patient to ED after a fall at home causing a wrist fracture. Patient denies any current alcohol or drug use. No need for substance abuse resources at this time.  CAGE-AID Screening:   Have You Ever Felt You Ought to Cut Down on Your Drinking or Drug Use?: No Have People Annoyed You By Critizing Your Drinking Or Drug Use?: No Have You Felt Bad Or Guilty About Your Drinking Or Drug Use?: No Have You Ever Had a Drink or Used Drugs First Thing In The Morning to Steady Your Nerves or to Get Rid of a Hangover?: No CAGE-AID Score: 0  Substance Abuse Education Offered: No

## 2022-06-17 NOTE — Progress Notes (Signed)
Orthopaedic Trauma Service (OTS)  1 Day Post-Op Procedure(s) (LRB): OPEN REDUCTION INTERNAL FIXATION (ORIF) WRIST FRACTURE (Left) OPEN REDUCTION INTERNAL FIXATION (ORIF) TIBIAL PLATEAU (Left)  Subjective: Patient reports pain as  moderately severe; left arm block still in full effect with no voluntary control .    Objective: Current Vitals Blood pressure 123/80, pulse 98, temperature 98.2 F (36.8 C), temperature source Oral, resp. rate 17, height '5\' 5"'$  (1.651 m), weight 122.5 kg, SpO2 94 %. Vital signs in last 24 hours: Temp:  [97.6 F (36.4 C)-98.6 F (37 C)] 98.2 F (36.8 C) (02/24 0803) Pulse Rate:  [90-106] 98 (02/24 0803) Resp:  [12-20] 17 (02/24 0803) BP: (100-157)/(60-99) 123/80 (02/24 0803) SpO2:  [90 %-97 %] 94 % (02/24 0803)  Intake/Output from previous day: 02/23 0701 - 02/24 0700 In: 1575 [I.V.:1575] Out: 400 [Urine:350; Blood:50]  LABS Recent Labs    06/14/22 2029 06/16/22 0615 06/17/22 0317  HGB 13.6 11.2* 10.3*   Recent Labs    06/16/22 0615 06/17/22 0317  WBC 10.0 9.5  RBC 3.92 3.48*  HCT 33.7* 30.5*  PLT 200 176   Recent Labs    06/16/22 0425 06/17/22 0317  NA 139 136  K 3.8 3.8  CL 102 101  CO2 26 26  BUN 32* 21  CREATININE 0.79 0.62  GLUCOSE 105* 118*  CALCIUM 8.8* 8.1*   Recent Labs    06/14/22 2029  INR 1.0     Physical Exam LUE  Splint in place  Sens  Ax/R/M/U absent  Mot   Ax/ R/ PIN/ M/ AIN/ U absent  Brisk CR;' edema moderate LLE Knee immobilizer rubbing on heel and below the ankle; had to remove Dressing intact, clean, dry  Edema/ swelling mild to moderate above baseline  Sens: DPN, SPN, TN intact  Motor: EHL, FHL, and lessor toe ext and flex all intact grossly  Brisk cap refill, warm to touch, DP 2+  Assessment/Plan: 1 Day Post-Op Procedure(s) (LRB): OPEN REDUCTION INTERNAL FIXATION (ORIF) WRIST FRACTURE (Left) OPEN REDUCTION INTERNAL FIXATION (ORIF) TIBIAL PLATEAU (Left) 1. PT/OT  2. DVT proph Lovenox  will switch to weight based dosage for now 3. Discharge planning  Altamese [place], MD [facility], [facility] [phone]

## 2022-06-17 NOTE — Progress Notes (Signed)
ANTICOAGULATION CONSULT NOTE - Initial Consult  Pharmacy Consult for Enoxaparin Indication: VTE prophylaxis  No Known Allergies  Patient Measurements: Height: '5\' 5"'$  (165.1 cm) Weight: 122.5 kg (270 lb) IBW/kg (Calculated) : 57   Vital Signs: Temp: 98.2 F (36.8 C) (02/24 0803) Temp Source: Oral (02/24 0803) BP: 123/80 (02/24 0803) Pulse Rate: 98 (02/24 0803)  Labs: Recent Labs    06/14/22 2029 06/15/22 0917 06/16/22 0425 06/16/22 0615 06/17/22 0317  HGB 13.6  --   --  11.2* 10.3*  HCT 40.5  --   --  33.7* 30.5*  PLT 249  --   --  200 176  LABPROT 12.9  --   --   --   --   INR 1.0  --   --   --   --   CREATININE 0.56 0.63 0.79  --  0.62  CKTOTAL  --  1,080* 1,014*  --  1,172*    Estimated Creatinine Clearance: 79.8 mL/min (by C-G formula based on SCr of 0.62 mg/dL).   Medical History: History reviewed. No pertinent past medical history.  Medications:  Medications Prior to Admission  Medication Sig Dispense Refill Last Dose   calcium citrate (CALCITRATE - DOSED IN MG ELEMENTAL CALCIUM) 950 MG tablet Take 1 tablet by mouth daily.   Past Week   cetirizine (ZYRTEC) 10 MG tablet Take 1 tablet (10 mg total) by mouth daily. 30 tablet 3 Past Month   cholecalciferol (VITAMIN D) 1000 UNITS tablet Take 1,000 Units by mouth daily.   Past Week   fluticasone (FLONASE) 50 MCG/ACT nasal spray Place 2 sprays into both nostrils daily. 16 g 6 Past Month   nitrofurantoin (MACRODANTIN) 100 MG capsule Take 100 mg by mouth 2 (two) times daily.   Past Month    Assessment: 77 y.o female S/p ORIF of the left wrist fracture and left  tibial plateau fracture done 2/23. Started on Enoxaparin  40 mg every 24hr for VTE ppx.  Pharmacy consulted 2/24 for Enoxaparin VTE ppx.  Weight 122.5 kg,  BMI is 44.9, will increase Enoxaparin to 0.'5mg'$ /kg q24hr.  Hgb 13.5 on admit>>11.2>10.3 on POD#1,   pltc 200>176k.  No bleeding reported.  Already received '40mg'$  enoxaparin this morning.   Goal of  Therapy:  Monitor platelets by anticoagulation protocol: Yes   Plan:  Give extra Enoxaparin '30mg'$  x1 today 4/24 in addition to the '40mg'$  already given  this AM (~ 0.5 mg/kg total today) then tomorrow 4/25 start Enoxaparin  '60mg'$  sq q24h.  F/u CBC, Scr in AM   Thank you for allowing pharmacy to be part of this patients care team. Nicole Cella, [place] Pharmacist 06/17/2022,1:23 PM

## 2022-06-17 NOTE — Evaluation (Signed)
Occupational Therapy Evaluation Patient Details Name: Megan Walter MRN: [id number] DOB: 10-Oct-1945 Today's Date: 06/17/2022   History of Present Illness 77 yo female presenting to ED on 2/22 after fall at home. Sustained L distal radius fx with nondisplaced fracture of the ulnar styloid process and L displaced proximal tibial metaphysis fx. S/p ORIF of L tibial fx and ORIF of L wrist fx on 2/23. PMH including osteoporosis and R tibial fx (2005).   Clinical Impression   PTA, pt was living with her husband and was independent. Pt currently requiring Mod A for UB ADLs, Max A for LB ADLs, and Mod A +2 for functional transfers. Pt performing sit<>stand at EOB with platform walker and Mod A +2; difficulty maintaining balance with LLE NWB.  Pt performing lateral scoot to drop arm recliner with Mod A +2. Providing education on elevation of LUE and ROM exercises; nerve block still active and impacting AROM and sensation. Pt demonstrating high motivation and good rehab potential despite significant pain. Pt's family very supportive. Pt would benefit from further acute OT to facilitate safe dc. Recommend dc to AIR for intensive OT to optimize safety, independence with ADLs, and return to PLOF as well as decrease caregiver burden.     Recommendations for follow up therapy are one component of a multi-disciplinary discharge planning process, led by the attending physician.  Recommendations may be updated based on patient status, additional functional criteria and insurance authorization.   Follow Up Recommendations  Acute inpatient rehab (3hours/day)     Assistance Recommended at Discharge Frequent or constant Supervision/Assistance  Patient can return home with the following Two people to help with walking and/or transfers;Two people to help with bathing/dressing/bathroom;Assistance with cooking/housework;Assist for transportation;Help with stairs or ramp for entrance    Functional Status Assessment   Patient has had a recent decline in their functional status and demonstrates the ability to make significant improvements in function in a reasonable and predictable amount of time.  Equipment Recommendations  BSC/3in1;Wheelchair (measurements OT);Wheelchair cushion (measurements OT);Tub/shower bench (defer to next venue)    Recommendations for Other Services PT consult;Rehab consult     Precautions / Restrictions Precautions Precautions: Fall Restrictions Weight Bearing Restrictions: Yes LUE Weight Bearing: Weight bear through elbow only LLE Weight Bearing: Non weight bearing      Mobility Bed Mobility Overal bed mobility: Needs Assistance Bed Mobility: Supine to Sit     Supine to sit: Min assist, +2 for safety/equipment, HOB elevated     General bed mobility comments: Min A for LLE management    Transfers Overall transfer level: Needs assistance Equipment used: Left platform walker Transfers: Sit to/from Stand, Bed to chair/wheelchair/BSC Sit to Stand: Mod assist, +2 physical assistance, From elevated surface          Lateral/Scoot Transfers: Mod assist, +2 physical assistance General transfer comment: Mod A +2 for power up into standing; poor standing balance and power up. Mod A +2 for lateral scoot to drop arm recliner; cues for weight shift forward for propelling hips.      Balance Overall balance assessment: Needs assistance Sitting-balance support: No upper extremity supported, Feet supported Sitting balance-Leahy Scale: Good     Standing balance support: Bilateral upper extremity supported, During functional activity Standing balance-Leahy Scale: Poor                             ADL either performed or assessed with clinical judgement   ADL Overall  ADL's : Needs assistance/impaired Eating/Feeding: Minimal assistance;Sitting Eating/Feeding Details (indicate cue type and reason): Min A for bilateral tasks Grooming: Minimal  assistance;Sitting Grooming Details (indicate cue type and reason): Min A for bilateral tasks Upper Body Bathing: Moderate assistance;Sitting   Lower Body Bathing: Maximal assistance;+2 for physical assistance;Sit to/from stand   Upper Body Dressing : Moderate assistance;Sitting   Lower Body Dressing: Maximal assistance;+2 for physical assistance;Sit to/from stand   Toilet Transfer: Moderate assistance;+2 for physical assistance;Transfer board (lateral scoot to drop arm recliner) Toilet Transfer Details (indicate cue type and reason): Mod A +2 for weight shift and lateral scoot. Cues and education on forward weight shift and movement. Toileting- Clothing Manipulation and Hygiene: Maximal assistance;Bed level       Functional mobility during ADLs: Moderate assistance;+2 for physical assistance General ADL Comments: Pt with decreased strength, balance, ROM, and activity tolerance. Despite pain, pt very motivated and demonstrating poor rehab potential.     Vision         Perception     Praxis      Pertinent Vitals/Pain Pain Assessment Pain Assessment: Faces Faces Pain Scale: Hurts even more Pain Location: LLE Pain Descriptors / Indicators: Grimacing, Heaviness, Pressure Pain Intervention(s): Monitored during session, Limited activity within patient's tolerance, Premedicated before session, Repositioned     Hand Dominance Right   Extremity/Trunk Assessment Upper Extremity Assessment Upper Extremity Assessment: LUE deficits/detail LUE Deficits / Details: L distal radius fx with nondisplaced fracture of the ulnar styloid process. S/p ORIF. NWB wrist. Unable to perform ROM at digits due to nerve block. Able to bend elbow and shrugg shoulders; poor control of movement due to nerve block. LUE Sensation: decreased light touch LUE Coordination: decreased fine motor;decreased gross motor   Lower Extremity Assessment Lower Extremity Assessment: Defer to PT evaluation   Cervical /  Trunk Assessment Cervical / Trunk Assessment: Other exceptions Cervical / Trunk Exceptions: increased body habitus   Communication Communication Communication: No difficulties   Cognition Arousal/Alertness: Awake/alert Behavior During Therapy: WFL for tasks assessed/performed Overall Cognitive Status: Within Functional Limits for tasks assessed                                       General Comments  Daughter present and very supportive    Exercises     Shoulder Instructions      Home Living Family/patient expects to be discharged to:: Private residence Living Arrangements: Spouse/significant other Available Help at Discharge: Family;Available 24 hours/day Type of Home: House Home Access: Stairs to enter ((getting a ramp)) Entrance Stairs-Number of Steps: 2 Entrance Stairs-Rails: Right;Left Home Layout: One level     Bathroom Shower/Tub: Tub/shower unit;Curtain   Bathroom Toilet: Handicapped height Bathroom Accessibility: Yes   Home Equipment: Cane - single point          Prior Functioning/Environment Prior Level of Function : Independent/Modified Independent;History of Falls (last six months);Driving             Mobility Comments: ind, only this fall reported. ADLs Comments: ind        OT Problem List: Decreased strength;Decreased range of motion;Decreased activity tolerance;Impaired balance (sitting and/or standing);Decreased knowledge of use of DME or AE;Decreased knowledge of precautions;Pain      OT Treatment/Interventions: Self-care/ADL training;Therapeutic exercise;Energy conservation;DME and/or AE instruction;Therapeutic activities;Patient/family education    OT Goals(Current goals can be found in the care plan section) Acute Rehab OT Goals Patient Stated Goal:  Get stronger OT Goal Formulation: With patient/family Time For Goal Achievement: 07/01/22 Potential to Achieve Goals: Good  OT Frequency: Min 3X/week (UE injury)     Co-evaluation PT/OT/SLP Co-Evaluation/Treatment: Yes Reason for Co-Treatment: For patient/therapist safety;To address functional/ADL transfers   OT goals addressed during session: ADL's and self-care      AM-PAC OT "6 Clicks" Daily Activity     Outcome Measure Help from another person eating meals?: A Little Help from another person taking care of personal grooming?: A Little Help from another person toileting, which includes using toliet, bedpan, or urinal?: A Lot Help from another person bathing (including washing, rinsing, drying)?: A Lot Help from another person to put on and taking off regular upper body clothing?: A Lot Help from another person to put on and taking off regular lower body clothing?: A Lot 6 Click Score: 14   End of Session Equipment Utilized During Treatment: Gait belt Nurse Communication: Mobility status;Need for lift equipment  Activity Tolerance: Patient tolerated treatment well Patient left: in chair;with call bell/phone within reach;with chair alarm set;with family/visitor present  OT Visit Diagnosis: Unsteadiness on feet (R26.81);Other abnormalities of gait and mobility (R26.89);Muscle weakness (generalized) (M62.81);Pain Pain - Right/Left: Left Pain - part of body: Leg;Arm                Time: [id number] OT Time Calculation (min): 32 min Charges:  OT General Charges $OT Visit: 1 Visit OT Evaluation $OT Eval Moderate Complexity: 1 Mod  Socorro Kanitz MSOT, OTR/L Acute Rehab Office: [place] 06/17/2022, 1:52 PM

## 2022-06-17 NOTE — Progress Notes (Signed)
PROGRESS NOTE  Megan Walter  DOB: Oct 18, 1945  PCP: Owens Loffler, DO [id number]  DOA: 06/14/2022  LOS: 2 days  Hospital Day: 4  Brief narrative: Megan Walter is a 77 y.o. female with PMH significant for osteoporosis 2/21, patient presented to the ED after a fall leading to immediate left arm and left leg pain.  Patient reportedly was coming out of her garage when her foot slipped on the step and she fell down landing on her left side.  Denies any loss of consciousness or trauma to her head.   In the ED, afebrile, hemodynamically stable X-rays of the left leg revealed acute communicated and displaced fractures of the proximal tibial metaphysis and transverse communicated and mildly displaced fractures of the proximal fibular shaft.  X-rays of the left wrist noted noted communicated impacted fractures of the distal left radius with nondisplaced fracture of the ulnar styloid process.   Labs 2/21 showed WBC 17.7 and potassium 5.2.   EDP applied patient's left wrist in a sugar-tong splint.   Orthopedics was consulted with plans for ORIF of the left wrist fracture and tibial plateau fracture.  Started on pain meds. Admitted to [facility]  Subjective: Patient was seen and examined this morning.  Sitting up at the edge of the bed.  Working with physical therapist.  Daughter at bedside. No new symptoms. Chart reviewed Afebrile, blood pressure in 140s and 150s overnight Labs from this morning with normal WBC count, CK over thousand, creatinine normal,  Assessment and plan: Left wrist and left tibial plateau fracture  secondary to fall X-rays as above. Orthopedics consult appreciated. 2/23, patient underwent ORIF of left tibial plateau fracture and left distal radius fractures. Continue pain management with IV and oral opiates as needed DVT prophylaxis on Lovenox per orthopedics. Bowel regimen scheduled and as needed  Rhabdomyolysis CK level elevated over thousand.  Increased levels  today probably because of muscle manipulation with surgery yesterday.  Continue IV fluid.  Repeat CK level in the morning. Recent Labs  Lab 06/15/22 0917 06/16/22 0425 06/17/22 0317  CKTOTAL 1,080* 1,014* 1,172*    Osteoporosis Chronic.  Obtain vitamin D level.    Morbid Obesity  Body mass index is 44.93 kg/m. Patient has been advised to make an attempt to improve diet and exercise patterns to aid in weight loss.      Mobility: Encourage ambulation.  Working with PT this morning.  Goals of care   Code Status: Full Code     DVT prophylaxis:  SCDs Start: 06/16/22 1838 enoxaparin (LOVENOX) injection 40 mg Start: 06/15/22 0930   Antimicrobials: None Fluid: NS at 125 mill per hour to continue Consultants: Orthopedics Family Communication: Daughter Marlowe Kays at bedside  Status is: Inpatient Level of care: Med-Surg   Dispo: Patient is from: Home              Anticipated d/c is to: Home with PT versus SNF. Continue in-hospital care because: POD 1, pending PT eval   Scheduled Meds:  docusate sodium  100 mg Oral BID   enoxaparin (LOVENOX) injection  40 mg Subcutaneous Q24H   melatonin  3 mg Oral QHS   senna-docusate  1 tablet Oral QHS    PRN meds: acetaminophen, fluticasone, HYDROcodone-acetaminophen, HYDROcodone-acetaminophen, loratadine, methocarbamol **OR** methocarbamol (ROBAXIN) IV, metoCLOPramide **OR** metoCLOPramide (REGLAN) injection, morphine injection, ondansetron (ZOFRAN) IV, polyethylene glycol   Infusions:   sodium chloride 125 mL/hr at 06/16/22 0854    ceFAZolin (ANCEF) IV 2 g (06/17/22 [id number])  methocarbamol (ROBAXIN) IV      Diet:  Diet Order             Diet regular Room service appropriate? Yes; Fluid consistency: Thin  Diet effective now                   Antimicrobials: Anti-infectives (From admission, onward)    Start     Dose/Rate Route Frequency Ordered Stop   06/16/22 2200  ceFAZolin (ANCEF) IVPB 2g/100 mL premix        2 g 200  mL/hr over 30 Minutes Intravenous Every 8 hours 06/16/22 1837 06/17/22 2159   06/16/22 1624  vancomycin (VANCOCIN) powder  Status:  Discontinued          As needed 06/16/22 1625 06/16/22 1723   06/16/22 0600  ceFAZolin (ANCEF) IVPB 3g/100 mL premix        3 g 200 mL/hr over 30 Minutes Intravenous On call to O.R. 06/15/22 2102 06/16/22 1532       Skin assessment:       Nutritional status:  Body mass index is 44.93 kg/m.          Objective: Vitals:   06/17/22 0429 06/17/22 0803  BP: 129/60 123/80  Pulse: (!) 106 98  Resp: 16 17  Temp: 98.1 F (36.7 C) 98.2 F (36.8 C)  SpO2: 90% 94%    Intake/Output Summary (Last 24 hours) at 06/17/2022 1146 Last data filed at 06/17/2022 0900 Gross per 24 hour  Intake 1620 ml  Output 400 ml  Net 1220 ml    Filed Weights   06/14/22 1736  Weight: 122.5 kg   Weight change:  Body mass index is 44.93 kg/m.   Physical Exam: General exam: Pleasant, elderly.  Pain controlled.  Legs are weak. Skin: No rashes, lesions or ulcers. HEENT: Atraumatic, normocephalic, no obvious bleeding Lungs: Clear to auscultation bilaterally CVS: Regular rate and rhythm, no murmur GI/Abd soft, nontender, nondistended, bowel sound present CNS: Alert, awake, oriented x 3 Psychiatry: Mood appropriate Extremities: Chronically big legs which she does not want believe any edema.  Left upper and lower extremity braced  Data Review: I have personally reviewed the laboratory data and studies available.  F/u labs ordered Unresulted Labs (From admission, onward)     Start     Ordered   06/18/22 0500  CK  Tomorrow morning,   R       Question:  Specimen collection method  Answer:  Lab=Lab collect   06/17/22 0738   06/18/22 0500  VITAMIN D 25 Hydroxy (Vit-D Deficiency, Fractures)  Tomorrow morning,   R       Question:  Specimen collection method  Answer:  Lab=Lab collect   06/17/22 1052   06/18/22 0500  CBC  Tomorrow morning,   R       Question:  Specimen  collection method  Answer:  Lab=Lab collect   06/17/22 1052   Signed and Held  CBC  Daily,   R     Question:  Specimen collection method  Answer:  Lab=Lab collect   Signed and Held            Total time spent in review of labs and imaging, patient evaluation, formulation of plan, documentation and communication with family: 45 minutes  Signed, Terrilee Croak, MD [facility] 06/17/2022

## 2022-06-18 DIAGNOSIS — S82102A Unspecified fracture of upper end of left tibia, initial encounter for closed fracture: Secondary | ICD-10-CM | POA: Diagnosis not present

## 2022-06-18 LAB — CBC
HCT: 29.4 % — ABNORMAL LOW (ref 36.0–46.0)
Hemoglobin: 9.7 g/dL — ABNORMAL LOW (ref 12.0–15.0)
MCH: 28.9 pg (ref 26.0–34.0)
MCHC: 33 g/dL (ref 30.0–36.0)
MCV: 87.5 fL (ref 80.0–100.0)
Platelets: 202 10*3/uL (ref 150–400)
RBC: 3.36 MIL/uL — ABNORMAL LOW (ref 3.87–5.11)
RDW: 13.3 % (ref 11.5–15.5)
WBC: 7.9 10*3/uL (ref 4.0–10.5)
nRBC: 0 % (ref 0.0–0.2)

## 2022-06-18 LAB — CREATININE, SERUM
Creatinine, Ser: 0.7 mg/dL (ref 0.44–1.00)
GFR, Estimated: >60 mL/min (ref >60)

## 2022-06-18 LAB — VITAMIN D 25 HYDROXY (VIT D DEFICIENCY, FRACTURES): Vit D, 25-Hydroxy: 17.16 ng/mL — ABNORMAL LOW (ref 30–100)

## 2022-06-18 LAB — CK: Total CK: 1109 U/L — ABNORMAL HIGH (ref 38–234)

## 2022-06-18 NOTE — Progress Notes (Signed)
IP rehab admissions - patient screened for potential acute inpatient rehab admission.  Per protocal order place for a rehab consult.  Call me for questions.  [phone]

## 2022-06-18 NOTE — Progress Notes (Signed)
Orthopaedic Trauma Service Progress Note  Patient ID: Megan Walter MRN: [id number] DOB/AGE: 75-18-1947 75 y.o.  Subjective:  No complaints today Pain controlled Ortho issues stable    ROS As above  Objective:   VITALS:   Vitals:   06/17/22 1352 06/17/22 2114 06/17/22 2130 06/18/22 0212  BP: (!) 141/63 120/61  125/63  Pulse: 98 92  86  Resp: '17 16  16  '$ Temp: 98.3 F (36.8 C) 98.3 F (36.8 C)  98.3 F (36.8 C)  TempSrc: Oral Oral  Oral  SpO2: 96% (!) 89% 94% 91%  Weight:      Height:        Estimated body mass index is 44.93 kg/m as calculated from the following:   Height as of this encounter: '5\' 5"'$  (1.651 m).   Weight as of this encounter: 122.5 kg.   Intake/Output      02/24 0701 02/25 0700 02/25 0701 02/26 0700   P.O. 120    I.V. (mL/kg)     Total Intake(mL/kg) 120 (1)    Urine (mL/kg/hr)     Blood     Total Output     Net +120         Urine Occurrence 1 x    Stool Occurrence 1 x      LABS  Results for orders placed or performed during the hospital encounter of 06/14/22 (from the past 24 hour(s))  CK     Status: Abnormal   Collection Time: 06/18/22  3:29 AM  Result Value Ref Range   Total CK 1,109 (H) 38 - 234 U/L  CBC     Status: Abnormal   Collection Time: 06/18/22  3:29 AM  Result Value Ref Range   WBC 7.9 4.0 - 10.5 K/uL   RBC 3.36 (L) 3.87 - 5.11 MIL/uL   Hemoglobin 9.7 (L) 12.0 - 15.0 g/dL   HCT 29.4 (L) 36.0 - 46.0 %   MCV 87.5 80.0 - 100.0 fL   MCH 28.9 26.0 - 34.0 pg   MCHC 33.0 30.0 - 36.0 g/dL   RDW 13.3 11.5 - 15.5 %   Platelets 202 150 - 400 K/uL   nRBC 0.0 0.0 - 0.2 %  Creatinine, serum     Status: None   Collection Time: 06/18/22  3:29 AM  Result Value Ref Range   Creatinine, Ser 0.70 0.44 - 1.00 mg/dL   GFR, Estimated >60 >60 mL/min     PHYSICAL EXAM:   Gen: resting comfortably in bed, NAD, appears well  Lungs: unlabored Ext:      Left  upper extremity  Splint L wrist fitting well Moderate swelling but improved from yesterday  Radial,ulnar, median nv motor and sensory functions intact AIN and PIN motor intact Ext warm  Brisk cap refill  Good digit motion        Left Lower extremity  Dressing clean, dry and intact Ext warm  + DP pulse No DCT Compartments soft No pain with passive stretch  Motor and sensory functions intact     Assessment/Plan: 2 Days Post-Op     Anti-infectives (From admission, onward)    Start     Dose/Rate Route Frequency Ordered Stop   06/16/22 2200  ceFAZolin (ANCEF) IVPB 2g/100 mL premix        2 g 200  mL/hr over 30 Minutes Intravenous Every 8 hours 06/16/22 1837 06/17/22 1422   06/16/22 1624  vancomycin (VANCOCIN) powder  Status:  Discontinued          As needed 06/16/22 1625 06/16/22 1723   06/16/22 0600  ceFAZolin (ANCEF) IVPB 3g/100 mL premix        3 g 200 mL/hr over 30 Minutes Intravenous On call to O.R. 06/15/22 2102 06/16/22 1532     .  POD/HD#: 2  77 y/o female s/p fall with left tibial plateau fracture and left distal radius fracture  -Left tibial plateau fracture s/p ORIF  Weightbearing Nonweightbearing left lower extremity  Mobilize with platform walker    ROM/Activity   Unrestricted ROM L knee    Wound care   Dressing changes as needed   PT/OT evals  Ice and elevate for swelling   - L distal radius fracture   Weightbearing   NWB thru wrist but can WB thru elbow with platform walker     ROM   Digit, elbow, forearm, wrist and hand motion as tolerated   Wound Care   Splint x 2 weeks     Do not remove splint   Ice and elevate   Elevate hand above elbow and elbow above heart when possible    Sling for comfort   - Pain management:  Multimodal   - ABL anemia/Hemodynamics  Stable  - Medical issues   Per primary   - DVT/PE prophylaxis:  Lovenox weightbased   - Metabolic Bone Disease:  Vitamin d pending   Mechanism of fracture suggestive of  osteoporosis  Dexa as outpt  - Activity:  As above  - FEN/GI prophylaxis/Foley/Lines:  Reg diet   - Dispo:  CIR consult placed  Continue therapies   Ortho issues stable     [name], PA-C [phone] (C) 06/18/2022, 11:23 AM  [facility] [place] [address] [phone] Jenetta Downer) [phone] (F)    After 5pm and on the weekends please log on to Amion, go to orthopaedics and the look under the [facility] Call for the provider(s) on call. You can also call our office at [phone] and then follow the prompts to be connected to the call team.  Patient ID: Megan Walter, female   DOB: Dec 17, 1945, 77 y.o.   MRN: [id number]

## 2022-06-18 NOTE — Progress Notes (Signed)
PROGRESS NOTE  Megan Walter  DOB: 1945-07-12  PCP: Owens Loffler, DO [id number]  DOA: 06/14/2022  LOS: 3 days  Hospital Day: 5  Brief narrative: Megan Walter is a 77 y.o. female with PMH significant for osteoporosis 2/21, patient presented to the ED after a fall leading to immediate left arm and left leg pain.  Patient reportedly was coming out of her garage when her foot slipped on the step and she fell down landing on her left side.  Denies any loss of consciousness or trauma to her head.   In the ED, afebrile, hemodynamically stable X-rays of the left leg revealed acute communicated and displaced fractures of the proximal tibial metaphysis and transverse communicated and mildly displaced fractures of the proximal fibular shaft.  X-rays of the left wrist noted noted communicated impacted fractures of the distal left radius with nondisplaced fracture of the ulnar styloid process.   Labs 2/21 showed WBC 17.7 and potassium 5.2.   EDP applied patient's left wrist in a sugar-tong splint.   Orthopedics was consulted with plans for ORIF of the left wrist fracture and tibial plateau fracture.  Started on pain meds. Admitted to [facility]  Subjective: Patient was seen and examined this morning.  Propped up in bed.  Not in distress.  Daughter and son-in-law at bedside.   PT eval obtained.  CIR was recommended.  Assessment and plan: Left wrist and left tibial plateau fracture  secondary to fall X-rays as above. Orthopedics consult appreciated. 2/23, patient underwent ORIF of left tibial plateau fracture and left distal radius fractures. Continue pain management with IV and oral opiates as needed DVT prophylaxis on Lovenox per orthopedics. Bowel regimen scheduled and as needed  Impaired mobility Per orthopedics, NWB LLE and NWB through wrist but can weight-bear through elbow with platform. Seen by PT.  CM recommended.  Bilateral lower extremity lymphedema Patient states her legs are  always swollen.  No history of CHF.  Not on diuretics at home  Rhabdomyolysis CK level continues to remain elevated.  Somewhat better today.  Continue IV hydration at a reduced rate today. Recent Labs  Lab 06/15/22 0917 06/16/22 0425 06/17/22 0317 06/18/22 0329  CKTOTAL 1,080* 1,014* 1,172* 1,109*   Osteoporosis Chronic.  Obtain vitamin D level.    Morbid Obesity  Body mass index is 44.93 kg/m. Patient has been advised to make an attempt to improve diet and exercise patterns to aid in weight loss.   Goals of care   Code Status: Full Code     DVT prophylaxis:  SCDs Start: 06/16/22 [id number]   Antimicrobials: None Fluid: NS at 73 mill per hour Consultants: Orthopedics Family Communication: Daughter Amy at bedside  Status is: Inpatient Level of care: Med-Surg   Dispo: Patient is from: Home              Anticipated d/c is to: CIR Continue in-hospital care because: Pending CIR   Scheduled Meds:  docusate sodium  100 mg Oral BID   enoxaparin (LOVENOX) injection  60 mg Subcutaneous Q24H   melatonin  3 mg Oral QHS   senna-docusate  1 tablet Oral QHS    PRN meds: acetaminophen, fluticasone, HYDROcodone-acetaminophen, HYDROcodone-acetaminophen, loratadine, methocarbamol **OR** methocarbamol (ROBAXIN) IV, metoCLOPramide **OR** metoCLOPramide (REGLAN) injection, morphine injection, ondansetron (ZOFRAN) IV, polyethylene glycol   Infusions:   sodium chloride 75 mL/hr at 06/18/22 0957   methocarbamol (ROBAXIN) IV      Diet:  Diet Order  Diet regular Room service appropriate? Yes; Fluid consistency: Thin  Diet effective now                   Antimicrobials: Anti-infectives (From admission, onward)    Start     Dose/Rate Route Frequency Ordered Stop   06/16/22 2200  ceFAZolin (ANCEF) IVPB 2g/100 mL premix        2 g 200 mL/hr over 30 Minutes Intravenous Every 8 hours 06/16/22 1837 06/17/22 1422   06/16/22 1624  vancomycin (VANCOCIN) powder  Status:   Discontinued          As needed 06/16/22 1625 06/16/22 1723   06/16/22 0600  ceFAZolin (ANCEF) IVPB 3g/100 mL premix        3 g 200 mL/hr over 30 Minutes Intravenous On call to O.R. 06/15/22 2102 06/16/22 1532       Skin assessment:       Nutritional status:  Body mass index is 44.93 kg/m.          Objective: Vitals:   06/17/22 2130 06/18/22 0212  BP:  125/63  Pulse:  86  Resp:  16  Temp:  98.3 F (36.8 C)  SpO2: 94% 91%   No intake or output data in the 24 hours ending 06/18/22 1359  Filed Weights   06/14/22 1736  Weight: 122.5 kg   Weight change:  Body mass index is 44.93 kg/m.   Physical Exam: General exam: Pleasant, elderly.  Pain controlled.  Legs are weak. Skin: No rashes, lesions or ulcers. HEENT: Atraumatic, normocephalic, no obvious bleeding Lungs: Clear to auscultation bilaterally CVS: Regular rate and rhythm, no murmur GI/Abd soft, nontender, nondistended, bowel sound present CNS: Alert, awake, oriented x 3 Psychiatry: Mood appropriate Extremities: Chronically big legs which she does not want believe any edema.  Left upper and lower extremity braced  Data Review: I have personally reviewed the laboratory data and studies available.  F/u labs ordered Unresulted Labs (From admission, onward)     Start     Ordered   06/19/22 0500  CBC  Tomorrow morning,   R       Question:  Specimen collection method  Answer:  Lab=Lab collect   06/18/22 1143            Total time spent in review of labs and imaging, patient evaluation, formulation of plan, documentation and communication with family: 11 minutes  Signed, Terrilee Croak, MD [facility] 06/18/2022

## 2022-06-19 DIAGNOSIS — S82142A Displaced bicondylar fracture of left tibia, initial encounter for closed fracture: Secondary | ICD-10-CM

## 2022-06-19 DIAGNOSIS — S82102A Unspecified fracture of upper end of left tibia, initial encounter for closed fracture: Secondary | ICD-10-CM | POA: Diagnosis not present

## 2022-06-19 LAB — CBC
HCT: 29.6 % — ABNORMAL LOW (ref 36.0–46.0)
Hemoglobin: 9.6 g/dL — ABNORMAL LOW (ref 12.0–15.0)
MCH: 28.8 pg (ref 26.0–34.0)
MCHC: 32.4 g/dL (ref 30.0–36.0)
MCV: 88.9 fL (ref 80.0–100.0)
Platelets: 220 10*3/uL (ref 150–400)
RBC: 3.33 MIL/uL — ABNORMAL LOW (ref 3.87–5.11)
RDW: 13.2 % (ref 11.5–15.5)
WBC: 7.1 10*3/uL (ref 4.0–10.5)
nRBC: 0 % (ref 0.0–0.2)

## 2022-06-19 MED ORDER — VITAMIN D (ERGOCALCIFEROL) 1.25 MG (50000 UNIT) PO CAPS
50000.0000 [IU] | ORAL_CAPSULE | ORAL | Status: DC
  Administered 2022-06-19 – 2022-06-26 (×2): 50000 [IU] via ORAL
  Filled 2022-06-19 (×2): qty 1

## 2022-06-19 NOTE — Progress Notes (Signed)
Physical Therapy Treatment Patient Details Name: Megan Walter MRN: [id number] DOB: 04-23-1946 Today's Date: 06/19/2022   History of Present Illness 77 yo female presenting to ED on 2/22 after fall at home. Sustained L distal radius fx with nondisplaced fracture of the ulnar styloid process and L displaced proximal tibial metaphysis fx. S/p ORIF of L tibial fx and ORIF of L wrist fx on 2/23. PMH including osteoporosis and R tibial fx (2005).    PT Comments    Pt was received sitting in recliner and agreeable to session. Pt required up to max A +2 to stand from lower surface of the recliner this session. Pt required cues for LLE NWB with pt stating "I'm doing my best". Pt required cues for safe hand placement and sequencing for standing and transfer tasks. Pt able to use gait belt for LLE management requiring dense cues for technique and mod A to elevate LLE due to pain and fatigue. Pt continues to benefit from PT services to progress toward functional mobility goals.    Recommendations for follow up therapy are one component of a multi-disciplinary discharge planning process, led by the attending physician.  Recommendations may be updated based on patient status, additional functional criteria and insurance authorization.  Follow Up Recommendations  Acute inpatient rehab (3hours/day)     Assistance Recommended at Discharge Frequent or constant Supervision/Assistance  Patient can return home with the following A lot of help with bathing/dressing/bathroom;Assistance with cooking/housework;Assist for transportation;Help with stairs or ramp for entrance;Two people to help with walking and/or transfers   Equipment Recommendations  Other (comment)    Recommendations for Other Services       Precautions / Restrictions Precautions Precautions: Fall Restrictions Weight Bearing Restrictions: Yes LUE Weight Bearing: Weight bear through elbow only LLE Weight Bearing: Non weight bearing      Mobility  Bed Mobility Overal bed mobility: Needs Assistance Bed Mobility: Sit to Supine       Sit to supine: Mod assist   General bed mobility comments: Mod A for LLE elevation to EOB and dense cues for use of gait belt for LLE management    Transfers Overall transfer level: Needs assistance Equipment used: Left platform walker Transfers: Sit to/from Stand, Bed to chair/wheelchair/BSC Sit to Stand: Max assist, +2 physical assistance Stand pivot transfers: Mod assist, +2 physical assistance         General transfer comment: from recliner to platform walker with max A +2 for power up after multiple attempts. Dense cues for LLE NWB. Pt able to pivot on RLE with mod A +2 to EOB.    Ambulation/Gait               General Gait Details: unable      Balance Overall balance assessment: Needs assistance Sitting-balance support: No upper extremity supported, Feet supported Sitting balance-Leahy Scale: Good Sitting balance - Comments: at EOB and in recliner   Standing balance support: Bilateral upper extremity supported, During functional activity, Reliant on assistive device for balance Standing balance-Leahy Scale: Poor Standing balance comment: with L platform RW support                            Cognition Arousal/Alertness: Awake/alert Behavior During Therapy: WFL for tasks assessed/performed Overall Cognitive Status: Within Functional Limits for tasks assessed  Exercises      General Comments General comments (skin integrity, edema, etc.): Family present for support      Pertinent Vitals/Pain Pain Assessment Pain Assessment: 0-10 Pain Score: 6  Pain Descriptors / Indicators: Grimacing Pain Intervention(s): Limited activity within patient's tolerance, Monitored during session, Repositioned     PT Goals (current goals can now be found in the care plan section) Acute Rehab PT  Goals Patient Stated Goal: Get well, have some therapy before going home again. PT Goal Formulation: With patient Time For Goal Achievement: 06/30/22 Potential to Achieve Goals: Good Progress towards PT goals: Progressing toward goals    Frequency    Min 5X/week      PT Plan Current plan remains appropriate       AM-PAC PT "6 Clicks" Mobility   Outcome Measure  Help needed turning from your back to your side while in a flat bed without using bedrails?: A Little Help needed moving from lying on your back to sitting on the side of a flat bed without using bedrails?: A Little Help needed moving to and from a bed to a chair (including a wheelchair)?: Total Help needed standing up from a chair using your arms (e.g., wheelchair or bedside chair)?: Total Help needed to walk in hospital room?: Total Help needed climbing 3-5 steps with a railing? : Total 6 Click Score: 10    End of Session Equipment Utilized During Treatment: Gait belt Activity Tolerance: Patient tolerated treatment well Patient left: with call bell/phone within reach;with family/visitor present;in bed;with bed alarm set Nurse Communication: Mobility status PT Visit Diagnosis: Unsteadiness on feet (R26.81);Other abnormalities of gait and mobility (R26.89);Muscle weakness (generalized) (M62.81);History of falling (Z91.81);Difficulty in walking, not elsewhere classified (R26.2);Pain     Time: 1150-1206 PT Time Calculation (min) (ACUTE ONLY): 16 min  Charges:  $Therapeutic Activity: 8-22 mins                     Michelle Nasuti, PTA Acute Rehabilitation Services Secure Chat Preferred  [phone]    Michelle Nasuti 06/19/2022, 12:36 PM

## 2022-06-19 NOTE — Care Management Important Message (Signed)
Important Message  Patient Details  Name: Megan Walter MRN: [id number] Date of Birth: 1946-04-02   Medicare Important Message Given:  Yes     Hannah Beat 06/19/2022, 3:13 PM

## 2022-06-19 NOTE — Progress Notes (Signed)
Inpatient Rehab Admissions Coordinator:    I met with pt. And daughter Amy regarding potential CIR admit. They are interested and state that Amy and her other sister and pt.'s spouse can provide 24/7 min A at d/c. Discussed likely 16-18 day LOS with goals of discharging at supervision to min assist level. They are in agreement with this plan. I will open a case with insurance and pursue for admit.   Clemens Catholic, [place], [place] Admissions Coordinator  [phone] ([place]) [phone] (office)

## 2022-06-19 NOTE — PMR Pre-admission (Signed)
PMR Admission Coordinator Pre-Admission Assessment  Patient: Megan Walter is an 77 y.o., female MRN: [id number] DOB: 12/15/1945 Height: '5\' 5"'$  (165.1 cm) Weight: 124.1 kg              Insurance Information HMO:  yes    PPO:      PCP:      IPA:      80/20:      OTHER:  PRIMARY: Blue Medicare Enhanced       Policy#: [id number]      Subscriber: pt CM Name: shannon      Phone#: [phone]     Fax#: [phone] Pre-Cert#: [id number] Employer:  Raquel at [facility] [facility] [employer] called with appeal overturn stating Pt. Approved for admit 3/4-07/01/22.  Benefits:  Phone #: Myra at [phone] Eff Date: 04/24/2022 - still active Deductible: does not have one OOP Max: $3,150 ($0 met) CIR: $335/day co-pay for days 1-5 SNF: $0.00 Copayment per day for days 1-20; $203 Copayment per day for days 21-60; $0.00 copayment for days 61-100 Maximum of 100 days/benefit period Outpatient:  $10 copay/visit Home Health:  100% coverage DME: 80% coverage, 20% co-insurance Providers: in network   SECONDARY:       Policy#:       Phone#:   Development worker, community:       Phone#:   The Engineer, petroleum" for patients in Inpatient Rehabilitation Facilities with attached "Privacy Act [place] Records" was provided and verbally reviewed with: Patient  Emergency Contact Information Contact Information     Name Relation Home Work Mobile   [place] Spouse [id number]     [facility] Daughter  [phone]   [place] Daughter   [phone]      Current Medical History  Patient Admitting Diagnosis: L tibia fracture, L wrist fracture  History of Present Illness: Megan Walter is a morbidly obese 77 y.o. female with a past medical history significant for osteoporosis.  She fell on 2/221/2024 and was taken to the emergency department with immediate onset left arm and left leg pain.  .  X-rays of the left leg demonstrated acute comminuted and displaced fractures of the  proximal tibial metaphysis and transverse comminuted and mildly displaced fractures of the proximal fibular shaft.  X-rays of the wrist demonstrated comminuted impacted fractures of the distal left radius with nondisplaced fracture of the left ulnar styloid process.  Orthopedics was consulted and the patient underwent ORIF of the left wrist fracture and ORIF of the left tibial plateau fracture on 06/16/2022.  There were no postoperative anesthetic complications.CAGE screening was negative Initial Occupational Therapy evaluation was performed on 06/17/2022.  Previous functional status was independent.  Current functional status postoperative mod assist for upper body ADLs max assist lower body ADLs mod assist +2 for functional transfers.  Sit to stand with mod assist x 2 Restrictions include nonweightbearing status left lower limb, left upper extremity weightbearing through elbow only. Recommended DVT prophylaxis Eliquis 2.5 mg twice daily for 30 days, cuurently on lovenox .  Patient also noted to have vitamin D deficiency and vitamin D 50,000 units weekly x 8 weeks was ordered.  Patient also had rhabdomyolysis with CK up to 1109 on 06/18/2022.  Kidney functions however were normal.  Initial hemoglobin was 11.2 on 06/16/2022 which dropped to 9.6 postoperatively on 06/19/2022 but no transfusion was required   Glasgow Coma Scale Score: 15  Patient's medical record from [facility]  has been reviewed by the rehabilitation admission coordinator and physician.  Past Medical History  History reviewed. No pertinent past medical history.  Has the patient had major surgery during 100 days prior to admission? Yes  Family History  family history includes Heart attack in her mother; Hypertension in her mother.   Current Medications   Current Facility-Administered Medications:    0.9 %  sodium chloride infusion, , Intravenous, Continuous, Dahal, Binaya, MD, Last Rate: 75 mL/hr at 06/18/22 0957,  Rate Change at 06/18/22 0957   acetaminophen (TYLENOL) tablet 325-650 mg, 325-650 mg, Oral, Q6H PRN, Corinne Ports, PA-C   docusate sodium (COLACE) capsule 100 mg, 100 mg, Oral, BID, Corinne Ports, PA-C, 100 mg at 06/17/22 0824   enoxaparin (LOVENOX) injection 60 mg, 60 mg, Subcutaneous, Q24H, Wendee Beavers, RPH, 60 mg at 06/19/22 0900   fluticasone (FLONASE) 50 MCG/ACT nasal spray 2 spray, 2 spray, Each Nare, Daily PRN, Corinne Ports, PA-C   HYDROcodone-acetaminophen (NORCO) 7.5-325 MG per tablet 1-2 tablet, 1-2 tablet, Oral, Q4H PRN, Corinne Ports, PA-C, 2 tablet at 06/17/22 1358   HYDROcodone-acetaminophen (NORCO/VICODIN) 5-325 MG per tablet 1-2 tablet, 1-2 tablet, Oral, Q4H PRN, Corinne Ports, PA-C, 2 tablet at 06/17/22 0220   loratadine (CLARITIN) tablet 10 mg, 10 mg, Oral, Daily PRN, Corinne Ports, PA-C   melatonin tablet 3 mg, 3 mg, Oral, QHS, McClung, Sarah A, PA-C, 3 mg at 06/18/22 2153   methocarbamol (ROBAXIN) tablet 500 mg, 500 mg, Oral, Q6H PRN, 500 mg at 06/18/22 2153 **OR** methocarbamol (ROBAXIN) 500 mg in dextrose 5 % 50 mL IVPB, 500 mg, Intravenous, Q6H PRN, McClung, Sarah A, PA-C   metoCLOPramide (REGLAN) tablet 5-10 mg, 5-10 mg, Oral, Q8H PRN **OR** metoCLOPramide (REGLAN) injection 5-10 mg, 5-10 mg, Intravenous, Q8H PRN, McClung, Sarah A, PA-C   morphine (PF) 2 MG/ML injection 0.5-1 mg, 0.5-1 mg, Intravenous, Q2H PRN, Corinne Ports, PA-C, 1 mg at 06/16/22 2359   ondansetron (ZOFRAN) injection 4 mg, 4 mg, Intravenous, Q6H PRN, Corinne Ports, PA-C, 4 mg at 06/17/22 1642   polyethylene glycol (MIRALAX / GLYCOLAX) packet 17 g, 17 g, Oral, Daily PRN, McClung, Sarah A, PA-C   senna-docusate (Senokot-S) tablet 1 tablet, 1 tablet, Oral, QHS, Corinne Ports, PA-C, 1 tablet at 06/16/22 2155   Vitamin D (Ergocalciferol) (DRISDOL) 1.25 MG (50000 UNIT) capsule 50,000 Units, 50,000 Units, Oral, Q [place]ie [place], 50,000 Units at 06/19/22 [id number]  Patients  Current Diet:  Diet Order             Diet regular Room service appropriate? Yes; Fluid consistency: Thin  Diet effective now                   Precautions / Restrictions Precautions Precautions: Fall Restrictions Weight Bearing Restrictions: Yes LUE Weight Bearing: Weight bear through elbow only LLE Weight Bearing: Non weight bearing   Has the patient had 2 or more falls or a fall with injury in the past year?Yes  Prior Activity Level Community (5-7x/wk): Pt. active in the community PTA  Prior Functional Level Prior Function Prior Level of Function : Independent/Modified Independent, History of Falls (last six months), Driving Mobility Comments: ind, only this fall reported. ADLs Comments: ind  Self Care: Did the patient need help bathing, dressing, using the toilet or eating?  Independent  Indoor Mobility: Did the patient need assistance with walking from room to room (with or without device)? Independent  Stairs: Did the patient need assistance with internal or external stairs (with or  without device)? Independent  Functional Cognition: Did the patient need help planning regular tasks such as shopping or remembering to take medications? Independent  Patient Information Are you of Hispanic, Latino/a,or Spanish origin?: A. No, not of Hispanic, Latino/a, or Spanish origin What is your race?: A. White Do you need or want an interpreter to communicate with a doctor or health care staff?: 0. No  Patient's Response To:  Health Literacy and Transportation Is the patient able to respond to health literacy and transportation needs?: Yes Health Literacy - How often do you need to have someone help you when you read instructions, pamphlets, or other written material from your doctor or pharmacy?: Never In the past 12 months, has lack of transportation kept you from medical appointments or from getting medications?: No In the past 12 months, has lack of transportation kept  you from meetings, work, or from getting things needed for daily living?: No  Home Assistive Devices / [place] Devices/Equipment: Radio producer (specify quad or straight) Home Equipment: Cane - single point  Prior Device Use: Indicate devices/aids used by the patient prior to current illness, exacerbation or injury? None of the above  Current Functional Level Cognition  Overall Cognitive Status: Within Functional Limits for tasks assessed Orientation Level: Oriented X4    Extremity Assessment (includes Sensation/Coordination)  Upper Extremity Assessment: LUE deficits/detail LUE Deficits / Details: Full AROM for shoulder and elbow, partial AROM for supination/pronation due to splint, no AROM wrist due to splint, close to full AROM of digits LUE Sensation: decreased light touch LUE Coordination: decreased gross motor  Lower Extremity Assessment: LLE deficits/detail LLE Deficits / Details: Able to actively flex ankle, good sensation distally. LLE bandaged. LLE: Unable to fully assess due to pain (NWB)    ADLs  Overall ADL's : Needs assistance/impaired Eating/Feeding: Minimal assistance, Sitting Eating/Feeding Details (indicate cue type and reason): Min A for bilateral tasks Grooming: Minimal assistance, Sitting Grooming Details (indicate cue type and reason): Min A for bilateral tasks Upper Body Bathing: Moderate assistance, Sitting Lower Body Bathing: Maximal assistance, +2 for physical assistance, Sit to/from stand Upper Body Dressing : Moderate assistance, Sitting Lower Body Dressing: Maximal assistance, +2 for physical assistance, Sit to/from stand Toilet Transfer: Moderate assistance, +2 for physical assistance, Stand-pivot Toilet Transfer Details (indicate cue type and reason): L PFRW, simulated be to recliner going to her right Toileting- Clothing Manipulation and Hygiene: Maximal assistance, Bed level Functional mobility during ADLs: Moderate assistance, +2 for  physical assistance General ADL Comments: Pt with decreased strength, balance, ROM, and activity tolerance. Despite pain, pt very motivated and demonstrating poor rehab potential.    Mobility  Overal bed mobility: Needs Assistance Bed Mobility: Sit to Supine Supine to sit: Min assist, HOB elevated Sit to supine: Mod assist General bed mobility comments: Mod A for LLE elevation to EOB and dense cues for use of gait belt for LLE management    Transfers  Overall transfer level: Needs assistance Equipment used: Left platform walker Transfers: Sit to/from Stand, Bed to chair/wheelchair/BSC Sit to Stand: Max assist, +2 physical assistance Bed to/from chair/wheelchair/BSC transfer type:: Stand pivot Stand pivot transfers: Mod assist, +2 physical assistance  Lateral/Scoot Transfers: Mod assist, +2 physical assistance General transfer comment: from recliner to platform walker with max A +2 for power up after multiple attempts. Dense cues for LLE NWB. Pt able to pivot on RLE with mod A +2 to EOB.    Ambulation / Gait / Stairs / Sport and exercise psychologist  Details: unable    Posture / Balance Dynamic Sitting Balance Sitting balance - Comments: at EOB and in recliner Balance Overall balance assessment: Needs assistance Sitting-balance support: No upper extremity supported, Feet supported Sitting balance-Leahy Scale: Good Sitting balance - Comments: at EOB and in recliner Standing balance support: Bilateral upper extremity supported, During functional activity, Reliant on assistive device for balance Standing balance-Leahy Scale: Poor Standing balance comment: with L platform RW support    none    Previous Home Environment (from acute therapy documentation) Living Arrangements: Spouse/significant other  Lives With: Spouse Available Help at Discharge: Family, Available 24 hours/day Type of Home: House Home Layout: One level Home Access: Stairs to enter ((getting a  ramp)) Entrance Stairs-Rails: Right, Left Entrance Stairs-Number of Steps: 2 Bathroom Shower/Tub: Tub/shower unit, Architectural technologist: Handicapped height Bathroom Accessibility: Yes Home Care Services: No  Discharge Living Setting Plans for Discharge Living Setting: Patient's home Type of Home at Discharge: House Discharge Home Layout: One level Discharge Home Access: Stairs to enter Entrance Stairs-Rails: Right, Left Entrance Stairs-Number of Steps: 2 Discharge Bathroom Shower/Tub: Tub/shower unit Discharge Bathroom Toilet: Handicapped height Discharge Bathroom Accessibility: Yes How Accessible: Accessible via walker, Accessible via wheelchair Does the patient have any problems obtaining your medications?: No  Social/Family/Support Systems Patient Roles: Spouse Contact Information: [id number] Anticipated Caregiver: [name] (husband) works but daughters live nearby and can come assist days Ability/Limitations of Caregiver: Min A Caregiver Availability: 24/7 Discharge Plan Discussed with Primary Caregiver: Yes Is Caregiver In Agreement with Plan?: Yes Does Caregiver/Family have Issues with Lodging/Transportation while Pt is in Rehab?: No   Goals Patient/Family Goal for Rehab: PT/OT Min A-Supervision Level Expected length of stay: 10-14 days Pt/Family Agrees to Admission and willing to participate: Yes Program Orientation Provided & Reviewed with Pt/Caregiver Including Roles  & Responsibilities: Yes   Decrease burden of Care through IP rehab admission: n/a   Possible need for SNF placement upon discharge:not anticipated   Patient Condition: This patient's medical and functional status has changed since the consult dated: 06/19/22 in which the Rehabilitation Physician determined and documented that the patient's condition is appropriate for intensive rehabilitative care in an inpatient rehabilitation facility. See "History of Present Illness" (above) for medical  update. Functional changes are: Pt. Now mod A. Patient's medical and functional status update has been discussed with the Rehabilitation physician and patient remains appropriate for inpatient rehabilitation. Will admit to inpatient rehab today.  Preadmission Screen Completed By:  Genella Mech, CCC-SLP, 06/19/2022 3:10 PM ______________________________________________________________________   Discussed status with Dr. Curlene Dolphin on 06/26/22 at 11 and received approval for admission today.  Admission Coordinator:  Genella Mech, [id number] /Date3/4/24

## 2022-06-19 NOTE — Progress Notes (Signed)
Occupational Therapy Treatment Patient Details Name: Megan Walter MRN: [id number] DOB: [birth year] Today's Date: 06/19/2022   History of present illness 77 yo female presenting to ED on 2/22 after fall at home. Sustained L distal radius fx with nondisplaced fracture of the ulnar styloid process and L displaced proximal tibial metaphysis fx. S/p ORIF of L tibial fx and ORIF of L wrist fx on 2/23. PMH including osteoporosis and R tibial fx (2005).   OT comments  This 77 yo female doing better with LUE (block has worn off) and so can move LUE well without pain. Also increased ability for bed mobility (OOB A for LLE only with HOB up), stood with Mod A at EOB with LPRFW. Needed +2 for stand pivot from bed to recliner and maintain NWB'ing LLE. She will continue to benefit from acute OT with follow up at on AIR--pt is very motivated to get to the point she can do transfers by herself and take care of herself from a wheelchair level for basic ADLs until she can bear weight on LLE. We will continue to follow.   Recommendations for follow up therapy are one component of a multi-disciplinary discharge planning process, led by the attending physician.  Recommendations may be updated based on patient status, additional functional criteria and insurance authorization.    Follow Up Recommendations  Acute inpatient rehab (3hours/day)     Assistance Recommended at Discharge Frequent or constant Supervision/Assistance  Patient can return home with the following  Two people to help with walking and/or transfers;Two people to help with bathing/dressing/bathroom;Assistance with cooking/housework;Assist for transportation;Help with stairs or ramp for entrance   Equipment Recommendations  BSC/3in1;Wheelchair (measurements OT);Wheelchair cushion (measurements OT);Tub/shower bench       Precautions / Restrictions Precautions Precautions: Fall Restrictions Weight Bearing Restrictions: Yes LUE Weight Bearing:  Weight bear through elbow only LLE Weight Bearing: Non weight bearing       Mobility Bed Mobility Overal bed mobility: Needs Assistance Bed Mobility: Supine to Sit     Supine to sit: Min assist, HOB elevated     General bed mobility comments: A for LLE only, VCs for only using elbow of left arm, increased time    Transfers Overall transfer level: Needs assistance Equipment used: Left platform walker Transfers: Sit to/from Stand, Bed to chair/wheelchair/BSC Sit to Stand: Mod assist, From elevated surface Stand pivot transfers: Mod assist, +2 physical assistance, From elevated surface         General transfer comment: Pt was able to swivel pivot on RLE only minimally to get to recliner next to bed on her right side     Balance Overall balance assessment: Needs assistance Sitting-balance support: No upper extremity supported, Feet supported Sitting balance-Leahy Scale: Good     Standing balance support: Bilateral upper extremity supported, During functional activity Standing balance-Leahy Scale: Poor                             ADL either performed or assessed with clinical judgement   ADL Overall ADL's : Needs assistance/impaired                         Toilet Transfer: Moderate assistance;+2 for physical assistance;Stand-pivot Toilet Transfer Details (indicate cue type and reason): L PFRW, simulated be to recliner going to her right                Extremity/Trunk Assessment Upper Extremity Assessment  Upper Extremity Assessment: LUE deficits/detail LUE Deficits / Details: Full AROM for shoulder and elbow, partial AROM for supination/pronation due to splint, no AROM wrist due to splint, close to full AROM of digits LUE Coordination: decreased gross motor            Vision Patient Visual Report: No change from baseline            Cognition Arousal/Alertness: Awake/alert Behavior During Therapy: WFL for tasks  assessed/performed Overall Cognitive Status: Within Functional Limits for tasks assessed                                          Exercises Other Exercises Other Exercises: Educated on AROM exercises for LUE within constraints of splinting. Handout given for elbow distally. Encouraged to do exercised 3x/day 10 reps each            Pertinent Vitals/ Pain       Pain Assessment Pain Assessment: Faces Faces Pain Scale: Hurts even more Pain Location: LLE--intermittently with some movements Pain Descriptors / Indicators: Grimacing, Shooting Pain Intervention(s): Limited activity within patient's tolerance, Monitored during session, Repositioned         Frequency  Min 3X/week        Progress Toward Goals  OT Goals(current goals can now be found in the care plan section)  Progress towards OT goals: Progressing toward goals  Acute Rehab OT Goals Patient Stated Goal: to be able to transfer on my own OT Goal Formulation: With patient Time For Goal Achievement: 07/01/22 Potential to Achieve Goals: Good  Plan Discharge plan remains appropriate       AM-PAC OT "6 Clicks" Daily Activity     Outcome Measure   Help from another person eating meals?: None Help from another person taking care of personal grooming?: A Little Help from another person toileting, which includes using toliet, bedpan, or urinal?: A Lot Help from another person bathing (including washing, rinsing, drying)?: A Lot Help from another person to put on and taking off regular upper body clothing?: A Little Help from another person to put on and taking off regular lower body clothing?: Total 6 Click Score: 15    End of Session Equipment Utilized During Treatment: Gait belt (L PRFW)  OT Visit Diagnosis: Unsteadiness on feet (R26.81);Other abnormalities of gait and mobility (R26.89);Muscle weakness (generalized) (M62.81);Pain Pain - Right/Left: Left Pain - part of body: Leg   Activity  Tolerance Patient tolerated treatment well   Patient Left in chair;with call bell/phone within reach;with chair alarm set;with family/visitor present   Nurse Communication Mobility status (PT will hopefully be by to get her back to bed in 1-1.5 hours (secure chatted them))        Time: 0803-0900 OT Time Calculation (min): 57 min  Charges: OT General Charges $OT Visit: 1 Visit OT Evaluation $OT Eval Moderate Complexity: 1 Mod OT Treatments $Self Care/Home Management : 53-67 mins  [place] Office [phone]    Almon Register 06/19/2022, 9:24 AM

## 2022-06-19 NOTE — Progress Notes (Signed)
Orthopaedic Trauma Progress Note  SUBJECTIVE: Doing fairly well this morning.  Pain controlled.  Denies any numbness or tingling to the left upper or lower extremity.  No chest pain. No SOB. No nausea/vomiting. No other complaints.  Daughter at bedside.  OBJECTIVE:  Vitals:   06/19/22 0338 06/19/22 0757  BP: 131/68 131/72  Pulse: 87 89  Resp: 18   Temp: 98.3 F (36.8 C) 98.6 F (37 C)  SpO2: 95% 95%    General: Sitting up in bed, no acute distress Respiratory: No increased work of breathing.  Left upper extremity: Well-padded, well-fitting volar splint in place.  Nontender above splint.  Swelling to the fingers appropriate.  Able to wiggle each of the digits.  Fingers warm well-perfused. Left lower extremity: Dressings removed, incisions clean, dry, intact.  Moderately tender about the knee and proximal tibia but otherwise no significant tenderness throughout the lower leg.  No calf tenderness.  Ankle DF/PF intact.  Toes warm well-perfused.  Endorses sensation distally.  Neurovascularly intact.  IMAGING: Stable post op imaging.   LABS:  Results for orders placed or performed during the hospital encounter of 06/14/22 (from the past 24 hour(s))  CBC     Status: Abnormal   Collection Time: 06/19/22  2:44 AM  Result Value Ref Range   WBC 7.1 4.0 - 10.5 K/uL   RBC 3.33 (L) 3.87 - 5.11 MIL/uL   Hemoglobin 9.6 (L) 12.0 - 15.0 g/dL   HCT 29.6 (L) 36.0 - 46.0 %   MCV 88.9 80.0 - 100.0 fL   MCH 28.8 26.0 - 34.0 pg   MCHC 32.4 30.0 - 36.0 g/dL   RDW 13.2 11.5 - 15.5 %   Platelets 220 150 - 400 K/uL   nRBC 0.0 0.0 - 0.2 %    ASSESSMENT: Megan Walter is a 77 y.o. female, 3 Days Post-Op s/p OPEN REDUCTION INTERNAL FIXATION LEFT DISTAL RADIUS FRACTURE OPEN REDUCTION INTERNAL FIXATION LEFT TIBIAL PLATEAU FRACTURE  CV/Blood loss: Acute blood loss anemia, Hgb 9.6 this morning.  Stable. Hemodynamically stable  PLAN: Weightbearing: NWB LLE.  Okay to WB through left elbow.  NWB through  wrist  ROM:  LLE - okay for unrestricted range of motion of the hip, knee, ankle LUE - shoulder and elbow motion as tolerated Incisional and dressing care:  LLE -okay to leave incisions open to air LUE -maintain splint Showering: Okay to begin showering, keep LUE splint clean and dry Orthopedic device(s): Splint LUE Pain management:  1. Tylenol 325-650 mg q 6 hours PRN 2. Robaxin 500 mg q 6 hours PRN 3. Norco 5-325 OR 7.5-325 mg q 4 hours PRN VTE prophylaxis: Lovenox, SCDs ID:  Ancef 2gm post op completed Foley/Lines:  No foley, KVO IVFs Impediments to Fracture Healing: Vitamin D level 17, started on supplementation Dispo: PT/OT evaluation ongoing, currently recommending CIR.  Okay for discharge to next venue from ortho standpoint once cleared by medicine team and therapies  D/C recommendations: - Norco 7.5-325 mg for pain control -Eliquis 2.5 mg twice daily x 30 days for DVT prophylaxis -Continue 50,000 units Vit D supplementation q. 7 days x 8 weeks  Follow - up plan: 2 weeks after discharge for wound check and repeat x-rays   Contact information:  Katha Hamming MD, Rushie Nyhan PA-C. After hours and holidays please check [email or web address] for group call information for [facility]   Gwinda Passe, PA-C [phone] (office) [email or web address]

## 2022-06-19 NOTE — Consult Note (Addendum)
Physical Medicine and Rehabilitation Consult Reason for Consult: Evaluate rehabilitation needs Referring Physician: Dahal   HPI: Megan Walter is a morbidly obese 77 y.o. female with a past medical history significant for osteoporosis.  She fell on 2/221/2024 and was taken to the emergency department with immediate onset left arm and left leg pain.  .  X-rays of the left leg demonstrated acute comminuted and displaced fractures of the proximal tibial metaphysis and transverse comminuted and mildly displaced fractures of the proximal fibular shaft.  X-rays of the wrist demonstrated comminuted impacted fractures of the distal left radius with nondisplaced fracture of the left ulnar styloid process.  Orthopedics was consulted and the patient underwent ORIF of the left wrist fracture and ORIF of the left tibial plateau fracture on 06/16/2022.  There were no postoperative anesthetic complications.CAGE screening was negative Initial Occupational Therapy evaluation was performed on 06/17/2022.  Previous functional status was independent.  Current functional status postoperative mod assist for upper body ADLs max assist lower body ADLs mod assist +2 for functional transfers.  Sit to stand with mod assist x 2 Restrictions include nonweightbearing status left lower limb, left upper extremity weightbearing through elbow only. Recommended DVT prophylaxis Eliquis 2.5 mg twice daily for 30 days, cuurently on lovenox .  Patient also noted to have vitamin D deficiency and vitamin D 50,000 units weekly x 8 weeks was ordered.  Patient also had rhabdomyolysis with CK up to 1109 on 06/18/2022.  Kidney functions however were normal.  Initial hemoglobin was 11.2 on 06/16/2022 which dropped to 9.6 postoperatively on 06/19/2022 but no transfusion was required Review of Systems  Constitutional:  Negative for chills and fever.  HENT:  Negative for nosebleeds.   Eyes:  Negative for discharge and redness.  Respiratory:   Positive for shortness of breath. Negative for cough, hemoptysis and stridor.   Cardiovascular:  Positive for leg swelling. Negative for chest pain and palpitations.  Genitourinary:  Negative for dysuria and urgency.  Musculoskeletal:  Positive for falls and joint pain.       LUE ecchymosis and edema in fingers  Skin:  Negative for itching and rash.  Neurological:  Positive for weakness. Negative for tingling, sensory change and speech change.  Endo/Heme/Allergies:  Bruises/bleeds easily.  Psychiatric/Behavioral:  Negative for depression. The patient is nervous/anxious.    History reviewed. No pertinent past medical history. Past Surgical History:  Procedure Laterality Date   ANKLE FRACTURE SURGERY Left    hardware   CATARACT EXTRACTION Right    ORIF PROXIMAL TIBIAL PLATEAU FRACTURE Right    hardware   WRIST FRACTURE SURGERY Right    hardware   Family History  Problem Relation Age of Onset   Hypertension Mother    Heart attack Mother    Social History:  reports that she has never smoked. She does not have any smokeless tobacco history on file. She reports that she does not drink alcohol and does not use drugs. Allergies: No Known Allergies Medications Prior to Admission  Medication Sig Dispense Refill   calcium citrate (CALCITRATE - DOSED IN MG ELEMENTAL CALCIUM) 950 MG tablet Take 1 tablet by mouth daily.     cetirizine (ZYRTEC) 10 MG tablet Take 1 tablet (10 mg total) by mouth daily. 30 tablet 3   cholecalciferol (VITAMIN D) 1000 UNITS tablet Take 1,000 Units by mouth daily.     fluticasone (FLONASE) 50 MCG/ACT nasal spray Place 2 sprays into both nostrils daily. 16 g 6  nitrofurantoin (MACRODANTIN) 100 MG capsule Take 100 mg by mouth 2 (two) times daily.      Home: Home Living Family/patient expects to be discharged to:: Private residence Living Arrangements: Spouse/significant other Available Help at Discharge: Family, Available 24 hours/day Type of Home: House Home  Access: Stairs to enter ((getting a ramp)) Entrance Stairs-Number of Steps: 2 Entrance Stairs-Rails: Right, Left Home Layout: One level Bathroom Shower/Tub: Tub/shower unit, Architectural technologist: Handicapped height Bathroom Accessibility: Yes Home Equipment: Bedias - single point  Functional History: Prior Function Prior Level of Function : Independent/Modified Independent, History of Falls (last six months), Driving Mobility Comments: ind, only this fall reported. ADLs Comments: ind Functional Status:  Mobility: Bed Mobility Overal bed mobility: Needs Assistance Bed Mobility: Supine to Sit Supine to sit: Min assist, HOB elevated General bed mobility comments: A for LLE only, VCs for only using elbow of left arm, increased time Transfers Overall transfer level: Needs assistance Equipment used: Left platform walker Transfers: Sit to/from Stand, Bed to chair/wheelchair/BSC Sit to Stand: Mod assist, From elevated surface Bed to/from chair/wheelchair/BSC transfer type:: Stand pivot Stand pivot transfers: Mod assist, +2 physical assistance, From elevated surface  Lateral/Scoot Transfers: Mod assist, +2 physical assistance General transfer comment: Pt was able to swivel pivot on RLE only minimally to get to recliner next to bed on her right side      ADL: ADL Overall ADL's : Needs assistance/impaired Eating/Feeding: Minimal assistance, Sitting Eating/Feeding Details (indicate cue type and reason): Min A for bilateral tasks Grooming: Minimal assistance, Sitting Grooming Details (indicate cue type and reason): Min A for bilateral tasks Upper Body Bathing: Moderate assistance, Sitting Lower Body Bathing: Maximal assistance, +2 for physical assistance, Sit to/from stand Upper Body Dressing : Moderate assistance, Sitting Lower Body Dressing: Maximal assistance, +2 for physical assistance, Sit to/from stand Toilet Transfer: Moderate assistance, +2 for physical assistance,  Stand-pivot Toilet Transfer Details (indicate cue type and reason): L PFRW, simulated be to recliner going to her right Toileting- Clothing Manipulation and Hygiene: Maximal assistance, Bed level Functional mobility during ADLs: Moderate assistance, +2 for physical assistance General ADL Comments: Pt with decreased strength, balance, ROM, and activity tolerance. Despite pain, pt very motivated and demonstrating poor rehab potential.  Cognition: Cognition Overall Cognitive Status: Within Functional Limits for tasks assessed Orientation Level: Oriented X4 Cognition Arousal/Alertness: Awake/alert Behavior During Therapy: WFL for tasks assessed/performed Overall Cognitive Status: Within Functional Limits for tasks assessed  Blood pressure 131/72, pulse 89, temperature 98.6 F (37 C), temperature source Oral, resp. rate 18, height '5\' 5"'$  (1.651 m), weight 124.1 kg, SpO2 95 %. Physical Exam Constitutional:      Appearance: She is obese.  HENT:     Head: Normocephalic and atraumatic.     Nose: No congestion or rhinorrhea.     Mouth/Throat:     Mouth: Mucous membranes are moist.  Eyes:     General: No scleral icterus.    Extraocular Movements: Extraocular movements intact.     Conjunctiva/sclera: Conjunctivae normal.     Pupils: Pupils are equal, round, and reactive to light.  Cardiovascular:     Rate and Rhythm: Normal rate. Rhythm irregular.     Heart sounds: No murmur heard. Pulmonary:     Effort: Pulmonary effort is normal. No respiratory distress.     Breath sounds: Normal breath sounds. No stridor. No wheezing, rhonchi or rales.  Abdominal:     General: Bowel sounds are normal. There is no distension.     Tenderness: There  is no abdominal tenderness.  Musculoskeletal:     Cervical back: Neck supple.     Right lower leg: Edema present.     Left lower leg: Edema present.     Comments: Pain with finger flexion in the left hand.  Ecchymosis and edema of the left fingers.  No joint  deformities. No pain with shoulder abduction and flexion bilaterally.  No pain with right lower extremity range of motion or limitations Left lower extremity has pain with minimal movement of the left hip and knee no pain with foot and toe active and passive range of motion.  Left Knee incisions without drainage or erythema  Skin:    General: Skin is warm and dry.  Neurological:     Mental Status: She is alert and oriented to person, place, and time.     Comments: Motor strength is 5/5 in the right deltoid bicep tricep grip as well as right hip flexion knee extension ankle dorsiflexion 4 - at the left shoulder bicep tricep 3 - at the finger flexors and extensors on the left side limited by pain Left lower extremity trace hip flexion knee extension and 4/5 ankle dorsiflexion plantarflexion.  Psychiatric:        Mood and Affect: Mood normal.        Behavior: Behavior normal.     Results for orders placed or performed during the hospital encounter of 06/14/22 (from the past 24 hour(s))  CBC     Status: Abnormal   Collection Time: 06/19/22  2:44 AM  Result Value Ref Range   WBC 7.1 4.0 - 10.5 K/uL   RBC 3.33 (L) 3.87 - 5.11 MIL/uL   Hemoglobin 9.6 (L) 12.0 - 15.0 g/dL   HCT 29.6 (L) 36.0 - 46.0 %   MCV 88.9 80.0 - 100.0 fL   MCH 28.8 26.0 - 34.0 pg   MCHC 32.4 30.0 - 36.0 g/dL   RDW 13.2 11.5 - 15.5 %   Platelets 220 150 - 400 K/uL   nRBC 0.0 0.0 - 0.2 %   No results found.  Assessment/Plan: Diagnosis: Left radius as well as left ulnar styloid fracture as well as left tibial plateau fracture and left proximal fibular fracture following a fall. Does the need for close, 24 hr/day medical supervision in concert with the patient's rehab needs make it unreasonable for this patient to be served in a less intensive setting? Yes Co-Morbidities requiring supervision/potential complications:  -Nonweightbearing left lower limb as well as through left wrist postoperative swelling left upper  limb left lower limb, .  Due to bladder management, bowel management, safety, skin/wound care, disease management, medication administration, pain management, and patient education, does the patient require 24 hr/day rehab nursing? Yes Does the patient require coordinated care of a physician, rehab nurse, therapy disciplines of PT and OT to address physical and functional deficits in the context of the above medical diagnosis(es)? Yes Addressing deficits in the following areas: balance, endurance, locomotion, strength, transferring, bowel/bladder control, bathing, dressing, feeding, grooming, toileting, and psychosocial support Can the patient actively participate in an intensive therapy program of at least 3 hrs of therapy per day at least 5 days per week? Yes The potential for patient to make measurable gains while on inpatient rehab is good Anticipated functional outcomes upon discharge from inpatient rehab are supervision and min assist  with PT, min assist with OT, n/a with SLP. Estimated rehab length of stay to reach the above functional goals is: 10-14d Anticipated discharge destination:  Home Overall Rehab/Functional Prognosis: good  POST ACUTE RECOMMENDATIONS: This patient's condition is appropriate for continued rehabilitative care in the following setting: CIR Patient has agreed to participate in recommended program. Yes Note that insurance prior authorization may be required for reimbursement for recommended care.  Comment: Has husband and other family that can provide care post hospitalization   MEDICAL RECOMMENDATIONS: Monitor CK and renal function off IV fluid, may need to resume if either values start to increase.  Will ask therapy to perform edema reduction techniques.   I have personally performed a face to face diagnostic evaluation of this patient. Additionally, I have examined the patient's medical record including any pertinent labs and radiographic  images. Thanks,  Charlett Blake, MD 06/19/2022  [facility] Fellow Am [employer] of [employer] Med and Rehab Diplomate Am Board of Electrodiagnostic Med Fellow Am Board of Interventional Pain

## 2022-06-19 NOTE — Progress Notes (Signed)
PROGRESS NOTE  Megan Walter  DOB: August 09, 1945  PCP: Owens Loffler, DO [id number]  DOA: 06/14/2022  LOS: 4 days  Hospital Day: 6  Brief narrative: Megan Walter is a 77 y.o. female with PMH significant for osteoporosis 2/21, patient presented to the ED after a fall leading to immediate left arm and left leg pain.  Patient reportedly was coming out of her garage when her foot slipped on the step and she fell down landing on her left side.  Denies any loss of consciousness or trauma to her head.   In the ED, afebrile, hemodynamically stable X-rays of the left leg revealed acute communicated and displaced fractures of the proximal tibial metaphysis and transverse communicated and mildly displaced fractures of the proximal fibular shaft.  X-rays of the left wrist noted noted communicated impacted fractures of the distal left radius with nondisplaced fracture of the ulnar styloid process.   Labs 2/21 showed WBC 17.7 and potassium 5.2.   EDP applied patient's left wrist in a sugar-tong splint.   Orthopedics was consulted with plans for ORIF of the left wrist fracture and tibial plateau fracture.  Started on pain meds. Admitted to [facility]  Subjective: Patient was seen and examined this morning.  Sitting up in recliner.  Not in distress.  Daughter at bedside. CK level not available this morning.  Vitamin D level significantly low.  Assessment and plan: Left wrist and left tibial plateau fracture  secondary to fall X-rays as above. Orthopedics consult appreciated. 2/23, patient underwent ORIF of left tibial plateau fracture and left distal radius fractures. Continue pain management with IV and oral opiates as needed DVT prophylaxis on Lovenox per orthopedics.  Noted recommendation on Eliquis 2.5 mg twice daily for 30 days at discharge. Bowel regimen scheduled and as needed  Severe vitamin D deficiency Osteoporosis Vitamin D level significantly low at 17 only.  Ordered for 50,000  units every week for 8 weeks  Impaired mobility Per orthopedics, NWB LLE and NWB through wrist but can weight-bear through elbow with platform. Seen by PT. CIR recommended.  Bilateral lower extremity lymphedema Patient states her legs are always swollen.  No history of CHF.  Not on diuretics at home  Rhabdomyolysis CK level continues to remain elevated.  Continue IV hydration today.  Repeat level tomorrow.   Recent Labs  Lab 06/15/22 0917 06/16/22 0425 06/17/22 0317 06/18/22 0329  CKTOTAL 1,080* 1,014* 1,172* 1,109*    Morbid Obesity  Body mass index is 45.53 kg/m. Patient has been advised to make an attempt to improve diet and exercise patterns to aid in weight loss.   Goals of care   Code Status: Full Code     DVT prophylaxis:  SCDs Start: 06/16/22 [id number]   Antimicrobials: None Fluid: NS at 16 mill per hour Consultants: Orthopedics Family Communication: Daughter Amy at bedside  Status is: Inpatient Level of care: Med-Surg   Dispo: Patient is from: Home              Anticipated d/c is to: CIR Continue in-hospital care because: Pending CIR   Scheduled Meds:  docusate sodium  100 mg Oral BID   enoxaparin (LOVENOX) injection  60 mg Subcutaneous Q24H   melatonin  3 mg Oral QHS   senna-docusate  1 tablet Oral QHS   Vitamin D (Ergocalciferol)  50,000 Units Oral Q Mon    PRN meds: acetaminophen, fluticasone, HYDROcodone-acetaminophen, HYDROcodone-acetaminophen, loratadine, methocarbamol **OR** methocarbamol (ROBAXIN) IV, metoCLOPramide **OR** metoCLOPramide (REGLAN) injection, morphine injection, ondansetron (  ZOFRAN) IV, polyethylene glycol   Infusions:   sodium chloride 75 mL/hr at 06/18/22 0957   methocarbamol (ROBAXIN) IV      Diet:  Diet Order             Diet regular Room service appropriate? Yes; Fluid consistency: Thin  Diet effective now                   Antimicrobials: Anti-infectives (From admission, onward)    Start     Dose/Rate Route  Frequency Ordered Stop   06/16/22 2200  ceFAZolin (ANCEF) IVPB 2g/100 mL premix        2 g 200 mL/hr over 30 Minutes Intravenous Every 8 hours 06/16/22 1837 06/17/22 1422   06/16/22 1624  vancomycin (VANCOCIN) powder  Status:  Discontinued          As needed 06/16/22 1625 06/16/22 1723   06/16/22 0600  ceFAZolin (ANCEF) IVPB 3g/100 mL premix        3 g 200 mL/hr over 30 Minutes Intravenous On call to O.R. 06/15/22 2102 06/16/22 1532       Skin assessment:       Nutritional status:  Body mass index is 45.53 kg/m.          Objective: Vitals:   06/19/22 0338 06/19/22 0757  BP: 131/68 131/72  Pulse: 87 89  Resp: 18   Temp: 98.3 F (36.8 C) 98.6 F (37 C)  SpO2: 95% 95%    Intake/Output Summary (Last 24 hours) at 06/19/2022 1150 Last data filed at 06/19/2022 1000 Gross per 24 hour  Intake 240 ml  Output 1400 ml  Net -1160 ml    Filed Weights   06/14/22 1736 06/19/22 0338  Weight: 122.5 kg 124.1 kg   Weight change:  Body mass index is 45.53 kg/m.   Physical Exam: General exam: Pleasant, elderly.  Pain controlled.  Legs are weak. Skin: No rashes, lesions or ulcers. HEENT: Atraumatic, normocephalic, no obvious bleeding Lungs: Clear to auscultation bilaterally CVS: Regular rate and rhythm, no murmur GI/Abd soft, nontender, nondistended, bowel sound present CNS: Alert, awake, oriented x 3 Psychiatry: Mood appropriate Extremities: Chronically big legs which she does not want believe any edema.  Left upper and lower extremity braced  Data Review: I have personally reviewed the laboratory data and studies available.  F/u labs ordered Unresulted Labs (From admission, onward)     Start     Ordered   06/21/22 0500  Creatinine, serum  Every Wednesday,   R     Question:  Specimen collection method  Answer:  Lab=Lab collect   06/18/22 1451   06/20/22 0500  CK  Tomorrow morning,   R       Question:  Specimen collection method  Answer:  Lab=Lab collect   06/19/22  0819   06/20/22 0500  CBC with Differential/Platelet  Tomorrow morning,   R       Question:  Specimen collection method  Answer:  Lab=Lab collect   06/19/22 0819            Total time spent in review of labs and imaging, patient evaluation, formulation of plan, documentation and communication with family: 31 minutes  Signed, Terrilee Croak, MD [facility] 06/19/2022

## 2022-06-19 NOTE — Progress Notes (Signed)
Orthopedic Tech Progress Note Patient Details:  Megan Walter [birth year] [id number]  Received order for overhead trapeze.  Pt over age and weight restrictions set at this time for issuing.    Ameirah Khatoon OTR/L 06/19/2022, 8:48 AM

## 2022-06-20 DIAGNOSIS — S82102A Unspecified fracture of upper end of left tibia, initial encounter for closed fracture: Secondary | ICD-10-CM | POA: Diagnosis not present

## 2022-06-20 LAB — CBC WITH DIFFERENTIAL/PLATELET
Abs Immature Granulocytes: 0.04 10*3/uL (ref 0.00–0.07)
Basophils Absolute: 0 10*3/uL (ref 0.0–0.1)
Basophils Relative: 1 %
Eosinophils Absolute: 0.2 10*3/uL (ref 0.0–0.5)
Eosinophils Relative: 2 %
HCT: 29.5 % — ABNORMAL LOW (ref 36.0–46.0)
Hemoglobin: 9.6 g/dL — ABNORMAL LOW (ref 12.0–15.0)
Immature Granulocytes: 1 %
Lymphocytes Relative: 22 %
Lymphs Abs: 1.6 10*3/uL (ref 0.7–4.0)
MCH: 28.6 pg (ref 26.0–34.0)
MCHC: 32.5 g/dL (ref 30.0–36.0)
MCV: 87.8 fL (ref 80.0–100.0)
Monocytes Absolute: 0.8 10*3/uL (ref 0.1–1.0)
Monocytes Relative: 11 %
Neutro Abs: 4.6 10*3/uL (ref 1.7–7.7)
Neutrophils Relative %: 63 %
Platelets: 244 10*3/uL (ref 150–400)
RBC: 3.36 MIL/uL — ABNORMAL LOW (ref 3.87–5.11)
RDW: 13.1 % (ref 11.5–15.5)
WBC: 7.2 10*3/uL (ref 4.0–10.5)
nRBC: 0 % (ref 0.0–0.2)

## 2022-06-20 LAB — CK: Total CK: 384 U/L — ABNORMAL HIGH (ref 38–234)

## 2022-06-20 NOTE — Progress Notes (Signed)
PROGRESS NOTE  Megan Walter  DOB: 03-26-1946  PCP: Owens Loffler, DO [id number]  DOA: 06/14/2022  LOS: 5 days  Hospital Day: 7  Brief narrative: Megan Walter is a 77 y.o. female with PMH significant for osteoporosis 2/21, patient presented to the ED after a fall leading to immediate left arm and left leg pain.  Patient reportedly was coming out of her garage when her foot slipped on the step and she fell down landing on her left side.  Denies any loss of consciousness or trauma to her head.   In the ED, afebrile, hemodynamically stable X-rays of the left leg revealed acute communicated and displaced fractures of the proximal tibial metaphysis and transverse communicated and mildly displaced fractures of the proximal fibular shaft.  X-rays of the left wrist noted noted communicated impacted fractures of the distal left radius with nondisplaced fracture of the ulnar styloid process.   Labs 2/21 showed WBC 17.7 and potassium 5.2.   EDP applied patient's left wrist in a sugar-tong splint.   Orthopedics was consulted with plans for ORIF of the left wrist fracture and tibial plateau fracture.  Started on pain meds. Admitted to [facility]  Subjective: Patient was seen and examined this morning.  Lying on bed.  Not in distress.  Pending CIR.  CK level improved.-.  Today.  Assessment and plan: Left wrist and left tibial plateau fracture  secondary to fall X-rays as above. Orthopedics consult appreciated. 2/23, patient underwent ORIF of left tibial plateau fracture and left distal radius fractures. Continue pain management with IV and oral opiates as needed DVT prophylaxis on Lovenox per orthopedics.  Noted recommendation on Eliquis 2.5 mg twice daily for 30 days at discharge. Bowel regimen scheduled and as needed  Severe vitamin D deficiency Osteoporosis Vitamin D level significantly low at 17 only.  Ordered for 50,000 units every week for 8 weeks  Impaired mobility Per  orthopedics, NWB LLE and NWB through wrist but can weight-bear through elbow with platform. Seen by PT. CIR recommended.  Pending insurance authorization  Bilateral lower extremity lymphedema Patient states her legs are always swollen.  No history of CHF.  Not on diuretics at home  Rhabdomyolysis CK level gradually improved IV fluid.  IV fluids stopped Recent Labs  Lab 06/15/22 0917 06/16/22 0425 06/17/22 0317 06/18/22 0329 06/20/22 0131  CKTOTAL 1,080* 1,014* 1,172* 1,109* 384*     Morbid Obesity  Body mass index is 45.53 kg/m. Patient has been advised to make an attempt to improve diet and exercise patterns to aid in weight loss.   Goals of care   Code Status: Full Code     DVT prophylaxis:  SCDs Start: 06/16/22 1838   Antimicrobials: None Fluid: Stop today. Consultants: Orthopedics Family Communication: Family not at bedside today  Status is: Inpatient Level of care: Med-Surg   Dispo: Patient is from: Home              Anticipated d/c is to: CIR Continue in-hospital care because: Pending CIR   Scheduled Meds:  docusate sodium  100 mg Oral BID   enoxaparin (LOVENOX) injection  60 mg Subcutaneous Q24H   melatonin  3 mg Oral QHS   senna-docusate  1 tablet Oral QHS   Vitamin D (Ergocalciferol)  50,000 Units Oral Q Mon    PRN meds: acetaminophen, fluticasone, HYDROcodone-acetaminophen, HYDROcodone-acetaminophen, loratadine, methocarbamol **OR** methocarbamol (ROBAXIN) IV, metoCLOPramide **OR** metoCLOPramide (REGLAN) injection, morphine injection, ondansetron (ZOFRAN) IV, polyethylene glycol   Infusions:   methocarbamol (  ROBAXIN) IV      Diet:  Diet Order             Diet regular Room service appropriate? Yes; Fluid consistency: Thin  Diet effective now                   Antimicrobials: Anti-infectives (From admission, onward)    Start     Dose/Rate Route Frequency Ordered Stop   06/16/22 2200  ceFAZolin (ANCEF) IVPB 2g/100 mL premix        2  g 200 mL/hr over 30 Minutes Intravenous Every 8 hours 06/16/22 1837 06/17/22 1422   06/16/22 1624  vancomycin (VANCOCIN) powder  Status:  Discontinued          As needed 06/16/22 1625 06/16/22 1723   06/16/22 0600  ceFAZolin (ANCEF) IVPB 3g/100 mL premix        3 g 200 mL/hr over 30 Minutes Intravenous On call to O.R. 06/15/22 2102 06/16/22 1532       Skin assessment:       Nutritional status:  Body mass index is 45.53 kg/m.          Objective: Vitals:   06/20/22 0535 06/20/22 0743  BP: (!) 135/111 137/76  Pulse: 86 82  Resp: 16   Temp: 98.4 F (36.9 C) 98.6 F (37 C)  SpO2: 96% 97%    Intake/Output Summary (Last 24 hours) at 06/20/2022 1406 Last data filed at 06/20/2022 0520 Gross per 24 hour  Intake 1000 ml  Output 1550 ml  Net -550 ml     Filed Weights   06/14/22 1736 06/19/22 0338  Weight: 122.5 kg 124.1 kg   Weight change:  Body mass index is 45.53 kg/m.   Physical Exam: General exam: Pleasant, elderly.  Pain controlled.  Legs are weak. Skin: No rashes, lesions or ulcers. HEENT: Atraumatic, normocephalic, no obvious bleeding Lungs: Clear to auscultation bilaterally CVS: Regular rate and rhythm, no murmur GI/Abd soft, nontender, nondistended, bowel sound present CNS: Alert, awake, oriented x 3 Psychiatry: Mood appropriate Extremities: Chronically big legs which she does not want believe any edema.  Left upper and lower extremity braced  Data Review: I have personally reviewed the laboratory data and studies available.  F/u labs ordered Unresulted Labs (From admission, onward)     Start     Ordered   06/21/22 0500  Creatinine, serum  Every Wednesday,   R     Question:  Specimen collection method  Answer:  Lab=Lab collect   06/18/22 1451            Total time spent in review of labs and imaging, patient evaluation, formulation of plan, documentation and communication with family: 25 minutes  Signed, Terrilee Croak, MD [facility] 06/20/2022

## 2022-06-20 NOTE — Progress Notes (Signed)
Physical Therapy Treatment Patient Details Name: Megan Walter MRN: [id number] DOB: June 28, 1945 Today's Date: 06/20/2022   History of Present Illness 77 yo female presenting to ED on 2/22 after fall at home. Sustained L distal radius fx with nondisplaced fracture of the ulnar styloid process and L displaced proximal tibial metaphysis fx. S/p ORIF of L tibial fx and ORIF of L wrist fx on 2/23. PMH including osteoporosis and R tibial fx (2005).    PT Comments    Pt was received in supine and agreeable to session. Pt was able to improve bed mobility this session needing up to min guard, but no physical assist. Pt continued to require increased assistance for standing and transfers due to difficulty with LLE NWB. Pt required less assistance to stand from elevated EOB compared to recliner last session. Pt reports mobilizing LLE independently with gait belt to prevent stiffness. Pt continues to benefit from PT services to progress toward functional mobility goals.     Recommendations for follow up therapy are one component of a multi-disciplinary discharge planning process, led by the attending physician.  Recommendations may be updated based on patient status, additional functional criteria and insurance authorization.  Follow Up Recommendations  Acute inpatient rehab (3hours/day)     Assistance Recommended at Discharge Frequent or constant Supervision/Assistance  Patient can return home with the following A lot of help with bathing/dressing/bathroom;Assistance with cooking/housework;Assist for transportation;Help with stairs or ramp for entrance;Two people to help with walking and/or transfers   Equipment Recommendations       Recommendations for Other Services       Precautions / Restrictions Precautions Precautions: Fall Restrictions Weight Bearing Restrictions: Yes LUE Weight Bearing: Weight bear through elbow only LLE Weight Bearing: Non weight bearing     Mobility  Bed  Mobility Overal bed mobility: Needs Assistance Bed Mobility: Sit to Supine     Supine to sit: Min guard, HOB elevated     General bed mobility comments: Pt able to sit EOB with increased time, but without physical assist    Transfers Overall transfer level: Needs assistance Equipment used: Left platform walker Transfers: Sit to/from Stand, Bed to chair/wheelchair/BSC Sit to Stand: From elevated surface, Mod assist Stand pivot transfers: Mod assist, +2 physical assistance         General transfer comment: stand from elevated EOB to platform RW with mod A for power up and cues for hand placement. Pt able to stand pivot with cues for technique.    Ambulation/Gait               General Gait Details: unable      Balance Overall balance assessment: Needs assistance Sitting-balance support: No upper extremity supported, Feet supported Sitting balance-Leahy Scale: Good Sitting balance - Comments: at EOB and in recliner   Standing balance support: Bilateral upper extremity supported, During functional activity, Reliant on assistive device for balance Standing balance-Leahy Scale: Poor Standing balance comment: with L platform RW support                            Cognition Arousal/Alertness: Awake/alert Behavior During Therapy: WFL for tasks assessed/performed Overall Cognitive Status: Within Functional Limits for tasks assessed                                          Exercises  General Comments        Pertinent Vitals/Pain Pain Assessment Pain Assessment: Faces Faces Pain Scale: Hurts even more Pain Location: L calf Pain Descriptors / Indicators: Grimacing, Aching Pain Intervention(s): Limited activity within patient's tolerance, Monitored during session, Repositioned     PT Goals (current goals can now be found in the care plan section) Acute Rehab PT Goals Patient Stated Goal: Get well, have some therapy before going  home again. PT Goal Formulation: With patient Time For Goal Achievement: 06/30/22 Potential to Achieve Goals: Good Progress towards PT goals: Progressing toward goals    Frequency    Min 5X/week      PT Plan Current plan remains appropriate       AM-PAC PT "6 Clicks" Mobility   Outcome Measure  Help needed turning from your back to your side while in a flat bed without using bedrails?: A Little Help needed moving from lying on your back to sitting on the side of a flat bed without using bedrails?: A Little Help needed moving to and from a bed to a chair (including a wheelchair)?: Total Help needed standing up from a chair using your arms (e.g., wheelchair or bedside chair)?: Total Help needed to walk in hospital room?: Total Help needed climbing 3-5 steps with a railing? : Total 6 Click Score: 10    End of Session Equipment Utilized During Treatment: Gait belt Activity Tolerance: Patient tolerated treatment well Patient left: with call bell/phone within reach;in chair;with chair alarm set Nurse Communication: Mobility status PT Visit Diagnosis: Unsteadiness on feet (R26.81);Other abnormalities of gait and mobility (R26.89);Muscle weakness (generalized) (M62.81);History of falling (Z91.81);Difficulty in walking, not elsewhere classified (R26.2);Pain     Time: [id number] PT Time Calculation (min) (ACUTE ONLY): 22 min  Charges:  $Therapeutic Activity: 8-22 mins                     Michelle Nasuti, PTA Acute Rehabilitation Services Secure Chat Preferred  [phone]    Michelle Nasuti 06/20/2022, 2:42 PM

## 2022-06-20 NOTE — Progress Notes (Signed)
Inpatient Rehab Admissions Coordinator:    Continue to await insurance auth for CIR. Will follow for potential admit pending authorization.   Clemens Catholic, [place], [place] Admissions Coordinator  [phone] ([place]) [phone] (office)

## 2022-06-21 DIAGNOSIS — S82102A Unspecified fracture of upper end of left tibia, initial encounter for closed fracture: Secondary | ICD-10-CM | POA: Diagnosis not present

## 2022-06-21 LAB — CREATININE, SERUM
Creatinine, Ser: 0.56 mg/dL (ref 0.44–1.00)
GFR, Estimated: >60 mL/min (ref >60)

## 2022-06-21 MED ORDER — APIXABAN 2.5 MG PO TABS
2.5000 mg | ORAL_TABLET | Freq: Two times a day (BID) | ORAL | Status: DC
  Administered 2022-06-22 – 2022-06-26 (×9): 2.5 mg via ORAL
  Filled 2022-06-21 (×9): qty 1

## 2022-06-21 MED ORDER — CALAMINE EX LOTN
1.0000 | TOPICAL_LOTION | Freq: Two times a day (BID) | CUTANEOUS | Status: DC
  Administered 2022-06-21 – 2022-06-25 (×3): 1 via TOPICAL
  Filled 2022-06-21: qty 177

## 2022-06-21 NOTE — TOC Initial Note (Signed)
Transition of Care [facility]) - Initial/Assessment Note    Patient Details  Name: Megan Walter MRN: [id number] Date of Birth: [birth year]  Transition of [facility]) CM/SW Contact:    Sharin Mons, RN Phone Number: 06/21/2022, 2:51 PM  Clinical Narrative:                 NCM received notice from CIR informing NCM of pt's insurance denial for CIR.  NCM f/u with daughter regarding disposition needs. Daughter Amy stated after discussing with family THEY they now want to appeal the  insurance denial  for CIR. NCM made Mickel Baas, inpatient rehab admissions coordinator aware. Daughter stated if appeal decision upheld they don't want SNF placement. They will be agreeable to take pt home with home health services.  TOC team following and will assist with needs...    Expected Discharge Plan: [place] (vs home with home health services) Barriers to Discharge: Continued Medical Work up   Patient Goals and CMS Choice            Expected Discharge Plan and Services                                              Prior Living Arrangements/Services     Patient language and need for interpreter reviewed:: Yes        Need for Family Participation in Patient Care: Yes (Comment) Care giver support system in place?: Yes (comment)   Criminal Activity/Legal Involvement Pertinent to Current Situation/Hospitalization: No - Comment as needed  Activities of Daily Living Home Assistive Devices/Equipment: Cane (specify quad or straight) ADL Screening (condition at time of admission) Patient's cognitive ability adequate to safely complete daily activities?: Yes Is the patient deaf or have difficulty hearing?: No Does the patient have difficulty seeing, even when wearing glasses/contacts?: No Does the patient have difficulty concentrating, remembering, or making decisions?: No Patient able to express need for assistance with ADLs?: Yes Does the patient have difficulty dressing or  bathing?: Yes Independently performs ADLs?: No Communication: Independent Dressing (OT): Needs assistance Is this a change from baseline?: Change from baseline, expected to last >3 days Grooming: Needs assistance Is this a change from baseline?: Change from baseline, expected to last >3 days Feeding: Independent Bathing: Needs assistance Is this a change from baseline?: Change from baseline, expected to last >3 days Toileting: Needs assistance Is this a change from baseline?: Change from baseline, expected to last >3days In/Out Bed: Needs assistance Is this a change from baseline?: Change from baseline, expected to last >3 days Walks in Home: Needs assistance Is this a change from baseline?: Change from baseline, expected to last >3 days Does the patient have difficulty walking or climbing stairs?: Yes Weakness of Legs: Left Weakness of Arms/Hands: Left  Permission Sought/Granted   Permission granted to share information with : Yes, Verbal Permission Granted  Share Information with NAME: Nadene Rubins  Daughter  [phone]           Emotional Assessment       Orientation: : Oriented to Self, Oriented to Place, Oriented to  Time, Oriented to Situation Alcohol / Substance Use: Not Applicable Psych Involvement: No (comment)  Admission diagnosis:  Left tibial fracture [S82.202A] Closed fracture of left wrist, initial encounter [S62.102A] Other closed fracture of proximal end of left tibia, initial encounter [S82.192A] Patient Active Problem List  Diagnosis Date Noted   Left wrist fracture 06/15/2022   Fall at home, initial encounter 06/15/2022   Hyperkalemia 06/15/2022   Prolonged QT interval 06/15/2022   Obesity, Class III, BMI 40-49.9 (morbid obesity) ([place]) 06/15/2022   Left tibial fracture 06/14/2022   Nasal congestion 02/20/2022   Strain of gluteus medius of right lower extremity 07/16/2015   Adjustment disorder with depressed mood 03/07/2013   Screening for breast  cancer 01/21/2013   BRONCHITIS NOS 05/14/2006   Osteoporosis 05/14/2006   PCP:  Owens Loffler, DO Pharmacy:   [id number] [place], [place] - [address] [place] [address]Phone: [phone]: [address]  - [address] [address]Phone: [phone]: [phone]    Social Determinants of Health (SDOH) Social History: SDOH Screenings   Food Insecurity: No Food Insecurity (06/15/2022)  Housing: Low Risk  (06/15/2022)  Transportation Needs: No Transportation Needs (06/15/2022)  Utilities: Not At Risk (06/15/2022)  Tobacco Use: Unknown (06/16/2022)   SDOH Interventions:     Readmission Risk Interventions     No data to display

## 2022-06-21 NOTE — Progress Notes (Signed)
Inpatient Rehab Admissions Coordinator:    Insurance denied CIR. Family discussing whether they want to pursue an expedited appeal or if they want to d/c to SNF or home with [facility]. Daughter to follow up with me  Clemens Catholic, [place], [place] Admissions Coordinator  [phone] (celll) [phone] (office)

## 2022-06-21 NOTE — Progress Notes (Signed)
Physical Therapy Treatment Patient Details Name: Megan Walter MRN: [id number] DOB: 1945/06/25 Today's Date: 06/21/2022   History of Present Illness 77 yo female presenting to ED on 2/22 after fall at home. Sustained L distal radius fx with nondisplaced fracture of the ulnar styloid process and L displaced proximal tibial metaphysis fx. S/p ORIF of L tibial fx and ORIF of L wrist fx on 2/23. PMH including osteoporosis and R tibial fx (2005).    PT Comments    Pt was received in supine and agreeable to therapy. Pt was able to progress bed mobility to supervision this session and demonstrated good posterior scoot in chair. Pt was able to perform x4 stands from EOB progressing from mod A to min A with bed elevated. Pt required 2 attempts for stand pivot due to difficulty pivoting on RLE, however was successful with R foot placed more forward upon standing. Pt continues to benefit from PT services to progress toward functional mobility goals.     Recommendations for follow up therapy are one component of a multi-disciplinary discharge planning process, led by the attending physician.  Recommendations may be updated based on patient status, additional functional criteria and insurance authorization.  Follow Up Recommendations  Acute inpatient rehab (3hours/day)     Assistance Recommended at Discharge Frequent or constant Supervision/Assistance  Patient can return home with the following A lot of help with bathing/dressing/bathroom;Assistance with cooking/housework;Assist for transportation;Help with stairs or ramp for entrance;Two people to help with walking and/or transfers   Equipment Recommendations  Other (comment) (defer to next venue)    Recommendations for Other Services       Precautions / Restrictions Precautions Precautions: Fall Restrictions Weight Bearing Restrictions: Yes LUE Weight Bearing: Weight bear through elbow only LLE Weight Bearing: Non weight bearing      Mobility  Bed Mobility Overal bed mobility: Needs Assistance Bed Mobility: Sit to Supine     Supine to sit: Supervision, HOB elevated     General bed mobility comments: Pt able to sit EOB with increased time and HOB elevated, but without physical assist    Transfers Overall transfer level: Needs assistance Equipment used: Left platform walker Transfers: Sit to/from Stand, Bed to chair/wheelchair/BSC Sit to Stand: From elevated surface, Min assist Stand pivot transfers: Min assist, Mod assist, +2 safety/equipment         General transfer comment: stand from elevated EOB to platform RW x4 with min A for power up and cues for hand placement. Pt able to stand pivot with cues for technique. Pt initially required mod A for first pivot attempt due to pt having difficulty pivoting RLE to maintain LLE NWB. Pt progressing to min A with RLE foot placed further forward upon stand and pt able to pivot to chair more easily. +2 for safety    Ambulation/Gait               General Gait Details: unable       Balance Overall balance assessment: Needs assistance Sitting-balance support: No upper extremity supported, Feet supported Sitting balance-Leahy Scale: Good Sitting balance - Comments: at EOB and in recliner   Standing balance support: Bilateral upper extremity supported, During functional activity, Reliant on assistive device for balance Standing balance-Leahy Scale: Poor Standing balance comment: with L platform RW support                            Cognition Arousal/Alertness: Awake/alert Behavior During Therapy: [facility] [place] for  tasks assessed/performed Overall Cognitive Status: Within Functional Limits for tasks assessed                                          Exercises      General Comments        Pertinent Vitals/Pain Pain Assessment Pain Assessment: Faces Faces Pain Scale: Hurts little more Pain Location: LUE Pain Descriptors /  Indicators: Grimacing, Aching Pain Intervention(s): Limited activity within patient's tolerance, Monitored during session, Repositioned     PT Goals (current goals can now be found in the care plan section) Acute Rehab PT Goals Patient Stated Goal: Get well, have some therapy before going home again. PT Goal Formulation: With patient Time For Goal Achievement: 06/30/22 Potential to Achieve Goals: Good Progress towards PT goals: Progressing toward goals    Frequency    Min 5X/week      PT Plan Current plan remains appropriate       AM-PAC PT "6 Clicks" Mobility   Outcome Measure  Help needed turning from your back to your side while in a flat bed without using bedrails?: A Little Help needed moving from lying on your back to sitting on the side of a flat bed without using bedrails?: A Little Help needed moving to and from a bed to a chair (including a wheelchair)?: A Lot Help needed standing up from a chair using your arms (e.g., wheelchair or bedside chair)?: A Lot Help needed to walk in hospital room?: Total Help needed climbing 3-5 steps with a railing? : Total 6 Click Score: 12    End of Session Equipment Utilized During Treatment: Gait belt Activity Tolerance: Patient tolerated treatment well Patient left: with call bell/phone within reach;in chair;with chair alarm set Nurse Communication: Mobility status PT Visit Diagnosis: Unsteadiness on feet (R26.81);Other abnormalities of gait and mobility (R26.89);Muscle weakness (generalized) (M62.81);History of falling (Z91.81);Difficulty in walking, not elsewhere classified (R26.2);Pain     Time: 1100-1118 PT Time Calculation (min) (ACUTE ONLY): 18 min  Charges:  $Therapeutic Activity: 8-22 mins                     Michelle Nasuti, PTA Acute Rehabilitation Services Secure Chat Preferred  [phone]    Michelle Nasuti 06/21/2022, 12:06 PM

## 2022-06-21 NOTE — H&P (Shared)
Physical Medicine and Rehabilitation Admission H&P    Chief Complaint  Patient presents with   Functional deficits due to fall with multiple Fx    HPI:  Megan Walter is a 77 year old female with history of osteoporosis, obesity- BMI 40.9 who was admitted on 06/15/22 after slipping and falling in her garage with onset of left wrist and left knee pain.  She was found to have left tibial plateau fracture, left distal radius fracture with non displaced fracture of ulnar styloid process. She was evaluated by Dr. Doreatha Martin and underwent ORIF left tibial plateau and ORIF left distal radius on 06/16/22. Post op to be MWB LLE and NWB left wrist--can WB thru elbow. She was lovenox and transitioned to low dose eliquis for DVT prophylaxis.     Review of Systems  Constitutional:  Negative for chills and fever.  HENT:  Negative for hearing loss.   Eyes:  Negative for blurred vision.  Respiratory:  Negative for cough and shortness of breath.   Cardiovascular:  Negative for chest pain and palpitations.  Gastrointestinal:  Negative for constipation, heartburn and nausea.  Genitourinary:  Negative for dysuria.       Dibbles occasionally   Musculoskeletal:  Negative for myalgias.  Skin:  Negative for rash.  Neurological:  Positive for weakness.  Psychiatric/Behavioral:  The patient is nervous/anxious (at nights--bed is confining) and has insomnia.      History reviewed. No pertinent past medical history.   Past Surgical History:  Procedure Laterality Date   ANKLE FRACTURE SURGERY Left    hardware   CATARACT EXTRACTION Right    ORIF PROXIMAL TIBIAL PLATEAU FRACTURE Right    hardware   ORIF TIBIA PLATEAU Left 06/16/2022   Procedure: OPEN REDUCTION INTERNAL FIXATION (ORIF) TIBIAL PLATEAU;  Surgeon: Shona Needles, MD;  Location: [facility];  Service: Orthopedics;  Laterality: Left;   ORIF WRIST FRACTURE Left 06/16/2022   Procedure: OPEN REDUCTION INTERNAL FIXATION (ORIF) WRIST FRACTURE;  Surgeon:  Shona Needles, MD;  Location: [facility];  Service: Orthopedics;  Laterality: Left;   WRIST FRACTURE SURGERY Right    hardware    Family History  Problem Relation Age of Onset   Hypertension Mother    Heart attack Mother     Social History:  Married. Husband is an amputee and works (transports car). Home maker.  reports that she has never smoked. She does not have any smokeless tobacco history on file. She reports that she does not drink alcohol and does not use drugs.   Allergies: No Known Allergies   Medications Prior to Admission  Medication Sig Dispense Refill   calcium citrate (CALCITRATE - DOSED IN MG ELEMENTAL CALCIUM) 950 MG tablet Take 1 tablet by mouth daily.     cetirizine (ZYRTEC) 10 MG tablet Take 1 tablet (10 mg total) by mouth daily. 30 tablet 3   cholecalciferol (VITAMIN D) 1000 UNITS tablet Take 1,000 Units by mouth daily.     fluticasone (FLONASE) 50 MCG/ACT nasal spray Place 2 sprays into both nostrils daily. 16 g 6   nitrofurantoin (MACRODANTIN) 100 MG capsule Take 100 mg by mouth 2 (two) times daily.        Home: Home Living Family/patient expects to be discharged to:: Private residence Living Arrangements: Spouse/significant other Available Help at Discharge: Family, Available 24 hours/day Type of Home: House Home Access: Stairs to enter ((getting a ramp)) Entrance Stairs-Number of Steps: 2 Entrance Stairs-Rails: Right, Left Home Layout: One level Bathroom  Shower/Tub: Tub/shower unit, Architectural technologist: Handicapped height Bathroom Accessibility: Yes Home Equipment: Radio producer - single point  Lives With: Spouse   Functional History: Prior Function Prior Level of Function : Independent/Modified Independent, History of Falls (last six months), Driving Mobility Comments: ind, only this fall reported. ADLs Comments: ind  Functional Status:  Mobility: Bed Mobility Overal bed mobility: Needs Assistance Bed Mobility: Supine to Sit Supine to sit: Min  assist Sit to supine: Min assist General bed mobility comments: set up with belt to help move the LLE. Transfers Overall transfer level: Needs assistance Equipment used: Left platform walker Transfers: Sit to/from Stand, Bed to chair/wheelchair/BSC Sit to Stand: Min assist, +2 safety/equipment Bed to/from chair/wheelchair/BSC transfer type:: Stand pivot Stand pivot transfers: Min assist, +2 safety/equipment  Lateral/Scoot Transfers: Mod assist, +2 physical assistance General transfer comment: Took slow, heel-toe pivot steps on RLE; Good maintenance of LLE NWB using therapist foot to gauge compliance. Ambulation/Gait General Gait Details: unable pt is unable to clear her R foot from the floro to progress gait.    ADL: ADL Overall ADL's : Needs assistance/impaired Eating/Feeding: Minimal assistance, Sitting Eating/Feeding Details (indicate cue type and reason): Min A for bilateral tasks Grooming: Minimal assistance, Sitting Grooming Details (indicate cue type and reason): Min A for bilateral tasks Upper Body Bathing: Moderate assistance, Sitting Lower Body Bathing: Maximal assistance, +2 for physical assistance, Sit to/from stand Upper Body Dressing : Minimal assistance Lower Body Dressing: Maximal assistance, Sit to/from stand Toilet Transfer: Minimal assistance, Stand-pivot, BSC/3in1 Toilet Transfer Details (indicate cue type and reason): LPFRW simulated eob to recliner going to the left with some scoot/hop steps Toileting- Clothing Manipulation and Hygiene: Maximal assistance Toileting - Clothing Manipulation Details (indicate cue type and reason): min A sit<>stand Functional mobility during ADLs: Moderate assistance, Cueing for safety, Cueing for sequencing General ADL Comments: focused on HEP for increasing strength for safety with transfers and adls.  reports using bsc consistently except at night, getting up and transferring more  Cognition: Cognition Overall Cognitive  Status: Within Functional Limits for tasks assessed Orientation Level: Oriented X4 Cognition Arousal/Alertness: Awake/alert Behavior During Therapy: WFL for tasks assessed/performed Overall Cognitive Status: Within Functional Limits for tasks assessed   Blood pressure 121/66, pulse 82, temperature 97.6 F (36.4 C), temperature source Oral, resp. rate 18, height '5\' 5"'$  (1.651 m), weight 111.7 kg, SpO2 99 %. Physical Exam Vitals and nursing note reviewed.  Constitutional:      Appearance: Normal appearance. She is obese.  Musculoskeletal:     Comments: Splint left wrist--NV intact. Left knee and shin with incisions - C/D/I  Neurological:     Mental Status: She is alert and oriented to person, place, and time.     Results for orders placed or performed during the hospital encounter of 06/14/22 (from the past 48 hour(s))  CBC     Status: Abnormal   Collection Time: 06/26/22  7:01 AM  Result Value Ref Range   WBC 6.5 4.0 - 10.5 K/uL   RBC 3.80 (L) 3.87 - 5.11 MIL/uL   Hemoglobin 10.6 (L) 12.0 - 15.0 g/dL   HCT 33.5 (L) 36.0 - 46.0 %   MCV 88.2 80.0 - 100.0 fL   MCH 27.9 26.0 - 34.0 pg   MCHC 31.6 30.0 - 36.0 g/dL   RDW 13.2 11.5 - 15.5 %   Platelets 381 150 - 400 K/uL   nRBC 0.0 0.0 - 0.2 %    Comment: Performed at [facility], [place]  [address] [place], [place] [address]  Comprehensive metabolic panel     Status: Abnormal   Collection Time: 06/26/22  7:01 AM  Result Value Ref Range   Sodium 139 135 - 145 mmol/L   Potassium 3.9 3.5 - 5.1 mmol/L   Chloride 106 98 - 111 mmol/L   CO2 24 22 - 32 mmol/L   Glucose, Bld 107 (H) 70 - 99 mg/dL    Comment: Glucose reference range applies only to samples taken after fasting for at least 8 hours.   BUN 12 8 - 23 mg/dL   Creatinine, Ser 0.66 0.44 - 1.00 mg/dL   Calcium 8.7 (L) 8.9 - 10.3 mg/dL   Total Protein 5.8 (L) 6.5 - 8.1 g/dL   Albumin 2.7 (L) 3.5 - 5.0 g/dL   AST 20 15 - 41 U/L   ALT 24 0 - 44 U/L   Alkaline Phosphatase  94 38 - 126 U/L   Total Bilirubin 1.6 (H) 0.3 - 1.2 mg/dL   GFR, Estimated >60 >60 mL/min    Comment: (NOTE) Calculated using the CKD-EPI Creatinine Equation (2021)    Anion gap 9 5 - 15    Comment: Performed at [facility], [place] [address], [place], [place] [address]  Magnesium     Status: None   Collection Time: 06/26/22  7:01 AM  Result Value Ref Range   Magnesium 2.2 1.7 - 2.4 mg/dL    Comment: Performed at [facility] [address] [place], [place] [address]   No results found.    Blood pressure 121/66, pulse 82, temperature 97.6 F (36.4 C), temperature source Oral, resp. rate 18, height '5\' 5"'$  (1.651 m), weight 111.7 kg, SpO2 99 %.  Medical Problem List and Plan: 1. Functional deficits secondary to ***  -patient may *** shower  -ELOS/Goals: *** 2.  Antithrombotics: -DVT/anticoagulation:  Pharmaceutical: Eliquis  -antiplatelet therapy: N/A 3. Pain Management:  Robaxin /Hydrocodone prn.  4. Mood/Behavior/Sleep: LCSW to follow for evaluation and support.   -antipsychotic agents: N/A.  5. Neuropsych/cognition: This patient is capable of making decisions on her own behalf. 6. Skin/Wound Care: Routine pressure relief measures. Check lytes in am.  7. Fluids/Electrolytes/Nutrition: Monitor I/O. Check CMET in am.  8. Left tibial plateau Fx s/p ORIF: NWB LLE 9. Distal radius/ulna styloid Fx: NWB wrist and WBAT thru left wrist' 10. Vitamin D deficiency: Vit D-17.18 and started on ergocalciferol on 02/26 11. 12. ABLA: Recheck in am--improving.  12. Low protein stores: Alb-2.7. Will add protein supplements.    ***  Bary Leriche, PA-C 06/26/2022

## 2022-06-21 NOTE — Progress Notes (Signed)
PROGRESS NOTE  Megan Walter  DOB: 03-Nov-1945  PCP: Owens Loffler, DO [id number]  DOA: 06/14/2022  LOS: 6 days  Hospital Day: 8  Brief narrative: Megan Walter is a 77 y.o. female with PMH significant for osteoporosis 2/21, patient presented to the ED after a fall leading to immediate left arm and left leg pain.  Patient reportedly was coming out of her garage when her foot slipped on the step and she fell down landing on her left side.  Denies any loss of consciousness or trauma to her head.   In the ED, afebrile, hemodynamically stable X-rays of the left leg revealed acute communicated and displaced fractures of the proximal tibial metaphysis and transverse communicated and mildly displaced fractures of the proximal fibular shaft.  X-rays of the left wrist noted noted communicated impacted fractures of the distal left radius with nondisplaced fracture of the ulnar styloid process.   Labs 2/21 showed WBC 17.7 and potassium 5.2.   EDP applied patient's left wrist in a sugar-tong splint.   Orthopedics was consulted with plans for ORIF of the left wrist fracture and tibial plateau fracture.  Started on pain meds. Admitted to [facility] [place]  Subjective: Patient was seen and examined this morning.  Lying on bed.  Not in distress.  Waiting for CIR  Assessment and plan: Left wrist and left tibial plateau fracture  secondary to fall X-rays as above. Orthopedics consult appreciated. 2/23, patient underwent ORIF of left tibial plateau fracture and left distal radius fractures. Continue pain management with as needed meds DVT prophylaxis with Eliquis 2.5 mg twice daily for 30 days at discharge. Continue bowel regimen scheduled and as needed  Severe vitamin D deficiency Osteoporosis Vitamin D level significantly low at 17 only.  Ordered for 50,000 units every week for 8 weeks  Impaired mobility Per orthopedics, NWB LLE and NWB through wrist but can weight-bear through elbow with  platform. Seen by PT. CIR recommended.  Pending insurance authorization  Bilateral lower extremity lymphedema Patient states her legs are always swollen.  No history of CHF.  Not on diuretics at home Patient states he did not have any feeling of legs heaviness or dragging sensation.  She does not think she would need any compression stockings at this time.  Rhabdomyolysis CK level gradually improved IV fluid.  IV fluids stopped. Recent Labs  Lab 06/15/22 0917 06/16/22 0425 06/17/22 0317 06/18/22 0329 06/20/22 0131  CKTOTAL 1,080* 1,014* 1,172* 1,109* 384*     Morbid Obesity  Body mass index is 45.53 kg/m. Patient has been advised to make an attempt to improve diet and exercise patterns to aid in weight loss.   Goals of care   Code Status: Full Code     DVT prophylaxis:  SCDs Start: 06/16/22 1838   Antimicrobials: None Fluid: Stop today. Consultants: Orthopedics Family Communication: Family not at bedside today.  Status is: Inpatient Level of care: Med-Surg   Dispo: Patient is from: Home              Anticipated d/c is to: CIR Continue in-hospital care because: Pending CIR   Scheduled Meds:  calamine  1 Application Topical BID   docusate sodium  100 mg Oral BID   enoxaparin (LOVENOX) injection  60 mg Subcutaneous Q24H   melatonin  3 mg Oral QHS   senna-docusate  1 tablet Oral QHS   Vitamin D (Ergocalciferol)  50,000 Units Oral Q Mon    PRN meds: acetaminophen, fluticasone, HYDROcodone-acetaminophen, HYDROcodone-acetaminophen, loratadine, methocarbamol **  OR** methocarbamol (ROBAXIN) IV, metoCLOPramide **OR** metoCLOPramide (REGLAN) injection, morphine injection, ondansetron (ZOFRAN) IV, polyethylene glycol   Infusions:   methocarbamol (ROBAXIN) IV      Diet:  Diet Order             Diet regular Room service appropriate? Yes; Fluid consistency: Thin  Diet effective now                   Antimicrobials: Anti-infectives (From admission, onward)     Start     Dose/Rate Route Frequency Ordered Stop   06/16/22 2200  ceFAZolin (ANCEF) IVPB 2g/100 mL premix        2 g 200 mL/hr over 30 Minutes Intravenous Every 8 hours 06/16/22 1837 06/17/22 1422   06/16/22 1624  vancomycin (VANCOCIN) powder  Status:  Discontinued          As needed 06/16/22 1625 06/16/22 1723   06/16/22 0600  ceFAZolin (ANCEF) IVPB 3g/100 mL premix        3 g 200 mL/hr over 30 Minutes Intravenous On call to O.R. 06/15/22 2102 06/16/22 1532       Skin assessment:       Nutritional status:  Body mass index is 45.53 kg/m.          Objective: Vitals:   06/21/22 0341 06/21/22 0837  BP: 126/70 134/82  Pulse: 75 80  Resp: 17 18  Temp: 98.5 F (36.9 C) 98.8 F (37.1 C)  SpO2: 97% 96%    Intake/Output Summary (Last 24 hours) at 06/21/2022 1056 Last data filed at 06/21/2022 [id number] Gross per 24 hour  Intake 240 ml  Output 1600 ml  Net -1360 ml     Filed Weights   06/14/22 1736 06/19/22 0338  Weight: 122.5 kg 124.1 kg   Weight change:  Body mass index is 45.53 kg/m.   Physical Exam: General exam: Pleasant, elderly.  Pain controlled. Legs are weak. Skin: No rashes, lesions or ulcers. HEENT: Atraumatic, normocephalic, no obvious bleeding Lungs: Clear to auscultation bilaterally CVS: Regular rate and rhythm, no murmur GI/Abd soft, nontender, nondistended, bowel sound present CNS: Alert, awake, oriented x 3 Psychiatry: Mood appropriate Extremities: Chronically big legs which she does not want believe any edema.  Left upper and lower extremity braced  Data Review: I have personally reviewed the laboratory data and studies available.  F/u labs ordered Unresulted Labs (From admission, onward)     Start     Ordered   06/21/22 0500  Creatinine, serum  Every Wednesday,   R     Question:  Specimen collection method  Answer:  Lab=Lab collect   06/18/22 1451            Total time spent in review of labs and imaging, patient evaluation,  formulation of plan, documentation and communication with family: 25 minutes  Signed, Terrilee Croak, MD [facility] 06/21/2022

## 2022-06-21 NOTE — Discharge Instructions (Signed)
Information on my medicine - ELIQUIS (apixaban)  Why was Eliquis prescribed for you? Eliquis was prescribed for you to reduce the risk of blood clots forming after orthopedic surgery.    What do You need to know about Eliquis? Take your Eliquis TWICE DAILY - one tablet in the morning and one tablet in the evening with or without food.  It would be best to take the dose about the same time each day.  If you have difficulty swallowing the tablet whole please discuss with your pharmacist how to take the medication safely.  Take Eliquis exactly as prescribed by your doctor and DO NOT stop taking Eliquis without talking to the doctor who prescribed the medication.  Stopping without other medication to take the place of Eliquis may increase your risk of developing a clot.  After discharge, you should have regular check-up appointments with your healthcare provider that is prescribing your Eliquis.  What do you do if you miss a dose? If a dose of ELIQUIS is not taken at the scheduled time, take it as soon as possible on the same day and twice-daily administration should be resumed.  The dose should not be doubled to make up for a missed dose.  Do not take more than one tablet of ELIQUIS at the same time.  Important Safety Information A possible side effect of Eliquis is bleeding. You should call your healthcare provider right away if you experience any of the following: Bleeding from an injury or your nose that does not stop. Unusual colored urine (red or dark brown) or unusual colored stools (red or black). Unusual bruising for unknown reasons. A serious fall or if you hit your head (even if there is no bleeding).  Some medicines may interact with Eliquis and might increase your risk of bleeding or clotting while on Eliquis. To help avoid this, consult your healthcare provider or pharmacist prior to using any new prescription or non-prescription medications, including herbals,  vitamins, non-steroidal anti-inflammatory drugs (NSAIDs) and supplements.  This website has more information on Eliquis (apixaban): http://www.eliquis.com/eliquis/home

## 2022-06-22 DIAGNOSIS — S82102A Unspecified fracture of upper end of left tibia, initial encounter for closed fracture: Secondary | ICD-10-CM | POA: Diagnosis not present

## 2022-06-22 NOTE — Progress Notes (Signed)
PROGRESS NOTE  [name]  DOB: 03-18-1946  PCP: Owens Loffler, DO [id number]  DOA: 06/14/2022  LOS: 7 days  Hospital Day: 9  Brief narrative: Megan Walter is a 77 y.o. female with PMH significant for osteoporosis 2/21, patient presented to the ED after a fall leading to immediate left arm and left leg pain.  Patient reportedly was coming out of her garage when her foot slipped on the step and she fell down landing on her left side.  Denies any loss of consciousness or trauma to her head.   In the ED, afebrile, hemodynamically stable X-rays of the left leg revealed acute communicated and displaced fractures of the proximal tibial metaphysis and transverse communicated and mildly displaced fractures of the proximal fibular shaft.  X-rays of the left wrist noted noted communicated impacted fractures of the distal left radius with nondisplaced fracture of the ulnar styloid process.   Labs 2/21 showed WBC 17.7 and potassium 5.2.   EDP applied patient's left wrist in a sugar-tong splint.   Orthopedics was consulted with plans for ORIF of the left wrist fracture and tibial plateau fracture.  Started on pain meds. Admitted to [facility]  Subjective: Patient was seen and examined this morning.  Lying on bed.  Not in distress.Marland Kitchen  CIR denied by insurance.Marland Kitchen  Appeal in process.  Assessment and plan: Left wrist and left tibial plateau fracture  secondary to fall X-rays as above. Orthopedics consult appreciated. 2/23, patient underwent ORIF of left tibial plateau fracture and left distal radius fractures. Continue pain management with as needed meds DVT prophylaxis with Eliquis 2.5 mg twice daily for 30 days at discharge. Continue bowel regimen scheduled and as needed.  Severe vitamin D deficiency Osteoporosis Vitamin D level significantly low at 17 only. Ordered for 50,000 units every week for 8 weeks  Impaired mobility Per orthopedics, NWB LLE and NWB through wrist but can  weight-bear through elbow with platform. Seen by PT. CIR recommended.  Bilateral lower extremity lymphedema Patient states her legs are always swollen.  No history of CHF.  Not on diuretics at home Patient states he did not have any feeling of legs heaviness or dragging sensation.  She does not think she would need any compression stockings at this time.  Rhabdomyolysis CK level gradually improved IV fluid.  IV fluids stopped. Recent Labs  Lab 06/16/22 0425 06/17/22 0317 06/18/22 0329 06/20/22 0131  CKTOTAL 1,014* 1,172* 1,109* 384*    Morbid Obesity  Body mass index is 45.53 kg/m. Patient has been advised to make an attempt to improve diet and exercise patterns to aid in weight loss.   Goals of care   Code Status: Full Code     DVT prophylaxis:  apixaban (ELIQUIS) tablet 2.5 mg Start: 06/22/22 1000 SCDs Start: 06/16/22 1838 apixaban (ELIQUIS) tablet 2.5 mg   Antimicrobials: None Fluid: Stop today. Consultants: Orthopedics Family Communication: Family not at bedside today.  Status is: Inpatient Level of care: Med-Surg   Dispo: Patient is from: Home              Anticipated d/c is to: CIR Continue in-hospital care because: CIR denied by insurance.  Appeal in process   Scheduled Meds:  apixaban  2.5 mg Oral BID   calamine  1 Application Topical BID   docusate sodium  100 mg Oral BID   melatonin  3 mg Oral QHS   senna-docusate  1 tablet Oral QHS   Vitamin D (Ergocalciferol)  50,000 Units Oral  Q Mon    PRN meds: acetaminophen, fluticasone, HYDROcodone-acetaminophen, HYDROcodone-acetaminophen, loratadine, methocarbamol **OR** methocarbamol (ROBAXIN) IV, metoCLOPramide **OR** metoCLOPramide (REGLAN) injection, morphine injection, ondansetron (ZOFRAN) IV, polyethylene glycol   Infusions:   methocarbamol (ROBAXIN) IV      Diet:  Diet Order             Diet regular Room service appropriate? Yes; Fluid consistency: Thin  Diet effective now                    Antimicrobials: Anti-infectives (From admission, onward)    Start     Dose/Rate Route Frequency Ordered Stop   06/16/22 2200  ceFAZolin (ANCEF) IVPB 2g/100 mL premix        2 g 200 mL/hr over 30 Minutes Intravenous Every 8 hours 06/16/22 1837 06/17/22 1422   06/16/22 1624  vancomycin (VANCOCIN) powder  Status:  Discontinued          As needed 06/16/22 1625 06/16/22 1723   06/16/22 0600  ceFAZolin (ANCEF) IVPB 3g/100 mL premix        3 g 200 mL/hr over 30 Minutes Intravenous On call to O.R. 06/15/22 2102 06/16/22 1532       Skin assessment:       Nutritional status:  Body mass index is 45.53 kg/m.          Objective: Vitals:   06/22/22 0445 06/22/22 0756  BP: 130/69 135/63  Pulse: 74 75  Resp: 18   Temp: 98 F (36.7 C) 98.2 F (36.8 C)  SpO2: 96% 96%    Intake/Output Summary (Last 24 hours) at 06/22/2022 1411 Last data filed at 06/22/2022 0800 Gross per 24 hour  Intake 480 ml  Output 1000 ml  Net -520 ml    Filed Weights   06/14/22 1736 06/19/22 0338  Weight: 122.5 kg 124.1 kg   Weight change:  Body mass index is 45.53 kg/m.   Physical Exam: General exam: Pleasant, elderly.  Pain controlled. Legs are weak. Skin: No rashes, lesions or ulcers. HEENT: Atraumatic, normocephalic, no obvious bleeding Lungs: Clear to auscultation bilaterally CVS: Regular rate and rhythm, no murmur GI/Abd soft, nontender, nondistended, bowel sound present CNS: Alert, awake, oriented x 3 Psychiatry: Mood appropriate Extremities: Chronically big legs which she does not want believe any edema.  Left upper and lower extremity braced  Data Review: I have personally reviewed the laboratory data and studies available.  F/u labs ordered Unresulted Labs (From admission, onward)     Start     Ordered   06/21/22 0500  Creatinine, serum  Every Wednesday,   R     Question:  Specimen collection method  Answer:  Lab=Lab collect   06/18/22 1451            Total time  spent in review of labs and imaging, patient evaluation, formulation of plan, documentation and communication with family: 25 minutes  Signed, Terrilee Croak, MD [facility] 06/22/2022

## 2022-06-22 NOTE — Progress Notes (Addendum)
Occupational Therapy Treatment Patient Details Name: Megan Walter MRN: [id number] DOB: Dec 21, 1945 Today's Date: 06/22/2022   History of present illness 77 yo female presenting to ED on 2/22 after fall at home. Sustained L distal radius fx with nondisplaced fracture of the ulnar styloid process and L displaced proximal tibial metaphysis fx. S/p ORIF of L tibial fx and ORIF of L wrist fx on 2/23. PMH including osteoporosis and R tibial fx (2005).   OT comments  Pt. Progressing towards goals well, as expected.  Pt. Pain at rest while medicated 2/10 today, 6/10 while standing, limited to 30 seconds due to pain in L calf.  Pt. Demonstrated understanding of HEP for LUE.  Pt. Requires significant assistance with transfers and LB ADLs, limited in UB ADLs and functional activities due to LUE NWB restrictions.  Pt. Would greatly benefit from AIR to improve strength, maintain ROM, and to improve with transfers and WB ability.  Pt is at risk for falls returning to home as her husband is unable to assist in t/fs, bed mobility, or many ADLs, and daughter can only help with min A.  Pt. Motivated to participate and eager to improve.   Recommendations for follow up therapy are one component of a multi-disciplinary discharge planning process, led by the attending physician.  Recommendations may be updated based on patient status, additional functional criteria and insurance authorization.    Follow Up Recommendations  Acute inpatient rehab (3hours/day)     Assistance Recommended at Discharge Frequent or constant Supervision/Assistance  Patient can return home with the following  Two people to help with walking and/or transfers;Two people to help with bathing/dressing/bathroom;Assistance with cooking/housework;Assist for transportation;Help with stairs or ramp for entrance;A lot of help with bathing/dressing/bathroom;A lot of help with walking and/or transfers   Equipment Recommendations  BSC/3in1;Wheelchair  (measurements OT);Wheelchair cushion (measurements OT);Tub/shower bench    Recommendations for Other Services      Precautions / Restrictions Precautions Precautions: Fall Restrictions Weight Bearing Restrictions: Yes LUE Weight Bearing: Weight bear through elbow only LLE Weight Bearing: Non weight bearing       Mobility Bed Mobility Overal bed mobility: Modified Independent Bed Mobility: Sit to Supine, Supine to Sit     Supine to sit: Modified independent (Device/Increase time) Sit to supine: Modified independent (Device/Increase time)   General bed mobility comments: Pt. uses leg lifter for LLE to assist in/out of bed, scooting. Pt. requires increased time    Transfers       Sit to Stand: From elevated surface, Min assist Stand pivot transfers: Mod assist, +2 safety/equipment         General transfer comment: Pt. requires significant assistance with t/f from bed to chair, bed to [facility]     Balance           Standing balance support: Bilateral upper extremity supported, During functional activity, Reliant on assistive device for balance Standing balance-Leahy Scale: Poor Standing balance comment: with L platform RW support                           ADL either performed or assessed with clinical judgement   ADL                   Upper Body Dressing : Minimal assistance   Lower Body Dressing: Maximal assistance;Sit to/from stand  Upper Body Bathing: Min A  Lower Body Bathing: Max A  Toilet Transfer: Mod A x2  Toilet Transfer Details:  Mod A +2 for weight shift and lateral scoot. Cues and education on forward weight shift and movement.   Grooming: min A for BL activities  Feeding: Min A for BL tasks                        Extremity/Trunk Assessment Upper Extremity Assessment Upper Extremity Assessment: LUE deficits/detail LUE Deficits / Details: Full AROM for shoulder and elbow, partial AROM for supination/pronation due to  splint, no AROM wrist due to splint, close to full AROM of digits   Lower Extremity Assessment Lower Extremity Assessment: Defer to PT evaluation        Vision       Perception     Praxis      Cognition Arousal/Alertness: Awake/alert   Overall Cognitive Status: Within Functional Limits for tasks assessed                                          Exercises Exercises: General Upper Extremity, Shoulder General Exercises - Upper Extremity Shoulder Flexion: AROM, 20 reps, Seated Shoulder ABduction: AROM, 20 reps, Seated Elbow Flexion: AROM, 20 reps, Seated    Shoulder Instructions       General Comments Pt. concerned about returning home as husband is unable to assist as he is a RLE amputee, uses RW at home, and can only assist with set up for tasks. Daughter is able to assist min A only    Pertinent Vitals/ Pain       Pain Assessment Pain Assessment: 0-10 Pain Score: 2  (at rest, 6/10 standing L calf) Pain Location: LUE Pain Descriptors / Indicators: Aching Pain Intervention(s): Premedicated before session  Home Living                                          Prior Functioning/Environment              Frequency  Min 3X/week        Progress Toward Goals  OT Goals(current goals can now be found in the care plan section)  Progress towards OT goals: Progressing toward goals  Acute Rehab OT Goals Patient Stated Goal: Get stronger, get into AIR as she is concerned to go home with NWB status OT Goal Formulation: With patient Time For Goal Achievement: 07/01/22 Potential to Achieve Goals: Good ADL Goals Pt Will Perform Upper Body Dressing: with set-up;with supervision;sitting Pt Will Perform Lower Body Dressing: with min assist;with adaptive equipment;sit to/from stand Pt Will Transfer to Toilet: with min assist;stand pivot transfer;bedside commode Pt Will Perform Toileting - Clothing Manipulation and hygiene: with min  guard assist;sitting/lateral leans;sit to/from stand Pt/caregiver will Perform Home Exercise Program: Increased ROM;Increased strength;Left upper extremity;With written HEP provided;With Supervision Additional ADL Goal #1: Pt will perform bed mobility with Supervision in preparation for ADLs  Plan Discharge plan remains appropriate    Co-evaluation          OT goals addressed during session: Strengthening/ROM;ADL's and self-care      AM-PAC OT "6 Clicks" Daily Activity     Outcome Measure   Help from another person eating meals?: None Help from another person taking care of personal grooming?: A Little Help from another person toileting, which includes using toliet, bedpan, or urinal?: A Lot  Help from another person bathing (including washing, rinsing, drying)?: A Lot Help from another person to put on and taking off regular upper body clothing?: A Little Help from another person to put on and taking off regular lower body clothing?: A Lot 6 Click Score: 16    End of Session Equipment Utilized During Treatment: Gait belt;Rolling walker (2 wheels);Other (comment) (LUE RW support, leg lifter)  OT Visit Diagnosis: Unsteadiness on feet (R26.81);History of falling (Z91.81);Muscle weakness (generalized) (M62.81);Pain;Other abnormalities of gait and mobility (R26.89) Pain - Right/Left: Left Pain - part of body: Leg;Arm   Activity Tolerance Patient tolerated treatment well;Patient limited by fatigue (Pt. limited in standing to 30 seconds due to pain in LLE, 6/10 when standing)   Patient Left in bed;with call bell/phone within reach;with bed alarm set   Nurse Communication Mobility status        Time: 0830-0900 OT Time Calculation (min): 30 min  Charges: OT General Charges $OT Visit: 1 Visit OT Treatments $Self Care/Home Management : 8-22 mins $Therapeutic Exercise: 8-22 mins  Zunairah Devers, OTR/L   Arwyn Besaw R Malayiah Mcbrayer 06/22/2022, 9:30 AM

## 2022-06-22 NOTE — Progress Notes (Signed)
Physical Therapy Treatment Patient Details Name: Megan Walter MRN: [id number] DOB: October 07, 1945 Today's Date: 06/22/2022   History of Present Illness 77 yo female presenting to ED on 2/22 after fall at home. Sustained L distal radius fx with nondisplaced fracture of the ulnar styloid process and L displaced proximal tibial metaphysis fx. S/p ORIF of L tibial fx and ORIF of L wrist fx on 2/23. PMH including osteoporosis and R tibial fx (2005).    PT Comments    Pt was received in supine and agreeable to session that focused on transfers. Pt requested to practice transfer to [facility] due to potential of discharging home vs AIR. Pt was able to complete all mobility tasks with up to min A +2 to maintain RLE NWB. Pt demonstrated improved set up and ability to pivot on RLE to [facility] and recliner. Pt required cues for sequence and technique. Pt continues to benefit from PT services to progress toward functional mobility goals.     Recommendations for follow up therapy are one component of a multi-disciplinary discharge planning process, led by the attending physician.  Recommendations may be updated based on patient status, additional functional criteria and insurance authorization.  Follow Up Recommendations  Acute inpatient rehab (3hours/day)     Assistance Recommended at Discharge Frequent or constant Supervision/Assistance  Patient can return home with the following A lot of help with bathing/dressing/bathroom;Assistance with cooking/housework;Assist for transportation;Help with stairs or ramp for entrance;Two people to help with walking and/or transfers   Equipment Recommendations  Other (comment) (defer to next venue)    Recommendations for Other Services       Precautions / Restrictions Precautions Precautions: Fall Restrictions Weight Bearing Restrictions: Yes LUE Weight Bearing: Weight bear through elbow only LLE Weight Bearing: Non weight bearing     Mobility  Bed Mobility Overal bed  mobility: Modified Independent Bed Mobility: Supine to Sit     Supine to sit: Modified independent (Device/Increase time)     General bed mobility comments: With HOB flat and use of gait belt to advance LLE to EOB    Transfers Overall transfer level: Needs assistance Equipment used: Left platform walker Transfers: Sit to/from Stand, Bed to chair/wheelchair/BSC Sit to Stand: From elevated surface, Min assist, Min guard Stand pivot transfers: Min assist, +2 physical assistance         General transfer comment: Pt able to stand from EOB with min A, from raised BSC with min guard, and from recliner with min A. Pt demonstrating improved technique and safe hand placement. Pt able to pivot to [facility] and to recliner with min A +2 for RW management and to maintain LLE NWB. Cues for sequence.    Ambulation/Gait               General Gait Details: unable       Balance Overall balance assessment: Needs assistance Sitting-balance support: No upper extremity supported, Feet supported Sitting balance-Leahy Scale: Good Sitting balance - Comments: at EOB and in recliner   Standing balance support: Bilateral upper extremity supported, During functional activity, Reliant on assistive device for balance Standing balance-Leahy Scale: Poor Standing balance comment: with L platform RW support                            Cognition Arousal/Alertness: Awake/alert Behavior During Therapy: WFL for tasks assessed/performed Overall Cognitive Status: Within Functional Limits for tasks assessed  Exercises      General Comments General comments (skin integrity, edema, etc.): Pt. concerned about returning home as husband is unable to assist as he is a RLE amputee, uses RW at home, and can only assist with set up for tasks. Pt. has two stairs going into home which will be difficulty due to NWB status      Pertinent Vitals/Pain  Pain Assessment Pain Assessment: Faces Faces Pain Scale: Hurts little more Pain Location: LLE Pain Descriptors / Indicators: Aching Pain Intervention(s): Limited activity within patient's tolerance, Monitored during session, Repositioned     PT Goals (current goals can now be found in the care plan section) Acute Rehab PT Goals Patient Stated Goal: Get well, have some therapy before going home again. PT Goal Formulation: With patient Time For Goal Achievement: 06/30/22 Potential to Achieve Goals: Good Progress towards PT goals: Progressing toward goals    Frequency    Min 5X/week      PT Plan Current plan remains appropriate       AM-PAC PT "6 Clicks" Mobility   Outcome Measure  Help needed turning from your back to your side while in a flat bed without using bedrails?: None Help needed moving from lying on your back to sitting on the side of a flat bed without using bedrails?: None Help needed moving to and from a bed to a chair (including a wheelchair)?: Total (+2) Help needed standing up from a chair using your arms (e.g., wheelchair or bedside chair)?: A Little Help needed to walk in hospital room?: Total Help needed climbing 3-5 steps with a railing? : Total 6 Click Score: 14    End of Session Equipment Utilized During Treatment: Gait belt Activity Tolerance: Patient tolerated treatment well Patient left: with call bell/phone within reach;in chair;with chair alarm set Nurse Communication: Mobility status PT Visit Diagnosis: Unsteadiness on feet (R26.81);Other abnormalities of gait and mobility (R26.89);Muscle weakness (generalized) (M62.81);History of falling (Z91.81);Difficulty in walking, not elsewhere classified (R26.2);Pain     Time: [id number] PT Time Calculation (min) (ACUTE ONLY): 18 min  Charges:  $Therapeutic Activity: 8-22 mins                     Michelle Nasuti, PTA Acute Rehabilitation Services Secure Chat Preferred  [phone]     Michelle Nasuti 06/22/2022, 10:21 AM

## 2022-06-23 DIAGNOSIS — S82202A Unspecified fracture of shaft of left tibia, initial encounter for closed fracture: Secondary | ICD-10-CM

## 2022-06-23 NOTE — Care Management Important Message (Signed)
Important Message  Patient Details  Name: Megan Walter MRN: [id number] Date of Birth: September 28, 1945   Medicare Important Message Given:  Yes     Hannah Beat 06/23/2022, 1:52 PM

## 2022-06-23 NOTE — Progress Notes (Signed)
Physical Therapy Treatment Patient Details Name: Megan Walter MRN: [id number] DOB: 12/22/1945 Today's Date: 06/23/2022   History of Present Illness 77 yo female presenting to ED on 2/22 after fall at home. Sustained L distal radius fx with nondisplaced fracture of the ulnar styloid process and L displaced proximal tibial metaphysis fx. S/p ORIF of L tibial fx and ORIF of L wrist fx on 2/23. PMH including osteoporosis and R tibial fx (2005).    PT Comments    Pt was received sitting in the recliner and agreeable to session. Pt was able to stand from recliner with mod A after two unsuccessful attempts due to lower surface. Pt with complaints of L calf soreness once upright. Pt instructed in calf stretch with gait belt and seated exercises with pt able to demo back. Pt continues to benefit from PT services to progress toward functional mobility goals.     Recommendations for follow up therapy are one component of a multi-disciplinary discharge planning process, led by the attending physician.  Recommendations may be updated based on patient status, additional functional criteria and insurance authorization.  Follow Up Recommendations  Acute inpatient rehab (3hours/day)     Assistance Recommended at Discharge Frequent or constant Supervision/Assistance  Patient can return home with the following A lot of help with bathing/dressing/bathroom;Assistance with cooking/housework;Assist for transportation;Help with stairs or ramp for entrance;Two people to help with walking and/or transfers   Equipment Recommendations  Other (comment) (defer to next venue)    Recommendations for Other Services       Precautions / Restrictions Precautions Precautions: Fall Restrictions Weight Bearing Restrictions: Yes LUE Weight Bearing: Weight bearing as tolerated LLE Weight Bearing: Non weight bearing     Mobility  Bed Mobility               General bed mobility comments: Pt beginning and ending  session in the recliner    Transfers Overall transfer level: Needs assistance Equipment used: Left platform walker Transfers: Sit to/from Stand Sit to Stand: Mod assist           General transfer comment: from recliner to platform RW with mod A for power up after multiple unsuccessful attempts    Ambulation/Gait               General Gait Details: unable       Balance Overall balance assessment: Needs assistance Sitting-balance support: No upper extremity supported, Feet supported Sitting balance-Leahy Scale: Good Sitting balance - Comments: in recliner   Standing balance support: Bilateral upper extremity supported, During functional activity, Reliant on assistive device for balance Standing balance-Leahy Scale: Poor Standing balance comment: with L platform RW support                            Cognition Arousal/Alertness: Awake/alert Behavior During Therapy: WFL for tasks assessed/performed Overall Cognitive Status: Within Functional Limits for tasks assessed                                          Exercises Total Joint Exercises Quad Sets: AROM, Seated, 5 reps, Both Gluteal Sets: Seated, AROM, Both, 5 reps Heel Slides: AROM, Both, Seated, 5 reps Hip ABduction/ADduction: AROM, Seated, Both, 5 reps    General Comments        Pertinent Vitals/Pain Pain Assessment Pain Assessment: Faces Faces Pain Scale: Hurts  even more Pain Location: L calf Pain Descriptors / Indicators: Aching Pain Intervention(s): Limited activity within patient's tolerance, Monitored during session, Ice applied, Repositioned     PT Goals (current goals can now be found in the care plan section) Acute Rehab PT Goals Patient Stated Goal: Get well, have some therapy before going home again. PT Goal Formulation: With patient Time For Goal Achievement: 06/30/22 Potential to Achieve Goals: Good Progress towards PT goals: Progressing toward goals     Frequency    Min 5X/week      PT Plan Current plan remains appropriate       AM-PAC PT "6 Clicks" Mobility   Outcome Measure  Help needed turning from your back to your side while in a flat bed without using bedrails?: None Help needed moving from lying on your back to sitting on the side of a flat bed without using bedrails?: None Help needed moving to and from a bed to a chair (including a wheelchair)?: Total Help needed standing up from a chair using your arms (e.g., wheelchair or bedside chair)?: A Lot Help needed to walk in hospital room?: Total Help needed climbing 3-5 steps with a railing? : Total 6 Click Score: 13    End of Session Equipment Utilized During Treatment: Gait belt Activity Tolerance: Patient tolerated treatment well Patient left: with call bell/phone within reach;in chair Nurse Communication: Mobility status PT Visit Diagnosis: Unsteadiness on feet (R26.81);Other abnormalities of gait and mobility (R26.89);Muscle weakness (generalized) (M62.81);History of falling (Z91.81);Difficulty in walking, not elsewhere classified (R26.2);Pain     Time: 1010-1030 PT Time Calculation (min) (ACUTE ONLY): 20 min  Charges:  $Therapeutic Activity: 8-22 mins                     Michelle Nasuti, PTA Acute Rehabilitation Services Secure Chat Preferred  [phone]    Michelle Nasuti 06/23/2022, 10:47 AM

## 2022-06-23 NOTE — Progress Notes (Signed)
Occupational Therapy Treatment Patient Details Name: Megan Walter MRN: [id number] DOB: 07/25/1945 Today's Date: 06/23/2022   History of present illness 77 yo female presenting to ED on 2/22 after fall at home. Sustained L distal radius fx with nondisplaced fracture of the ulnar styloid process and L displaced proximal tibial metaphysis fx. S/p ORIF of L tibial fx and ORIF of L wrist fx on 2/23. PMH including osteoporosis and R tibial fx (2005).   OT comments  Pt. Seen for skilled OT treatment session.  Mod I for bed mobility with use of gait belt for leg lifter.  Pt. Making gains with mobility.  Able to complete sit/stand and pivot to R to recliner for simulated toileting task with mod a and cues for sequencing and safety.  Ice applied to LLE as pt. Cont. To c/o shin/calf pain.  Adjusted pillow placement LLE to aide in pt. Comfort for LLE. Cont. To progress adls as able.  Agree with current d/c recommendations.     Recommendations for follow up therapy are one component of a multi-disciplinary discharge planning process, led by the attending physician.  Recommendations may be updated based on patient status, additional functional criteria and insurance authorization.    Follow Up Recommendations  Acute inpatient rehab (3hours/day)     Assistance Recommended at Discharge Frequent or constant Supervision/Assistance  Patient can return home with the following  Two people to help with walking and/or transfers;Two people to help with bathing/dressing/bathroom;Assistance with cooking/housework;Assist for transportation;Help with stairs or ramp for entrance;A lot of help with bathing/dressing/bathroom;A lot of help with walking and/or transfers   Equipment Recommendations  BSC/3in1;Wheelchair (measurements OT);Wheelchair cushion (measurements OT);Tub/shower bench    Recommendations for Other Services      Precautions / Restrictions Precautions Precautions: Fall Restrictions Weight Bearing  Restrictions: Yes LUE Weight Bearing: Weight bearing as tolerated LLE Weight Bearing: Non weight bearing       Mobility Bed Mobility Overal bed mobility: Modified Independent Bed Mobility: Supine to Sit     Supine to sit: Modified independent (Device/Increase time)          Transfers Overall transfer level: Needs assistance Equipment used: Left platform walker Transfers: Sit to/from Stand, Bed to chair/wheelchair/BSC Sit to Stand: Mod assist Stand pivot transfers: Mod assist         General transfer comment: eob to recliner     Balance                                           ADL either performed or assessed with clinical judgement   ADL Overall ADL's : Needs assistance/impaired                         Toilet Transfer: Moderate assistance;Stand-pivot;Cueing for sequencing;Cueing for safety;Rolling walker (2 wheels) (L PFRW) Toilet Transfer Details (indicate cue type and reason): LPFRW simulated eob to recliner going to the right         Functional mobility during ADLs: Moderate assistance;Cueing for safety;Cueing for sequencing General ADL Comments: Pt with decreased strength, balance, ROM, and activity tolerance. Despite pain, pt very motivated    Air cabin crew      Cognition Arousal/Alertness: Awake/alert Behavior During Therapy: WFL for tasks  assessed/performed Overall Cognitive Status: Within Functional Limits for tasks assessed                                          Exercises      Shoulder Instructions       General Comments  Reports good family support with family living close to her.      Pertinent Vitals/ Pain       Pain Assessment Pain Assessment: Faces Faces Pain Scale: Hurts a little bit Pain Location: L calf Pain Descriptors / Indicators: Aching Pain Intervention(s): Limited activity within patient's tolerance,  Monitored during session, Repositioned, Ice applied  Home Living                                          Prior Functioning/Environment              Frequency  Min 3X/week        Progress Toward Goals  OT Goals(current goals can now be found in the care plan section)  Progress towards OT goals: Progressing toward goals     Plan Discharge plan remains appropriate    Co-evaluation                 AM-PAC OT "6 Clicks" Daily Activity     Outcome Measure   Help from another person eating meals?: None Help from another person taking care of personal grooming?: A Little Help from another person toileting, which includes using toliet, bedpan, or urinal?: A Lot Help from another person bathing (including washing, rinsing, drying)?: A Lot Help from another person to put on and taking off regular upper body clothing?: A Little Help from another person to put on and taking off regular lower body clothing?: A Lot 6 Click Score: 16    End of Session Equipment Utilized During Treatment: Gait belt;Rolling walker (2 wheels);Other (comment)  OT Visit Diagnosis: Unsteadiness on feet (R26.81);History of falling (Z91.81);Muscle weakness (generalized) (M62.81);Pain;Other abnormalities of gait and mobility (R26.89) Pain - Right/Left: Left Pain - part of body: Leg;Arm   Activity Tolerance Patient tolerated treatment well   Patient Left in chair;with call bell/phone within reach   Nurse Communication          Time: [id number] OT Time Calculation (min): 26 min  Charges: OT General Charges $OT Visit: 1 Visit OT Treatments $Self Care/Home Management : 23-37 mins  Sonia Baller, COTA/L Acute Rehabilitation [phone]   Clearnce Sorrel Lorraine-COTA/L 06/23/2022, 12:55 PM

## 2022-06-23 NOTE — Progress Notes (Signed)
  Progress Note   Patient: Megan Walter [id number] DOB: Aug 10, 1945 DOA: 06/14/2022     8 DOS: the patient was seen and examined on 06/23/2022   Brief hospital course:  Assessment and Plan:  Left wrist and left tibial plateau fracture  - 2/23, patient underwent ORIF of left tibial plateau fracture and left distal radius fractures. - DVT prophylaxis with Eliquis 2.5 mg twice daily for 30 days at discharge - Colace 100 mg PO bid  - Norco PRN  - Morphine PRN - Senna S 1 tab PO qhs    Severe vitamin D deficiency Osteoporosis - Vit D 50,000 units PO every Monday    Impaired mobility - Seen by PT. CIR recommended.   Bilateral lower extremity lymphedema Patient states her legs are always swollen.  No history of CHF.  Not on diuretics at home Patient states he did not have any feeling of legs heaviness or dragging sensation.  She does not think she would need any compression stockings at this time.   Rhabdomyolysis - CK downtrended (resolved)    Morbid Obesity  - Body mass index is 123XX123 kg/m - Complicating care   DVT prophylaxis: Eliquis 2.5 mg PO bid      Subjective: Pt seen and examined at the bedside. Pt is working on the appeal for inpatient rehab. Appreciate PT following along.  Physical Exam: Vitals:   06/22/22 1453 06/22/22 2000 06/23/22 0500 06/23/22 0800  BP: (!) 147/58 (!) 122/52 123/64 (!) 127/59  Pulse: 82 89 85 85  Resp: 18   18  Temp: 98 F (36.7 C) 98.6 F (37 C) 98.8 F (37.1 C) 98.5 F (36.9 C)  TempSrc: Oral Oral Oral Oral  SpO2: 97% 97% 96% 97%  Weight:      Height:       Physical Exam Constitutional:      Appearance: Normal appearance.  HENT:     Head: Normocephalic.     Mouth/Throat:     Mouth: Mucous membranes are moist.  Cardiovascular:     Rate and Rhythm: Normal rate and regular rhythm.  Pulmonary:     Effort: Pulmonary effort is normal.  Abdominal:     Palpations: Abdomen is soft.  Musculoskeletal:     Cervical back: Neck  supple.  Skin:    General: Skin is warm.  Neurological:     Mental Status: She is alert and oriented to person, place, and time.  Psychiatric:        Mood and Affect: Mood normal.    Data Reviewed:   Disposition: Status is: Inpatient  Planned Discharge Destination: Home    Time spent: 35 minutes  Author: Lucienne Minks , MD 06/23/2022 3:12 PM  For on call review [email or web address].

## 2022-06-23 NOTE — Progress Notes (Signed)
Inpatient Rehab Admissions Coordinator:  Saw pt at bedside. Appeal started today. Will continue to follow.   Gayland Curry, [place], [place] Admissions Coordinator [phone]

## 2022-06-24 LAB — COMPREHENSIVE METABOLIC PANEL
ALT: 30 U/L (ref 0–44)
AST: 30 U/L (ref 15–41)
Albumin: 2.8 g/dL — ABNORMAL LOW (ref 3.5–5.0)
Alkaline Phosphatase: 88 U/L (ref 38–126)
Anion gap: 7 (ref 5–15)
BUN: 14 mg/dL (ref 8–23)
CO2: 28 mmol/L (ref 22–32)
Calcium: 8.6 mg/dL — ABNORMAL LOW (ref 8.9–10.3)
Chloride: 103 mmol/L (ref 98–111)
Creatinine, Ser: 0.51 mg/dL (ref 0.44–1.00)
GFR, Estimated: >60 mL/min (ref >60)
Glucose, Bld: 104 mg/dL — ABNORMAL HIGH (ref 70–99)
Potassium: 3.1 mmol/L — ABNORMAL LOW (ref 3.5–5.1)
Sodium: 138 mmol/L (ref 135–145)
Total Bilirubin: 2.3 mg/dL — ABNORMAL HIGH (ref 0.3–1.2)
Total Protein: 5.9 g/dL — ABNORMAL LOW (ref 6.5–8.1)

## 2022-06-24 LAB — CBC
HCT: 32.6 % — ABNORMAL LOW (ref 36.0–46.0)
Hemoglobin: 10.8 g/dL — ABNORMAL LOW (ref 12.0–15.0)
MCH: 28.5 pg (ref 26.0–34.0)
MCHC: 33.1 g/dL (ref 30.0–36.0)
MCV: 86 fL (ref 80.0–100.0)
Platelets: 369 10*3/uL (ref 150–400)
RBC: 3.79 MIL/uL — ABNORMAL LOW (ref 3.87–5.11)
RDW: 13.1 % (ref 11.5–15.5)
WBC: 6.9 10*3/uL (ref 4.0–10.5)
nRBC: 0 % (ref 0.0–0.2)

## 2022-06-24 LAB — MAGNESIUM: Magnesium: 2.1 mg/dL (ref 1.7–2.4)

## 2022-06-24 MED ORDER — POTASSIUM CHLORIDE CRYS ER 20 MEQ PO TBCR
40.0000 meq | EXTENDED_RELEASE_TABLET | Freq: Once | ORAL | Status: AC
  Administered 2022-06-24: 40 meq via ORAL
  Filled 2022-06-24: qty 2

## 2022-06-24 NOTE — Progress Notes (Signed)
  Progress Note   Patient: Megan Walter [id number] DOB: June 20, 1945 DOA: 06/14/2022     9 DOS: the patient was seen and examined on 06/24/2022   Brief hospital course:  Assessment and Plan: Left wrist and left tibial plateau fracture  - 2/23, patient underwent ORIF of left tibial plateau fracture and left distal radius fractures. - DVT prophylaxis with Eliquis 2.5 mg twice daily for 30 days at discharge - Colace 100 mg PO bid  - Norco PRN  - Morphine PRN - Senna S 1 tab PO qhs    Severe vitamin D deficiency Osteoporosis - Vit D 50,000 units PO every Monday    Impaired mobility - Seen by PT. CIR recommended.   Bilateral lower extremity lymphedema - Patient states her legs are always swollen.  No history of CHF (Not on diuretics at home) - Patient states he did not have any feeling of legs heaviness or dragging sensation.  She does not think she would need any compression stockings at this time.   Rhabdomyolysis - CK downtrended (resolved)    Morbid Obesity  - Body mass index is 123XX123 kg/m - Complicating care    DVT prophylaxis: Eliquis 2.5 mg PO bid        Subjective: Pt seen and examined at the bedside. Pt is currently in the appeal process for inpatient rehab. She remains in the hospital during the appeal process.   Physical Exam: Vitals:   06/23/22 0800 06/23/22 2214 06/24/22 0345 06/24/22 0722  BP: (!) 127/59 (!) 141/66 (!) 130/57 125/63  Pulse: 85 85 81 83  Resp: '18 20 16 18  '$ Temp: 98.5 F (36.9 C) 98.1 F (36.7 C) 98.4 F (36.9 C) 98.4 F (36.9 C)  TempSrc: Oral Oral  Oral  SpO2: 97% 95% 97% 97%  Weight:   111.7 kg   Height:       Constitutional:      Appearance: Normal appearance.  HENT:     Head: Normocephalic.     Mouth/Throat:     Mouth: Mucous membranes are moist.  Cardiovascular:     Rate and Rhythm: Normal rate and regular rhythm.  Pulmonary:     Effort: Pulmonary effort is normal.  Abdominal:     Palpations: Abdomen is soft.   Musculoskeletal:     Cervical back: Neck supple.  Skin:    General: Skin is warm.  Neurological:     Mental Status: She is alert and oriented to person, place, and time.  Psychiatric:        Mood and Affect: Mood normal.   Data Reviewed:   Disposition: Status is: Inpatient  Planned Discharge Destination: Rehab    Time spent: 35 minutes  Author: Lucienne Minks , MD 06/24/2022 10:25 AM  For on call review [email or web address].

## 2022-06-24 NOTE — Progress Notes (Signed)
Physical Therapy Treatment Patient Details Name: Megan Walter MRN: [id number] DOB: [birth year] Today's Date: 06/24/2022   History of Present Illness 77 yo female presenting to ED on 2/22 after fall at home. Sustained L distal radius fx with nondisplaced fracture of the ulnar styloid process and L displaced proximal tibial metaphysis fx. S/p ORIF of L tibial fx and ORIF of L wrist fx on 2/23. PMH including osteoporosis and R tibial fx (2005).    PT Comments    Pt seated in recliner x 1 hour post OT tx.  Requesting to go to bed side commode and then back to bed.  Pt motivated during mobility this session.  Required max VCs for sequencing and safety throughout session. Pt performed multiple sit to stand and stand pivots as she lacks ability to clear R foot for hop to gt pattern.  Pt pivots on supporting R side and heavy mod assistance to move and manage RW.  Continues to recommend rehab in a post acute setting to maximize functional gains before returning home.       Recommendations for follow up therapy are one component of a multi-disciplinary discharge planning process, led by the attending physician.  Recommendations may be updated based on patient status, additional functional criteria and insurance authorization.  Follow Up Recommendations  Acute inpatient rehab (3hours/day)     Assistance Recommended at Discharge Frequent or constant Supervision/Assistance  Patient can return home with the following A lot of help with bathing/dressing/bathroom;Assistance with cooking/housework;Assist for transportation;Help with stairs or ramp for entrance;Two people to help with walking and/or transfers   Equipment Recommendations  Other (comment) (defer to next venue)    Recommendations for Other Services       Precautions / Restrictions Precautions Precautions: Fall Restrictions Weight Bearing Restrictions: Yes LUE Weight Bearing: Weight bear through elbow only LLE Weight Bearing: Non weight  bearing     Mobility  Bed Mobility Overal bed mobility: Modified Independent Bed Mobility: Sit to Supine       Sit to supine: Min assist   General bed mobility comments: min assistance to donn gt belt as a lasso.  Pt required increased time and effort and min assistance to move LLE back into bed as she lacked the strength to move her leg towards midline.    Transfers Overall transfer level: Needs assistance Equipment used: Left platform walker Transfers: Sit to/from Stand, Bed to chair/wheelchair/BSC Sit to Stand: Mod assist Stand pivot transfers: Mod assist         General transfer comment: Cues for hand placement and position/placement of LLE while rising into standing. Heavy mod assistance to boost into standing.  Unable to progress steps so performed STS x 3 chair>commode>chair>bed.  Lengthy rest break in recliner chair due to fatigue after transferring from commode. Pt did stand longer for pericare after using the commode.    Ambulation/Gait                   Stairs             Wheelchair Mobility    Modified Rankin (Stroke Patients Only)       Balance Overall balance assessment: Needs assistance Sitting-balance support: No upper extremity supported, Feet supported Sitting balance-Leahy Scale: Good       Standing balance-Leahy Scale: Poor                              Cognition Arousal/Alertness: Awake/alert Behavior  During Therapy: WFL for tasks assessed/performed Overall Cognitive Status: Within Functional Limits for tasks assessed                                          Exercises      General Comments        Pertinent Vitals/Pain Pain Assessment Pain Assessment: Faces Faces Pain Scale: Hurts even more Pain Location: L calf Pain Descriptors / Indicators: Aching Pain Intervention(s): Monitored during session, Repositioned    Home Living                          Prior Function             PT Goals (current goals can now be found in the care plan section) Acute Rehab PT Goals Patient Stated Goal: Get well, have some therapy before going home again. Potential to Achieve Goals: Good Progress towards PT goals: Progressing toward goals    Frequency    Min 5X/week      PT Plan Current plan remains appropriate    Co-evaluation              AM-PAC PT "6 Clicks" Mobility   Outcome Measure  Help needed turning from your back to your side while in a flat bed without using bedrails?: None Help needed moving from lying on your back to sitting on the side of a flat bed without using bedrails?: A Little Help needed moving to and from a bed to a chair (including a wheelchair)?: A Lot Help needed standing up from a chair using your arms (e.g., wheelchair or bedside chair)?: A Lot Help needed to walk in hospital room?: Total Help needed climbing 3-5 steps with a railing? : Total 6 Click Score: 13    End of Session Equipment Utilized During Treatment: Gait belt Activity Tolerance: Patient tolerated treatment well Patient left: with call bell/phone within reach;in chair Nurse Communication: Mobility status PT Visit Diagnosis: Unsteadiness on feet (R26.81);Other abnormalities of gait and mobility (R26.89);Muscle weakness (generalized) (M62.81);History of falling (Z91.81);Difficulty in walking, not elsewhere classified (R26.2);Pain Pain - Right/Left: Left Pain - part of body: Leg;Arm     Time: [id number] PT Time Calculation (min) (ACUTE ONLY): 19 min  Charges:  $Therapeutic Activity: 8-22 mins                     Erasmo Leventhal , PTA Acute Rehabilitation Services Office [phone]    Megan Walter Megan Walter 06/24/2022, 1:49 PM

## 2022-06-24 NOTE — Progress Notes (Signed)
Inpatient Rehab Admissions Coordinator:   I called and spoke with pt.'s daughter over the phone and she states that they would like to continue to wait on CIR appeal to be processed. That could be early next week.   Clemens Catholic, [place], [place] Admissions Coordinator  [phone] ([place]) [phone] (office)

## 2022-06-24 NOTE — Progress Notes (Signed)
Occupational Therapy Treatment Patient Details Name: Megan Walter MRN: [id number] DOB: 1946/02/10 Today's Date: 06/24/2022   History of present illness 77 yo female presenting to ED on 2/22 after fall at home. Sustained L distal radius fx with nondisplaced fracture of the ulnar styloid process and L displaced proximal tibial metaphysis fx. S/p ORIF of L tibial fx and ORIF of L wrist fx on 2/23. PMH including osteoporosis and R tibial fx (2005).   OT comments  Pt. Seen for skilled OT treatment session.  Pt. Able to transfer to L today to recliner with some small "hops" while maintaining nwb status with min/mod a and cues for slowing pace for safety during pivot.  Pt. Remains motivated and excellent candidate fir AIR level therapies prior to home.  Motivated to progress and has strong family support at home.  Dtr. Amy present at end of session and I answered all of her questions about AIR for her and the patient to understand how that unit works.  Also discussed possible appeal denial and answered questions with that also.     Recommendations for follow up therapy are one component of a multi-disciplinary discharge planning process, led by the attending physician.  Recommendations may be updated based on patient status, additional functional criteria and insurance authorization.    Follow Up Recommendations  Acute inpatient rehab (3hours/day)     Assistance Recommended at Discharge Frequent or constant Supervision/Assistance  Patient can return home with the following  Two people to help with walking and/or transfers;Two people to help with bathing/dressing/bathroom;Assistance with cooking/housework;Assist for transportation;Help with stairs or ramp for entrance;A lot of help with bathing/dressing/bathroom;A lot of help with walking and/or transfers   Equipment Recommendations  BSC/3in1;Wheelchair (measurements OT);Wheelchair cushion (measurements OT);Tub/shower bench    Recommendations for  Other Services      Precautions / Restrictions Precautions Precautions: Fall Restrictions Weight Bearing Restrictions: Yes LUE Weight Bearing: Weight bearing as tolerated LLE Weight Bearing: Non weight bearing       Mobility Bed Mobility Overal bed mobility: Modified Independent Bed Mobility: Supine to Sit     Supine to sit: Modified independent (Device/Increase time)          Transfers                         Balance                                           ADL either performed or assessed with clinical judgement   ADL Overall ADL's : Needs assistance/impaired                         Toilet Transfer: Moderate assistance;Stand-pivot;Cueing for sequencing;Cueing for safety;Rolling walker (2 wheels) Toilet Transfer Details (indicate cue type and reason): LPFRW simulated eob to recliner going to the left with some scoot/hop steps                Extremity/Trunk Assessment              Vision       Perception     Praxis      Cognition Arousal/Alertness: Awake/alert Behavior During Therapy: WFL for tasks assessed/performed Overall Cognitive Status: Within Functional Limits for tasks assessed  Exercises      Shoulder Instructions       General Comments      Pertinent Vitals/ Pain       Pain Assessment Pain Assessment: No/denies pain  Home Living                                          Prior Functioning/Environment              Frequency  Min 3X/week        Progress Toward Goals  OT Goals(current goals can now be found in the care plan section)  Progress towards OT goals: Progressing toward goals     Plan Discharge plan remains appropriate    Co-evaluation                 AM-PAC OT "6 Clicks" Daily Activity     Outcome Measure   Help from another person eating meals?: None Help from another  person taking care of personal grooming?: A Little Help from another person toileting, which includes using toliet, bedpan, or urinal?: A Lot Help from another person bathing (including washing, rinsing, drying)?: A Lot Help from another person to put on and taking off regular upper body clothing?: A Little Help from another person to put on and taking off regular lower body clothing?: A Lot 6 Click Score: 16    End of Session Equipment Utilized During Treatment: Gait belt;Rolling walker (2 wheels);Other (comment) (platform rolling walker)  OT Visit Diagnosis: Unsteadiness on feet (R26.81);History of falling (Z91.81);Muscle weakness (generalized) (M62.81);Pain;Other abnormalities of gait and mobility (R26.89) Pain - Right/Left: Left Pain - part of body: Leg;Arm   Activity Tolerance Patient tolerated treatment well   Patient Left in chair;with call bell/phone within reach;with family/visitor present   Nurse Communication Other (comment) (reviewed with rn plan for pt. to transfer to/from [facility] today vs. pure wik rn agreed)        [id number] OT Time Calculation (min): 24 min  Charges: OT General Charges $OT Visit: 1 Visit OT Treatments $Self Care/Home Management : 23-37 mins  Sonia Baller, COTA/L Acute Rehabilitation [phone]   Clearnce Sorrel Lorraine-COTA/L 06/24/2022, 12:11 PM

## 2022-06-25 NOTE — Progress Notes (Signed)
Occupational Therapy Treatment Patient Details Name: Megan Walter MRN: [id number] DOB: 1945/09/21 Today's Date: 06/25/2022   History of present illness 77 yo female presenting to ED on 2/22 after fall at home. Sustained L distal radius fx with nondisplaced fracture of the ulnar styloid process and L displaced proximal tibial metaphysis fx. S/p ORIF of L tibial fx and ORIF of L wrist fx on 2/23. PMH including osteoporosis and R tibial fx (2005).   OT comments  Pt. Seen for skilled OT treatment session.  Reports she has been using 3n1 consistently during the day.  Declines need for use as she had worked with PT already this am with transfers.  Provided instruction and demo for HEP to aide in increasing strength for transfers.  Modified chair push ups with use of r ue/rle only with light elbow use of LUE.  Educated on Benefits of pressure relief if sitting up longer periods of time and will aide in increasing independence with sit/stand transfers with nwb status of lle and elbow only LUE.  Pt remains eager and motivated for getting better.     Recommendations for follow up therapy are one component of a multi-disciplinary discharge planning process, led by the attending physician.  Recommendations may be updated based on patient status, additional functional criteria and insurance authorization.    Follow Up Recommendations  Acute inpatient rehab (3hours/day)     Assistance Recommended at Discharge Frequent or constant Supervision/Assistance  Patient can return home with the following  Two people to help with walking and/or transfers;Two people to help with bathing/dressing/bathroom;Assistance with cooking/housework;Assist for transportation;Help with stairs or ramp for entrance;A lot of help with bathing/dressing/bathroom;A lot of help with walking and/or transfers   Equipment Recommendations  BSC/3in1;Wheelchair (measurements OT);Wheelchair cushion (measurements OT);Tub/shower bench     Recommendations for Other Services      Precautions / Restrictions Precautions Precautions: Fall Restrictions LUE Weight Bearing: Weight bear through elbow only LLE Weight Bearing: Non weight bearing       Mobility Bed Mobility                    Transfers                         Balance                                           ADL either performed or assessed with clinical judgement   ADL Overall ADL's : Needs assistance/impaired                                       General ADL Comments: focused on HEP for increasing strength for safety with transfers and adls.  reports using bsc consistently except at night, getting up and transferring more    Extremity/Trunk Assessment              Vision       Perception     Praxis      Cognition Arousal/Alertness: Awake/alert Behavior During Therapy: WFL for tasks assessed/performed Overall Cognitive Status: Within Functional Limits for tasks assessed  Exercises General Exercises - Upper Extremity Chair Push Up: AROM, Right, Seated, 5 reps Other Exercises Other Exercises: pt. able to return demo for modified chair push ups with use of RUE and RLE to bring buttocks off of chair for pressure relief and cont. strengthening to aide in ease with sit/stands and transfers in compliance with LLE NWB and elbow only LUE.  Encouraged to complete 2-3x a day sets of 5.    Shoulder Instructions       General Comments opted to replace current L patfrom RW with youth-sized one    Pertinent Vitals/ Pain       Pain Assessment Pain Assessment: Faces Faces Pain Scale: Hurts a little bit Pain Location: L calf Pain Descriptors / Indicators: Aching Pain Intervention(s): Limited activity within patient's tolerance, Monitored during session, Repositioned  Home Living                                           Prior Functioning/Environment              Frequency  Min 3X/week        Progress Toward Goals  OT Goals(current goals can now be found in the care plan section)  Progress towards OT goals: Progressing toward goals     Plan Discharge plan remains appropriate    Co-evaluation                 AM-PAC OT "6 Clicks" Daily Activity     Outcome Measure   Help from another person eating meals?: None Help from another person taking care of personal grooming?: A Little Help from another person toileting, which includes using toliet, bedpan, or urinal?: A Lot Help from another person bathing (including washing, rinsing, drying)?: A Lot Help from another person to put on and taking off regular upper body clothing?: A Little Help from another person to put on and taking off regular lower body clothing?: A Lot 6 Click Score: 16    End of Session    OT Visit Diagnosis: Unsteadiness on feet (R26.81);History of falling (Z91.81);Muscle weakness (generalized) (M62.81);Pain;Other abnormalities of gait and mobility (R26.89) Pain - Right/Left: Left Pain - part of body: Leg;Arm   Activity Tolerance Patient tolerated treatment well   Patient Left in chair;with call bell/phone within reach   Nurse Communication Other (comment) (rn states ok to work with pt., reviewed chair alarm not in place as pt. does well asking for assistance when needed, rn agreed)        Time: [id number] OT Time Calculation (min): 9 min  Charges: OT General Charges $OT Visit: 1 Visit OT Treatments $Therapeutic Exercise: 8-22 mins  Sonia Baller, COTA/L Acute Rehabilitation [phone]   Clearnce Sorrel Lorraine-COTA/L 06/25/2022, 12:46 PM

## 2022-06-25 NOTE — Progress Notes (Signed)
  Progress Note   Patient: Megan Walter [id number] DOB: 03-12-1946 DOA: 06/14/2022     10 DOS: the patient was seen and examined on 06/25/2022   Brief hospital course:  Assessment and Plan: Left wrist and left tibial plateau fracture  - 2/23, patient underwent ORIF of left tibial plateau fracture and left distal radius fractures. - DVT prophylaxis with Eliquis 2.5 mg twice daily for 30 days at discharge - Colace 100 mg PO bid  - Norco PRN  - Morphine PRN - Senna S 1 tab PO qhs    Severe vitamin D deficiency Osteoporosis - Vit D 50,000 units PO every Monday    Impaired mobility - Seen by PT. CIR recommended.   Bilateral lower extremity lymphedema - Patient states her legs are always swollen.  No history of CHF (Not on diuretics at home) - Patient states he did not have any feeling of legs heaviness or dragging sensation.  She does not think she would need any compression stockings at this time.   Rhabdomyolysis - CK downtrended (resolved)    Morbid Obesity  - Body mass index is 123XX123 kg/m - Complicating care    DVT prophylaxis: Eliquis 2.5 mg PO bid        Subjective: Pt seen and examined at the bedside. Pt is still in the appeals process for her candidacy for [facility].  As today is Sunday this appeal process will continue into Mon 06/26/2022.  Physical Exam: Vitals:   06/24/22 0722 06/24/22 2018 06/25/22 0405 06/25/22 0752  BP: 125/63 (!) 114/50 138/60 (!) 115/56  Pulse: 83 95 84 84  Resp: '18 18 17   '$ Temp: 98.4 F (36.9 C) 98.4 F (36.9 C) 98 F (36.7 C) 97.9 F (36.6 C)  TempSrc: Oral   Oral  SpO2: 97% 99% 97% 100%  Weight:      Height:       Constitutional:      Appearance: Normal appearance.  HENT:     Head: Normocephalic.     Mouth/Throat:     Mouth: Mucous membranes are moist.  Cardiovascular:     Rate and Rhythm: Normal rate and regular rhythm.  Pulmonary:     Effort: Pulmonary effort is normal.  Abdominal:     Palpations: Abdomen is soft.   Musculoskeletal:     Cervical back: Neck supple.  Skin:    General: Skin is warm.  Neurological:     Mental Status: She is alert and oriented to person, place, and time.  Psychiatric:        Mood and Affect: Mood normal.   Data Reviewed:   Disposition: Status is: Inpatient  Planned Discharge Destination: Rehab    Time spent: 35 minutes  Author: Lucienne Minks , MD 06/25/2022 12:24 PM  For on call review [email or web address].

## 2022-06-25 NOTE — Progress Notes (Signed)
Physical Therapy Treatment Patient Details Name: Megan Walter MRN: [id number] DOB: 27-Aug-1945 Today's Date: 06/25/2022   History of Present Illness 77 yo female presenting to ED on 2/22 after fall at home. Sustained L distal radius fx with nondisplaced fracture of the ulnar styloid process and L displaced proximal tibial metaphysis fx. S/p ORIF of L tibial fx and ORIF of L wrist fx on 2/23. PMH including osteoporosis and R tibial fx (2005).    PT Comments    Continuing work on functional mobility and activity tolerance;  Notable progress with activity tolerance, and very good adherence to WB precautions LLE and LUE; smoother transitions; very motivated; a post-acute rehab stay at [facility] will get her to a solid functional level to be able to dc home well   Recommendations for follow up therapy are one component of a multi-disciplinary discharge planning process, led by the attending physician.  Recommendations may be updated based on patient status, additional functional criteria and insurance authorization.  Follow Up Recommendations  Acute inpatient rehab (3hours/day)     Assistance Recommended at Discharge Frequent or constant Supervision/Assistance  Patient can return home with the following A lot of help with bathing/dressing/bathroom;Assistance with cooking/housework;Assist for transportation;Help with stairs or ramp for entrance;Two people to help with walking and/or transfers   Equipment Recommendations  Wheelchair (measurements PT);Wheelchair cushion (measurements PT);BSC/3in1 (L platform RW)    Recommendations for Other Services Rehab consult     Precautions / Restrictions Precautions Precautions: Fall Restrictions Weight Bearing Restrictions: Yes LUE Weight Bearing: Weight bear through elbow only LLE Weight Bearing: Non weight bearing     Mobility  Bed Mobility Overal bed mobility: Needs Assistance Bed Mobility: Supine to Sit     Supine to sit: Min assist      General bed mobility comments: good initiation and movement of LEs towards EOB; min handheld assist to pull trunk upright to sit    Transfers Overall transfer level: Needs assistance Equipment used: Left platform walker Transfers: Sit to/from Stand, Bed to chair/wheelchair/BSC Sit to Stand: Mod assist Stand pivot transfers: Mod assist         General transfer comment: Cues for hand placement; opted to stand from elevated bed; Took slow, heel-toe pivot steps on RLE with demo cues for performance; Good maintenance of LLE NWB once cued    Ambulation/Gait                   Stairs             Wheelchair Mobility    Modified Rankin (Stroke Patients Only)       Balance     Sitting balance-Leahy Scale: Good       Standing balance-Leahy Scale: Poor Standing balance comment: with L platform RW support                            Cognition Arousal/Alertness: Awake/alert Behavior During Therapy: WFL for tasks assessed/performed Overall Cognitive Status: Within Functional Limits for tasks assessed                                          Exercises      General Comments General comments (skin integrity, edema, etc.): opted to replace current L patfrom RW with youth-sized one      Pertinent Vitals/Pain Pain Assessment Pain Assessment: Faces Faces Pain  Scale: Hurts little more Pain Location: L calf Pain Descriptors / Indicators: Aching Pain Intervention(s): Monitored during session    Home Living                          Prior Function            PT Goals (current goals can now be found in the care plan section) Acute Rehab PT Goals Patient Stated Goal: Get well, have some therapy before going home again. PT Goal Formulation: With patient Time For Goal Achievement: 06/30/22 Potential to Achieve Goals: Good Progress towards PT goals: Progressing toward goals    Frequency    Min 5X/week      PT  Plan Current plan remains appropriate    Co-evaluation              AM-PAC PT "6 Clicks" Mobility   Outcome Measure  Help needed turning from your back to your side while in a flat bed without using bedrails?: None Help needed moving from lying on your back to sitting on the side of a flat bed without using bedrails?: A Little Help needed moving to and from a bed to a chair (including a wheelchair)?: A Lot Help needed standing up from a chair using your arms (e.g., wheelchair or bedside chair)?: A Lot Help needed to walk in hospital room?: Total Help needed climbing 3-5 steps with a railing? : Total 6 Click Score: 13    End of Session Equipment Utilized During Treatment: Gait belt Activity Tolerance: Patient tolerated treatment well Patient left: with call bell/phone within reach;in chair Nurse Communication: Mobility status PT Visit Diagnosis: Unsteadiness on feet (R26.81);Other abnormalities of gait and mobility (R26.89);Muscle weakness (generalized) (M62.81);History of falling (Z91.81);Difficulty in walking, not elsewhere classified (R26.2);Pain Pain - Right/Left: Left Pain - part of body: Leg;Arm     Time: [id number] PT Time Calculation (min) (ACUTE ONLY): 27 min  Charges:  $Therapeutic Activity: 23-37 mins                     Roney Marion, PT  Acute Rehabilitation Services Office [phone]    Megan Walter 06/25/2022, 11:56 AM

## 2022-06-25 NOTE — Plan of Care (Signed)
  Problem: Health Behavior/Discharge Planning: Goal: Ability to manage health-related needs will improve Outcome: Progressing   Problem: Activity: Goal: Risk for activity intolerance will decrease Outcome: Progressing   Problem: Coping: Goal: Level of anxiety will decrease Outcome: Progressing   

## 2022-06-26 ENCOUNTER — Encounter (HOSPITAL_COMMUNITY): Payer: Self-pay | Admitting: Physical Medicine and Rehabilitation

## 2022-06-26 ENCOUNTER — Other Ambulatory Visit: Payer: Self-pay

## 2022-06-26 ENCOUNTER — Inpatient Hospital Stay (HOSPITAL_COMMUNITY)
Admission: RE | Admit: 2022-06-26 | Discharge: 2022-06-30 | DRG: 945 | Disposition: A | Discharge status: Home Health | Payer: Medicare Other | Source: Intra-hospital | Attending: Physical Medicine and Rehabilitation | Admitting: Physical Medicine and Rehabilitation

## 2022-06-26 ENCOUNTER — Encounter (HOSPITAL_COMMUNITY): Payer: Self-pay | Admitting: Student

## 2022-06-26 DIAGNOSIS — Z7901 Long term (current) use of anticoagulants: Secondary | ICD-10-CM

## 2022-06-26 DIAGNOSIS — T1490XA Injury, unspecified, initial encounter: Secondary | ICD-10-CM | POA: Diagnosis not present

## 2022-06-26 DIAGNOSIS — S82142D Displaced bicondylar fracture of left tibia, subsequent encounter for closed fracture with routine healing: Secondary | ICD-10-CM

## 2022-06-26 DIAGNOSIS — S82142S Displaced bicondylar fracture of left tibia, sequela: Secondary | ICD-10-CM

## 2022-06-26 DIAGNOSIS — G4700 Insomnia, unspecified: Secondary | ICD-10-CM | POA: Diagnosis not present

## 2022-06-26 DIAGNOSIS — W010XXD Fall on same level from slipping, tripping and stumbling without subsequent striking against object, subsequent encounter: Secondary | ICD-10-CM | POA: Diagnosis present

## 2022-06-26 DIAGNOSIS — S62102A Fracture of unspecified carpal bone, left wrist, initial encounter for closed fracture: Secondary | ICD-10-CM | POA: Diagnosis not present

## 2022-06-26 DIAGNOSIS — E559 Vitamin D deficiency, unspecified: Secondary | ICD-10-CM | POA: Diagnosis present

## 2022-06-26 DIAGNOSIS — Z8249 Family history of ischemic heart disease and other diseases of the circulatory system: Secondary | ICD-10-CM

## 2022-06-26 DIAGNOSIS — M25462 Effusion, left knee: Secondary | ICD-10-CM | POA: Diagnosis not present

## 2022-06-26 DIAGNOSIS — Z79899 Other long term (current) drug therapy: Secondary | ICD-10-CM

## 2022-06-26 DIAGNOSIS — R17 Unspecified jaundice: Secondary | ICD-10-CM

## 2022-06-26 DIAGNOSIS — T796XXS Traumatic ischemia of muscle, sequela: Secondary | ICD-10-CM | POA: Diagnosis not present

## 2022-06-26 DIAGNOSIS — Z6841 Body Mass Index (BMI) 40.0 and over, adult: Secondary | ICD-10-CM

## 2022-06-26 DIAGNOSIS — S52502D Unspecified fracture of the lower end of left radius, subsequent encounter for closed fracture with routine healing: Secondary | ICD-10-CM

## 2022-06-26 DIAGNOSIS — S52615D Nondisplaced fracture of left ulna styloid process, subsequent encounter for closed fracture with routine healing: Secondary | ICD-10-CM | POA: Diagnosis not present

## 2022-06-26 DIAGNOSIS — E8809 Other disorders of plasma-protein metabolism, not elsewhere classified: Secondary | ICD-10-CM | POA: Diagnosis present

## 2022-06-26 DIAGNOSIS — S82102A Unspecified fracture of upper end of left tibia, initial encounter for closed fracture: Secondary | ICD-10-CM | POA: Diagnosis not present

## 2022-06-26 DIAGNOSIS — R739 Hyperglycemia, unspecified: Secondary | ICD-10-CM | POA: Diagnosis not present

## 2022-06-26 DIAGNOSIS — S82202A Unspecified fracture of shaft of left tibia, initial encounter for closed fracture: Secondary | ICD-10-CM | POA: Diagnosis not present

## 2022-06-26 DIAGNOSIS — M810 Age-related osteoporosis without current pathological fracture: Secondary | ICD-10-CM | POA: Diagnosis not present

## 2022-06-26 DIAGNOSIS — E876 Hypokalemia: Secondary | ICD-10-CM | POA: Diagnosis not present

## 2022-06-26 DIAGNOSIS — R600 Localized edema: Secondary | ICD-10-CM | POA: Diagnosis not present

## 2022-06-26 DIAGNOSIS — D62 Acute posthemorrhagic anemia: Secondary | ICD-10-CM | POA: Diagnosis not present

## 2022-06-26 DIAGNOSIS — S76102A Unspecified injury of left quadriceps muscle, fascia and tendon, initial encounter: Secondary | ICD-10-CM | POA: Diagnosis not present

## 2022-06-26 DIAGNOSIS — R5381 Other malaise: Secondary | ICD-10-CM | POA: Diagnosis not present

## 2022-06-26 DIAGNOSIS — S62102S Fracture of unspecified carpal bone, left wrist, sequela: Secondary | ICD-10-CM

## 2022-06-26 LAB — CBC
HCT: 33.5 % — ABNORMAL LOW (ref 36.0–46.0)
Hemoglobin: 10.6 g/dL — ABNORMAL LOW (ref 12.0–15.0)
MCH: 27.9 pg (ref 26.0–34.0)
MCHC: 31.6 g/dL (ref 30.0–36.0)
MCV: 88.2 fL (ref 80.0–100.0)
Platelets: 381 10*3/uL (ref 150–400)
RBC: 3.8 MIL/uL — ABNORMAL LOW (ref 3.87–5.11)
RDW: 13.2 % (ref 11.5–15.5)
WBC: 6.5 10*3/uL (ref 4.0–10.5)
nRBC: 0 % (ref 0.0–0.2)

## 2022-06-26 LAB — COMPREHENSIVE METABOLIC PANEL
ALT: 24 U/L (ref 0–44)
AST: 20 U/L (ref 15–41)
Albumin: 2.7 g/dL — ABNORMAL LOW (ref 3.5–5.0)
Alkaline Phosphatase: 94 U/L (ref 38–126)
Anion gap: 9 (ref 5–15)
BUN: 12 mg/dL (ref 8–23)
CO2: 24 mmol/L (ref 22–32)
Calcium: 8.7 mg/dL — ABNORMAL LOW (ref 8.9–10.3)
Chloride: 106 mmol/L (ref 98–111)
Creatinine, Ser: 0.66 mg/dL (ref 0.44–1.00)
GFR, Estimated: >60 mL/min (ref >60)
Glucose, Bld: 107 mg/dL — ABNORMAL HIGH (ref 70–99)
Potassium: 3.9 mmol/L (ref 3.5–5.1)
Sodium: 139 mmol/L (ref 135–145)
Total Bilirubin: 1.6 mg/dL — ABNORMAL HIGH (ref 0.3–1.2)
Total Protein: 5.8 g/dL — ABNORMAL LOW (ref 6.5–8.1)

## 2022-06-26 LAB — MAGNESIUM: Magnesium: 2.2 mg/dL (ref 1.7–2.4)

## 2022-06-26 MED ORDER — METHOCARBAMOL 500 MG PO TABS: 500.0000 mg | ORAL_TABLET | Freq: Four times a day (QID) | ORAL | Status: DC | PRN

## 2022-06-26 MED ORDER — CALAMINE EX LOTN: 1.0000 | TOPICAL_LOTION | Freq: Two times a day (BID) | CUTANEOUS | 0 refills | Status: DC

## 2022-06-26 MED ORDER — TRAZODONE HCL 50 MG PO TABS: 25.0000 mg | ORAL_TABLET | Freq: Every evening | ORAL | Status: DC | PRN

## 2022-06-26 MED ORDER — SENNOSIDES-DOCUSATE SODIUM 8.6-50 MG PO TABS
1.0000 | ORAL_TABLET | Freq: Every day | ORAL | Status: DC
  Administered 2022-06-26: 1 via ORAL
  Filled 2022-06-26 (×3): qty 1

## 2022-06-26 MED ORDER — VITAMIN D (ERGOCALCIFEROL) 1.25 MG (50000 UNIT) PO CAPS: 50000.0000 [IU] | ORAL_CAPSULE | ORAL | Status: DC

## 2022-06-26 MED ORDER — PROCHLORPERAZINE 25 MG RE SUPP: 12.5000 mg | Freq: Four times a day (QID) | RECTAL | Status: DC | PRN

## 2022-06-26 MED ORDER — APIXABAN 2.5 MG PO TABS
2.5000 mg | ORAL_TABLET | Freq: Two times a day (BID) | ORAL | Status: DC
  Administered 2022-06-26 – 2022-06-30 (×8): 2.5 mg via ORAL
  Filled 2022-06-26 (×9): qty 1

## 2022-06-26 MED ORDER — ACETAMINOPHEN 325 MG PO TABS
325.0000 mg | ORAL_TABLET | ORAL | Status: DC | PRN
  Administered 2022-06-26 – 2022-06-30 (×6): 650 mg via ORAL
  Filled 2022-06-26 (×6): qty 2

## 2022-06-26 MED ORDER — PROCHLORPERAZINE MALEATE 5 MG PO TABS: 5.0000 mg | ORAL_TABLET | Freq: Four times a day (QID) | ORAL | Status: DC | PRN

## 2022-06-26 MED ORDER — LORATADINE 10 MG PO TABS: 10.0000 mg | ORAL_TABLET | Freq: Every day | ORAL | Status: DC | PRN

## 2022-06-26 MED ORDER — PROCHLORPERAZINE EDISYLATE 10 MG/2ML IJ SOLN: 5.0000 mg | Freq: Four times a day (QID) | INTRAMUSCULAR | Status: DC | PRN

## 2022-06-26 MED ORDER — APIXABAN 2.5 MG PO TABS: 2.5000 mg | ORAL_TABLET | Freq: Two times a day (BID) | ORAL | Status: DC

## 2022-06-26 MED ORDER — FLEET ENEMA 7-19 GM/118ML RE ENEM: 1.0000 | ENEMA | Freq: Once | RECTAL | Status: DC | PRN

## 2022-06-26 MED ORDER — ALUM & MAG HYDROXIDE-SIMETH 200-200-20 MG/5ML PO SUSP: 30.0000 mL | ORAL | Status: DC | PRN

## 2022-06-26 MED ORDER — POLYETHYLENE GLYCOL 3350 17 G PO PACK: 17.0000 g | PACK | Freq: Every day | ORAL | 0 refills | Status: DC | PRN

## 2022-06-26 MED ORDER — HYDROCODONE-ACETAMINOPHEN 7.5-325 MG PO TABS
1.0000 | ORAL_TABLET | ORAL | Status: DC | PRN
  Administered 2022-06-27: 1 via ORAL
  Administered 2022-06-28: 2 via ORAL
  Filled 2022-06-26: qty 1
  Filled 2022-06-26 (×2): qty 2

## 2022-06-26 MED ORDER — POLYETHYLENE GLYCOL 3350 17 G PO PACK: 17.0000 g | PACK | Freq: Every day | ORAL | Status: DC | PRN

## 2022-06-26 MED ORDER — METHOCARBAMOL 500 MG PO TABS
500.0000 mg | ORAL_TABLET | Freq: Four times a day (QID) | ORAL | Status: DC | PRN
  Administered 2022-06-27 (×2): 500 mg via ORAL
  Filled 2022-06-26 (×2): qty 1

## 2022-06-26 MED ORDER — BISACODYL 10 MG RE SUPP: 10.0000 mg | Freq: Every day | RECTAL | Status: DC | PRN

## 2022-06-26 MED ORDER — SENNOSIDES-DOCUSATE SODIUM 8.6-50 MG PO TABS: 1.0000 | ORAL_TABLET | Freq: Every day | ORAL | Status: DC

## 2022-06-26 MED ORDER — MELATONIN 3 MG PO TABS
3.0000 mg | ORAL_TABLET | Freq: Every day | ORAL | Status: DC
  Administered 2022-06-26 – 2022-06-29 (×4): 3 mg via ORAL
  Filled 2022-06-26 (×4): qty 1

## 2022-06-26 MED ORDER — DOCUSATE SODIUM 100 MG PO CAPS: 100.0000 mg | ORAL_CAPSULE | Freq: Two times a day (BID) | ORAL | 0 refills | Status: DC

## 2022-06-26 MED ORDER — CALAMINE EX LOTN
1.0000 | TOPICAL_LOTION | Freq: Two times a day (BID) | CUTANEOUS | Status: DC
  Filled 2022-06-26: qty 177

## 2022-06-26 MED ORDER — FLUTICASONE PROPIONATE 50 MCG/ACT NA SUSP: 2.0000 | Freq: Every day | NASAL | Status: DC | PRN

## 2022-06-26 MED ORDER — HYDROCODONE-ACETAMINOPHEN 5-325 MG PO TABS: 1.0000 | ORAL_TABLET | ORAL | 0 refills | Status: DC | PRN

## 2022-06-26 MED ORDER — DIPHENHYDRAMINE HCL 12.5 MG/5ML PO ELIX: 12.5000 mg | ORAL_SOLUTION | Freq: Four times a day (QID) | ORAL | Status: DC | PRN

## 2022-06-26 MED ORDER — GUAIFENESIN-DM 100-10 MG/5ML PO SYRP: 5.0000 mL | ORAL_SOLUTION | Freq: Four times a day (QID) | ORAL | Status: DC | PRN

## 2022-06-26 MED ORDER — MELATONIN 3 MG PO TABS: 3.0000 mg | ORAL_TABLET | Freq: Every day | ORAL | 0 refills | Status: DC

## 2022-06-26 NOTE — Progress Notes (Signed)
Physical Therapy Treatment Patient Details Name: Megan Walter MRN: [id number] DOB: 07-May-1945 Today's Date: 06/26/2022   History of Present Illness 77 yo female presenting to ED on 2/22 after fall at home. Sustained L distal radius fx with nondisplaced fracture of the ulnar styloid process and L displaced proximal tibial metaphysis fx. S/p ORIF of L tibial fx and ORIF of L wrist fx on 2/23. PMH including osteoporosis and R tibial fx (2005).    PT Comments    Pt is slowly progressing towards goals but due to WB precautions and bil UE strength has not been able to progress gait.  Pt is unable to clear her RLE from the floor with transfers and performs a pivot on the forefoot. Pt was Mod-Max A when assisting with peri-care at [facility]. Due to current functional status, home set up and available assistance at home recommending skilled physical therapy services at a higher level of care on discharge from acute care hospital setting in order to decrease risk for falls, injury and re-hospitalization. Pt demonstrates no signs/symptoms of cardiac/respiratory distress throughout session.    Recommendations for follow up therapy are one component of a multi-disciplinary discharge planning process, led by the attending physician.  Recommendations may be updated based on patient status, additional functional criteria and insurance authorization.  Follow Up Recommendations  Acute inpatient rehab (3hours/day)     Assistance Recommended at Discharge Frequent or constant Supervision/Assistance  Patient can return home with the following Assistance with cooking/housework;Assist for transportation;Help with stairs or ramp for entrance;Two people to help with walking and/or transfers   Equipment Recommendations  Wheelchair (measurements PT);Wheelchair cushion (measurements PT);BSC/3in1;Standard walker (L platform walker)    Recommendations for Other Services       Precautions / Restrictions  Precautions Precautions: Fall Restrictions Weight Bearing Restrictions: Yes LUE Weight Bearing: Weight bear through elbow only LLE Weight Bearing: Non weight bearing     Mobility  Bed Mobility Overal bed mobility: Needs Assistance Bed Mobility: Supine to Sit     Supine to sit: Min assist     General bed mobility comments: set up with belt to help move the LLE. Patient Response: Cooperative  Transfers Overall transfer level: Needs assistance Equipment used: Left platform walker Transfers: Sit to/from Stand, Bed to chair/wheelchair/BSC Sit to Stand: Min assist, +2 safety/equipment Stand pivot transfers: Min assist, +2 safety/equipment         General transfer comment: Took slow, heel-toe pivot steps on RLE; Good maintenance of LLE NWB using therapist foot to gauge compliance.    Ambulation/Gait               General Gait Details: unable pt is unable to clear her R foot from the floro to progress gait.        Balance Overall balance assessment: Needs assistance Sitting-balance support: No upper extremity supported, Feet supported Sitting balance-Leahy Scale: Good Sitting balance - Comments: in recliner   Standing balance support: Bilateral upper extremity supported, During functional activity, Reliant on assistive device for balance Standing balance-Leahy Scale: Poor Standing balance comment: with L platform RW support pt attempted to perform perneal care standing at [facility] at Max A with very minimal toe WB on the L. When cued pt stated there was no wgt through the L foot; there was extension through all toes and most likely 25% WB or less.        Cognition Arousal/Alertness: Awake/alert Behavior During Therapy: WFL for tasks assessed/performed Overall Cognitive Status: Within Functional Limits for tasks  assessed               Pertinent Vitals/Pain Pain Assessment Pain Assessment: Faces Faces Pain Scale: Hurts a little bit Pain Location: L calf Pain  Descriptors / Indicators: Aching Pain Intervention(s): Monitored during session     PT Goals (current goals can now be found in the care plan section) Acute Rehab PT Goals Patient Stated Goal: Get well, have some therapy before going home again. PT Goal Formulation: With patient Time For Goal Achievement: 06/30/22 Potential to Achieve Goals: Good Progress towards PT goals: Progressing toward goals    Frequency    Min 5X/week      PT Plan Current plan remains appropriate    Co-evaluation PT/OT/SLP Co-Evaluation/Treatment: Yes Reason for Co-Treatment: For patient/therapist safety;To address functional/ADL transfers PT goals addressed during session: Mobility/safety with mobility;Balance;Proper use of DME        AM-PAC PT "6 Clicks" Mobility   Outcome Measure  Help needed turning from your back to your side while in a flat bed without using bedrails?: None Help needed moving from lying on your back to sitting on the side of a flat bed without using bedrails?: A Little Help needed moving to and from a bed to a chair (including a wheelchair)?: A Lot Help needed standing up from a chair using your arms (e.g., wheelchair or bedside chair)?: A Lot Help needed to walk in hospital room?: Total Help needed climbing 3-5 steps with a railing? : Total 6 Click Score: 13    End of Session Equipment Utilized During Treatment: Gait belt Activity Tolerance: Patient tolerated treatment well Patient left: with call bell/phone within reach;in chair Nurse Communication: Mobility status PT Visit Diagnosis: Unsteadiness on feet (R26.81);Other abnormalities of gait and mobility (R26.89);Muscle weakness (generalized) (M62.81);History of falling (Z91.81);Difficulty in walking, not elsewhere classified (R26.2);Pain Pain - Right/Left: Left Pain - part of body: Leg;Arm     Time: [id number] PT Time Calculation (min) (ACUTE ONLY): 22 min  Charges:  $Therapeutic Activity: 8-22 mins                      Tomma Rakers, DPT, CLT  Acute Rehabilitation Services Office: [phone] (Secure chat preferred)    Ander Purpura 06/26/2022, 11:09 AM

## 2022-06-26 NOTE — Progress Notes (Incomplete)
Inpatient Rehabilitation Admission Medication Review by a Pharmacist  A complete drug regimen review was completed for this patient to identify any potential clinically significant medication issues.  High Risk Drug Classes Is patient taking? Indication by Medication  Antipsychotic Yes Compazine- N/V  Anticoagulant Yes Apixaban- vte ppx s/p ortho surgery to end 07/21/2022  Antibiotic No   Opioid Yes Norco- post-op acute pain  Antiplatelet No   Hypoglycemics/insulin No   Vasoactive Medication No   Chemotherapy No   Other Yes Melatonin- sleep Claritin- seasonal allergies Flonase seasonal rhinitis Robaxin- muscle spasms Vitamin D- vitamin D deficiency     Type of Medication Issue Identified Description of Issue Recommendation(s)  Drug Interaction(s) (clinically significant)     Duplicate Therapy     Allergy     No Medication Administration End Date     Incorrect Dose     Additional Drug Therapy Needed     Significant med changes from prior encounter (inform family/care partners about these prior to discharge).    Other  PTA meds: Calcium w/ vit D  Zyrtec Vitamin D macrobid Restart PTA meds when and if necessary during CIR admission or at time of discharge, if warranted    Clinically significant medication issues were identified that warrant physician communication and completion of prescribed/recommended actions by midnight of the next day:  No   Time spent performing this drug regimen review (minutes):  30   Chantilly Linskey BS, PharmD, BCPS Clinical Pharmacist 06/26/2022 12:32 PM  Contact: [phone] after 3 PM  "Be curious, not judgmental..." -Jamal Maes

## 2022-06-26 NOTE — Discharge Summary (Signed)
Physician Discharge Summary  Megan Walter [id number] DOB: Jan 21, 1946 DOA: 06/14/2022  PCP: Megan Loffler, DO  Admit date: 06/14/2022 Discharge date: 06/26/2022  Admitted From: Home Discharge disposition: CIR  Brief narrative: Megan Walter is a 77 y.o. female with PMH significant for osteoporosis 2/21, patient presented to the ED after a fall leading to immediate left arm and left leg pain.  Patient reportedly was coming out of her garage when her foot slipped on the step and she fell down landing on her left side.  Denies any loss of consciousness or trauma to her head.   In the ED, afebrile, hemodynamically stable X-rays of the left leg revealed acute communicated and displaced fractures of the proximal tibial metaphysis and transverse communicated and mildly displaced fractures of the proximal fibular shaft.  X-rays of the left wrist noted noted communicated impacted fractures of the distal left radius with nondisplaced fracture of the ulnar styloid process.   Labs 2/21 showed WBC 17.7 and potassium 5.2.   EDP applied patient's left wrist in a sugar-tong splint.   Orthopedics was consulted with plans for ORIF of the left wrist fracture and tibial plateau fracture.  Started on pain meds. Admitted to [facility] 2/23, patient underwent ORIF of left tibial plateau fracture and left distal radius fractures. Hospitalist was prolonged because insurance denied CIR.  It was appealed and ultimately approved. Stable for discharge to CIR today 3/4.  Subjective: Patient was seen and examined this morning.  Sitting up in recliner.  Not in distress. Lying on bed.  Not in distress.   Hospital course: Left wrist and left tibial plateau fracture  secondary to fall X-rays as above. Orthopedics consult appreciated. 2/23, patient underwent ORIF of left tibial plateau fracture and left distal radius fractures. Continue pain management with as needed meds DVT prophylaxis with Eliquis 2.5 mg twice  daily for 30 days at discharge. Continue bowel regimen scheduled and as needed.  Severe vitamin D deficiency Osteoporosis Vitamin D level significantly low at 17 only. Ordered for 50,000 units every week for 8 weeks  Impaired mobility Per orthopedics, NWB LLE and NWB through wrist but can weight-bear through elbow with platform. Seen by PT. CIR recommended.  Bilateral lower extremity lymphedema Patient states her legs are always swollen.  No history of CHF.  Not on diuretics at home Patient states he did not have any feeling of legs heaviness or dragging sensation.  She does not think she would need any compression stockings at this time.  Rhabdomyolysis CK level gradually improved IV fluid.    Morbid Obesity  Body mass index is 40.98 kg/m. Patient has been advised to make an attempt to improve diet and exercise patterns to aid in weight loss.   Goals of care   Code Status: Full Code   Wounds:  - Incision (Closed) 06/16/22 Leg Left (Active)  Date First Assessed/Time First Assessed: 06/16/22 1642   Location: Leg  Location Orientation: Left    Assessments 06/16/2022  5:35 PM 06/26/2022  8:45 AM  Dressing Type Compression wrap Liquid skin adhesive  Dressing Clean, Dry, Intact Clean, Dry, Intact  Dressing Change Frequency -- PRN  Site / Wound Assessment Dressing in place / Unable to assess Clean;Dry  Margins -- Attached edges (approximated)  Closure -- Skin glue  Drainage Amount None --     No associated orders.     Incision (Closed) 06/16/22 Wrist Left (Active)  Date First Assessed/Time First Assessed: 06/16/22 1642   Location: Wrist  Location Orientation: Left    Assessments 06/16/2022  5:35 PM 06/26/2022  8:45 AM  Dressing Type Compression wrap Compression wrap  Dressing Clean, Dry, Intact Clean, Dry, Intact  Site / Wound Assessment Dressing in place / Unable to assess Clean;Dry  Drainage Amount None --     No associated orders.    Discharge Exam:   Vitals:   06/25/22  1519 06/25/22 1944 06/26/22 0408 06/26/22 0739  BP: 136/72 128/65 136/62 121/66  Pulse: 85 89 79 82  Resp: '18 17 18   '$ Temp: 98 F (36.7 C) 98 F (36.7 C) 97.9 F (36.6 C) 97.6 F (36.4 C)  TempSrc: Oral   Oral  SpO2: 100% 99% 97% 99%  Weight:      Height:        Body mass index is 40.98 kg/m.  General exam: Pleasant, elderly.  Pain controlled. Legs are weak. Skin: No rashes, lesions or ulcers. HEENT: Atraumatic, normocephalic, no obvious bleeding Lungs: Clear to auscultation bilaterally CVS: Regular rate and rhythm, no murmur GI/Abd soft, nontender, nondistended, bowel sound present CNS: Alert, awake, oriented x 3 Psychiatry: Mood appropriate Extremities: Chronically big legs which she does not want believe any edema.  Left upper and lower extremity braced  Follow ups:    Follow-up Information     Elmo Putt S, DO Follow up.   Specialty: Family Medicine Contact information: [address], [place] [address] [phone]                 Discharge Instructions:   Discharge Instructions     Call MD for:  difficulty breathing, headache or visual disturbances   Complete by: As directed    Call MD for:  extreme fatigue   Complete by: As directed    Call MD for:  hives   Complete by: As directed    Call MD for:  persistant dizziness or light-headedness   Complete by: As directed    Call MD for:  persistant nausea and vomiting   Complete by: As directed    Call MD for:  severe uncontrolled pain   Complete by: As directed    Call MD for:  temperature >100.4   Complete by: As directed    Diet general   Complete by: As directed    Discharge instructions   Complete by: As directed    General discharge instructions: Follow with Primary MD Megan Loffler, DO in 7 days  Please request your PCP  to go over your hospital tests, procedures, radiology results at the follow up. Please get your medicines reviewed and adjusted.  Your PCP may  decide to repeat certain labs or tests as needed. Do not drive, operate heavy machinery, perform activities at heights, swimming or participation in water activities or provide baby sitting services if your were admitted for syncope or siezures until you have seen by Primary MD or a Neurologist and advised to do so again. [place] Controlled Substance Reporting System database was reviewed. Do not drive, operate heavy machinery, perform activities at heights, swim, participate in water activities or provide baby-sitting services while on medications for pain, sleep and mood until your outpatient physician has reevaluated you and advised to do so again.  You are strongly recommended to comply with the dose, frequency and duration of prescribed medications. Activity: As tolerated with Full fall precautions use walker/cane & assistance as needed Avoid using any recreational substances like cigarette, tobacco, alcohol, or non-prescribed drug. If  you experience worsening of your admission symptoms, develop shortness of breath, life threatening emergency, suicidal or homicidal thoughts you must seek medical attention immediately by calling 911 or calling your MD immediately  if symptoms less severe. You must read complete instructions/literature along with all the possible adverse reactions/side effects for all the medicines you take and that have been prescribed to you. Take any new medicine only after you have completely understood and accepted all the possible adverse reactions/side effects.  Wear Seat belts while driving. You were cared for by a hospitalist during your hospital stay. If you have any questions about your discharge medications or the care you received while you were in the hospital after you are discharged, you can call the unit and ask to speak with the hospitalist or the covering physician. Once you are discharged, your primary care physician will handle any further medical issues.  Please note that NO REFILLS for any discharge medications will be authorized once you are discharged, as it is imperative that you return to your primary care physician (or establish a relationship with a primary care physician if you do not have one).   Increase activity slowly   Complete by: As directed        Discharge Medications:   Allergies as of 06/26/2022   No Known Allergies      Medication List     STOP taking these medications    nitrofurantoin 100 MG capsule Commonly known as: MACRODANTIN       TAKE these medications    apixaban 2.5 MG Tabs tablet Commonly known as: ELIQUIS Take 1 tablet (2.5 mg total) by mouth 2 (two) times daily.   calamine lotion Apply 1 Application topically 2 (two) times daily.   calcium citrate 950 (200 Ca) MG tablet Commonly known as: CALCITRATE - dosed in mg elemental calcium Take 1 tablet by mouth daily.   cetirizine 10 MG tablet Commonly known as: ZYRTEC Take 1 tablet (10 mg total) by mouth daily.   cholecalciferol 1000 units tablet Commonly known as: VITAMIN D Take 1,000 Units by mouth daily.   docusate sodium 100 MG capsule Commonly known as: COLACE Take 1 capsule (100 mg total) by mouth 2 (two) times daily.   fluticasone 50 MCG/ACT nasal spray Commonly known as: FLONASE Place 2 sprays into both nostrils daily.   HYDROcodone-acetaminophen 5-325 MG tablet Commonly known as: NORCO/VICODIN Take 1-2 tablets by mouth every 4 (four) hours as needed for moderate pain (pain score 4-6).   melatonin 3 MG Tabs tablet Take 1 tablet (3 mg total) by mouth at bedtime.   methocarbamol 500 MG tablet Commonly known as: ROBAXIN Take 1 tablet (500 mg total) by mouth every 6 (six) hours as needed for muscle spasms.   polyethylene glycol 17 g packet Commonly known as: MIRALAX / GLYCOLAX Take 17 g by mouth daily as needed for mild constipation.   senna-docusate 8.6-50 MG tablet Commonly known as: Senokot-S Take 1 tablet by mouth  at bedtime.   Vitamin D (Ergocalciferol) 1.25 MG (50000 UNIT) Caps capsule Commonly known as: DRISDOL Take 1 capsule (50,000 Units total) by mouth every Monday. Start taking on: July 03, 2022         The results of significant diagnostics from this hospitalization (including imaging, microbiology, ancillary and laboratory) are listed below for reference.    Procedures and Diagnostic Studies:   Chest Portable 1 View  Result Date: 06/15/2022 CLINICAL DATA:  Fall EXAM: PORTABLE CHEST 1 VIEW COMPARISON:  09/18/2003  FINDINGS: Heart size within normal limits. Aortic atherosclerosis. Coarsened interstitial markings bilaterally, slightly more pronounced on the right. No airspace consolidation. No pleural effusion or pneumothorax. No acute bony findings. IMPRESSION: Coarsened interstitial markings bilaterally, slightly more pronounced on the right. Findings may reflect chronic bronchitic lung changes versus mild edema or atypical/viral infection. Electronically Signed   By: Davina Poke D.O.   On: 06/15/2022 09:36   CT KNEE LEFT WO CONTRAST  Result Date: 06/15/2022 CLINICAL DATA:  Tibial plateau fracture. EXAM: CT OF THE LEFT KNEE WITHOUT CONTRAST TECHNIQUE: Multidetector CT imaging of the left knee was performed according to the standard protocol. Multiplanar CT image reconstructions were also generated. RADIATION DOSE REDUCTION: This exam was performed according to the departmental dose-optimization program which includes automated exposure control, adjustment of the mA and/or kV according to patient size and/or use of iterative reconstruction technique. COMPARISON:  Radiographs 06/14/2022 FINDINGS: Severely comminuted and depressed fracture of the tibial plateau this is mainly a die punch type fracture involving the central aspect of the tibia with associated splitting of the medial and lateral condyles. The medial portion of the lateral tibial plateau has a trap door appearance and is depressed  approximately 14 mm. The lateral half of the lateral tibial plateau is displaced laterally and is not articulating with the lateral femoral condyle. Displacement estimated at 2 cm. The fracture extends out through the medial cortex of the tibia near the metadiaphyseal junction. There is a comminuted fracture of the proximal fibular shaft with 1/2 shaft width of ventral displacement. Moderate-sized butterfly fragment. The femur and patella are intact. Lipo hemarthrosis is noted. Ligaments are difficult to assess on CT. High suspicion for lateral collateral ligament disruption. IMPRESSION: 1. Severely comminuted and depressed fracture of the tibial plateau as described above. 2. Comminuted fracture of the proximal fibular shaft. 3. High suspicion for fibular collateral ligament disruption. Electronically Signed   By: Marijo Sanes M.D.   On: 06/15/2022 06:34   DG Tibia/Fibula Left  Result Date: 06/14/2022 CLINICAL DATA:  Pain and limited range of motion after a fall. EXAM: LEFT TIBIA AND FIBULA - 2 VIEW COMPARISON:  None Available. FINDINGS: Comminuted and displaced fractures are demonstrated involving the proximal tibial metaphysis with fracture lines involving the central metaphysis and extending to the central aspects of the medial and lateral tibial plateaus at the base of the tibial spines. There is complete lateral displacement of the lateral tibial plateau with respect to the distal femur. There is a comminuted mostly transverse fracture of the proximal fibular shaft with mild lateral displacement and anterior displacement of the distal fracture fragment. There is associated soft tissue swelling and a large left knee effusion. Old internal fixation and degenerative changes are demonstrated in the ankle joint. IMPRESSION: 1. Comminuted and displaced fractures of the proximal tibial metaphysis with involvement of the medial and lateral tibial plateau and base of the tibial spines. Lateral dislocation of the  lateral tibial plateau with respect to the femur. 2. Mostly transverse comminuted and mildly displaced fractures of the proximal fibular shaft. Electronically Signed   By: Lucienne Capers M.D.   On: 06/14/2022 20:20   DG FEMUR MIN 2 VIEWS LEFT  Result Date: 06/14/2022 CLINICAL DATA:  Pain and limited range of motion after a fall. EXAM: LEFT FEMUR 2 VIEWS COMPARISON:  None Available. FINDINGS: Multiple images of the left femur excluding the left knee. Visualized left hip and proximal femur appear intact. No evidence of dislocation at the hip joint. No acute fractures  are demonstrated in the visualized portion. Soft tissues are unremarkable. IMPRESSION: No acute bony abnormalities demonstrated in the left hip and proximal femur. Electronically Signed   By: Lucienne Capers M.D.   On: 06/14/2022 20:15   DG Wrist Complete Left  Result Date: 06/14/2022 CLINICAL DATA:  Pain and limited range of motion after a fall. EXAM: LEFT WRIST - COMPLETE 3+ VIEW COMPARISON:  None Available. FINDINGS: Comminuted and impacted fractures of the distal left radius with fracture lines extending to the radiocarpal and radioulnar joint. Nondisplaced fracture of the ulnar styloid process. Degenerative changes in the STT and first carpometacarpal joints as well as in the visualized interphalangeal joints. Severe dorsal soft tissue swelling over the wrist and distal forearm. IMPRESSION: Comminuted and impacted fractures of the distal left radius with nondisplaced fracture of the ulnar styloid process. Electronically Signed   By: Lucienne Capers M.D.   On: 06/14/2022 20:14     Labs:   Basic Metabolic Panel: Recent Labs  Lab 06/21/22 0434 06/24/22 0731 06/26/22 0701  NA  --  138 139  K  --  3.1* 3.9  CL  --  103 106  CO2  --  28 24  GLUCOSE  --  104* 107*  BUN  --  14 12  CREATININE 0.56 0.51 0.66  CALCIUM  --  8.6* 8.7*  MG  --  2.1 2.2   GFR Estimated Creatinine Clearance: 75.7 mL/min (by C-G formula based on  SCr of 0.66 mg/dL). Liver Function Tests: Recent Labs  Lab 06/24/22 0731 06/26/22 0701  AST 30 20  ALT 30 24  ALKPHOS 88 94  BILITOT 2.3* 1.6*  PROT 5.9* 5.8*  ALBUMIN 2.8* 2.7*   No results for input(s): "LIPASE", "AMYLASE" in the last 168 hours. No results for input(s): "AMMONIA" in the last 168 hours. Coagulation profile No results for input(s): "INR", "PROTIME" in the last 168 hours.  CBC: Recent Labs  Lab 06/20/22 0131 06/24/22 0731 06/26/22 0701  WBC 7.2 6.9 6.5  NEUTROABS 4.6  --   --   HGB 9.6* 10.8* 10.6*  HCT 29.5* 32.6* 33.5*  MCV 87.8 86.0 88.2  PLT 244 369 381   Cardiac Enzymes: Recent Labs  Lab 06/20/22 0131  CKTOTAL 384*   BNP: Invalid input(s): "POCBNP" CBG: No results for input(s): "GLUCAP" in the last 168 hours. D-Dimer No results for input(s): "DDIMER" in the last 72 hours. Hgb A1c No results for input(s): "HGBA1C" in the last 72 hours. Lipid Profile No results for input(s): "CHOL", "HDL", "LDLCALC", "TRIG", "CHOLHDL", "LDLDIRECT" in the last 72 hours. Thyroid function studies No results for input(s): "TSH", "T4TOTAL", "T3FREE", "THYROIDAB" in the last 72 hours.  Invalid input(s): "FREET3" Anemia work up No results for input(s): "VITAMINB12", "FOLATE", "FERRITIN", "TIBC", "IRON", "RETICCTPCT" in the last 72 hours. Microbiology No results found for this or any previous visit (from the past 240 hour(s)).  Time coordinating discharge: 35 minutes  Signed: Makailee Nudelman  [facility] 06/26/2022, 11:20 AM

## 2022-06-26 NOTE — H&P (Addendum)
Physical Medicine and Rehabilitation Admission H&P    Chief Complaint  Patient presents with   Functional deficits due to fall with multiple Fx    HPI:  Megan Walter. Keightley is a 77 year old female with history of osteoporosis, obesity- BMI 40.9 who was admitted on 06/15/22 after slipping and falling in her garage with onset of left wrist and left knee pain.  She was found to have left tibial plateau fracture, left distal radius fracture with non displaced fracture of ulnar styloid process.  Denies head trauma or LOC. She was evaluated by Dr. Doreatha Martin and underwent ORIF left tibial plateau and ORIF left distal radius on 06/16/22. Post op to be MWB LLE and NWB left wrist--can WB thru elbow. She was started on lovenox and transitioned to low dose eliquis for DVT prophylaxis.  She was started on vitamin D for vitamin D deficiency.  She is recommended to be on DVT prophylaxis with Eliquis 2.5 mg twice daily for 30 days at discharge.  Patient reports her pain is under control with current medications.  Patient was noted to have chronic bilateral lower extremity lymphedema, she has not noted to have a history of CHF.  She was also diagnosed with rhabdomyolysis and this improved with IV hydration.  She reports chronic altered sensation in her right lower extremity from a prior surgery.    Review of Systems  Constitutional:  Negative for chills and fever.  HENT:  Negative for hearing loss.   Eyes:  Negative for blurred vision.  Respiratory:  Negative for cough and shortness of breath.   Cardiovascular:  Negative for chest pain and palpitations.  Gastrointestinal:  Negative for constipation, heartburn and nausea.  Genitourinary:  Negative for dysuria.       Dibbles occasionally   Musculoskeletal:  Positive for joint pain. Negative for myalgias.  Skin:  Negative for rash.  Neurological:  Positive for sensory change and weakness. Negative for dizziness.  Psychiatric/Behavioral:  The patient is  nervous/anxious (at nights--bed is confining) and has insomnia.      No past medical history on file.   Past Surgical History:  Procedure Laterality Date   ANKLE FRACTURE SURGERY Left    hardware   CATARACT EXTRACTION Right    ORIF PROXIMAL TIBIAL PLATEAU FRACTURE Right    hardware   ORIF TIBIA PLATEAU Left 06/16/2022   Procedure: OPEN REDUCTION INTERNAL FIXATION (ORIF) TIBIAL PLATEAU;  Surgeon: Shona Needles, MD;  Location: [facility];  Service: Orthopedics;  Laterality: Left;   ORIF WRIST FRACTURE Left 06/16/2022   Procedure: OPEN REDUCTION INTERNAL FIXATION (ORIF) WRIST FRACTURE;  Surgeon: Shona Needles, MD;  Location: [facility];  Service: Orthopedics;  Laterality: Left;   WRIST FRACTURE SURGERY Right    hardware    Family History  Problem Relation Age of Onset   Hypertension Mother    Heart attack Mother     Social History:  Married. Husband is an amputee and works (transports car). Home maker.  reports that she has never smoked. She does not have any smokeless tobacco history on file. She reports that she does not drink alcohol and does not use drugs.   Allergies: No Known Allergies   Medications Prior to Admission  Medication Sig Dispense Refill   apixaban (ELIQUIS) 2.5 MG TABS tablet Take 1 tablet (2.5 mg total) by mouth 2 (two) times daily. 60 tablet    calamine lotion Apply 1 Application topically 2 (two) times daily.  0   calcium  citrate (CALCITRATE - DOSED IN MG ELEMENTAL CALCIUM) 950 MG tablet Take 1 tablet by mouth daily.     cetirizine (ZYRTEC) 10 MG tablet Take 1 tablet (10 mg total) by mouth daily. 30 tablet 3   cholecalciferol (VITAMIN D) 1000 UNITS tablet Take 1,000 Units by mouth daily.     docusate sodium (COLACE) 100 MG capsule Take 1 capsule (100 mg total) by mouth 2 (two) times daily. 10 capsule 0   fluticasone (FLONASE) 50 MCG/ACT nasal spray Place 2 sprays into both nostrils daily. 16 g 6   HYDROcodone-acetaminophen (NORCO/VICODIN) 5-325 MG tablet Take 1-2  tablets by mouth every 4 (four) hours as needed for moderate pain (pain score 4-6). 30 tablet 0   melatonin 3 MG TABS tablet Take 1 tablet (3 mg total) by mouth at bedtime.  0   methocarbamol (ROBAXIN) 500 MG tablet Take 1 tablet (500 mg total) by mouth every 6 (six) hours as needed for muscle spasms.     polyethylene glycol (MIRALAX / GLYCOLAX) 17 g packet Take 17 g by mouth daily as needed for mild constipation. 14 each 0   senna-docusate (SENOKOT-S) 8.6-50 MG tablet Take 1 tablet by mouth at bedtime.     [START ON 07/03/2022] Vitamin D, Ergocalciferol, (DRISDOL) 1.25 MG (50000 UNIT) CAPS capsule Take 1 capsule (50,000 Units total) by mouth every Monday. 5 capsule     Home: Home Living Family/patient expects to be discharged to:: Private residence Living Arrangements: Spouse/significant other Available Help at Discharge: Family, Available 24 hours/day Type of Home: House Home Access: Stairs to enter ((getting a ramp)) Entrance Stairs-Number of Steps: 2 Entrance Stairs-Rails: Right, Left Home Layout: One level Bathroom Shower/Tub: Tub/shower unit, Architectural technologist: Handicapped height Bathroom Accessibility: Yes Home Equipment: Radio producer - single point  Lives With: Spouse   Functional History: Prior Function Prior Level of Function : Independent/Modified Independent, History of Falls (last six months), Driving Mobility Comments: ind, only this fall reported. ADLs Comments: ind   Functional Status:  Mobility: Bed Mobility Overal bed mobility: Needs Assistance Bed Mobility: Supine to Sit Supine to sit: Min assist Sit to supine: Min assist General bed mobility comments: set up with belt to help move the LLE. Transfers Overall transfer level: Needs assistance Equipment used: Left platform walker Transfers: Sit to/from Stand, Bed to chair/wheelchair/BSC Sit to Stand: Min assist, +2 safety/equipment Bed to/from chair/wheelchair/BSC transfer type:: Stand pivot Stand pivot  transfers: Min assist, +2 safety/equipment  Lateral/Scoot Transfers: Mod assist, +2 physical assistance General transfer comment: Took slow, heel-toe pivot steps on RLE; Good maintenance of LLE NWB using therapist foot to gauge compliance. Ambulation/Gait General Gait Details: unable pt is unable to clear her R foot from the floro to progress gait.   ADL: ADL Overall ADL's : Needs assistance/impaired Eating/Feeding: Minimal assistance, Sitting Eating/Feeding Details (indicate cue type and reason): Min A for bilateral tasks Grooming: Minimal assistance, Sitting Grooming Details (indicate cue type and reason): Min A for bilateral tasks Upper Body Bathing: Moderate assistance, Sitting Lower Body Bathing: Maximal assistance, +2 for physical assistance, Sit to/from stand Upper Body Dressing : Minimal assistance Lower Body Dressing: Maximal assistance, Sit to/from stand Toilet Transfer: Minimal assistance, Stand-pivot, BSC/3in1 Toilet Transfer Details (indicate cue type and reason): LPFRW simulated eob to recliner going to the left with some scoot/hop steps Toileting- Clothing Manipulation and Hygiene: Maximal assistance Toileting - Clothing Manipulation Details (indicate cue type and reason): min A sit<>stand Functional mobility during ADLs: Moderate assistance, Cueing for safety, Cueing for sequencing  General ADL Comments: focused on HEP for increasing strength for safety with transfers and adls.  reports using bsc consistently except at night, getting up and transferring more   Cognition: Cognition Overall Cognitive Status: Within Functional Limits for tasks assessed Orientation Level: Oriented X4 Cognition Arousal/Alertness: Awake/alert Behavior During Therapy: WFL for tasks assessed/performed Overall Cognitive Status: Within Functional Limits for tasks assessed      Blood pressure 121/66, pulse 82, temperature 97.6 F (36.4 C), temperature source Oral, resp. rate 18, height '5\' 5"'$   (1.651 m), weight 111.7 kg, SpO2 99 %.     General: No apparent distress, sitting in bedside chair, obese HEENT: Head is normocephalic, atraumatic, PERRLA, EOMI, sclera anicteric, oral mucosa pink and moist, dentition intact, ext ear canals clear,  Neck: Supple without JVD or lymphadenopathy Heart: Reg rate and rhythm. No murmurs rubs or gallops Chest: CTA bilaterally without wheezes, rales, or rhonchi; no distress Abdomen: Soft, non-tender, non-distended, bowel sounds positive. Extremities: No clubbing, cyanosis, bilateral lower extremity edema present Ecchymosis left fingers Psych: Pt's affect is appropriate. Pt is cooperative Skin: Splint left wrist--NV intact. Left knee and shin with incisions - C/D/I , please see image Neuro: Alert and oriented x 4, follows commands, cranial nerves II through XII intact, normal speech and language Strength 5 out of 5 in right upper extremity and right lower extremity Strength 5 out of 5 left shoulder abduction, at least 4 out of 5 elbow flexion extension, 3 out of 5 finger flexion and extension Strength 2-3 out of 5 proximal left lower extremity, strength at least 3 out of 5 distal left lower extremity MMT-limited by weightbearing restrictions Sensation to light touch intact in all 4 extremities however sensation decreased along right anterior shin-patient reports this is from a prior surgery Musculoskeletal: Appropriately tender left lower extremity Left lower extremity pain with movement at the hip and knee  IV right upper extremity looks okay    Results for orders placed or performed during the hospital encounter of 06/14/22 (from the past 48 hour(s))  CBC     Status: Abnormal   Collection Time: 06/26/22  7:01 AM  Result Value Ref Range   WBC 6.5 4.0 - 10.5 K/uL   RBC 3.80 (L) 3.87 - 5.11 MIL/uL   Hemoglobin 10.6 (L) 12.0 - 15.0 g/dL   HCT 33.5 (L) 36.0 - 46.0 %   MCV 88.2 80.0 - 100.0 fL   MCH 27.9 26.0 - 34.0 pg   MCHC 31.6 30.0 - 36.0  g/dL   RDW 13.2 11.5 - 15.5 %   Platelets 381 150 - 400 K/uL   nRBC 0.0 0.0 - 0.2 %    Comment: Performed at [facility], [address] [place], [place] [address]  Comprehensive metabolic panel     Status: Abnormal   Collection Time: 06/26/22  7:01 AM  Result Value Ref Range   Sodium 139 135 - 145 mmol/L   Potassium 3.9 3.5 - 5.1 mmol/L   Chloride 106 98 - 111 mmol/L   CO2 24 22 - 32 mmol/L   Glucose, Bld 107 (H) 70 - 99 mg/dL    Comment: Glucose reference range applies only to samples taken after fasting for at least 8 hours.   BUN 12 8 - 23 mg/dL   Creatinine, Ser 0.66 0.44 - 1.00 mg/dL   Calcium 8.7 (L) 8.9 - 10.3 mg/dL   Total Protein 5.8 (L) 6.5 - 8.1 g/dL   Albumin 2.7 (L) 3.5 - 5.0 g/dL  AST 20 15 - 41 U/L   ALT 24 0 - 44 U/L   Alkaline Phosphatase 94 38 - 126 U/L   Total Bilirubin 1.6 (H) 0.3 - 1.2 mg/dL   GFR, Estimated >60 >60 mL/min    Comment: (NOTE) Calculated using the CKD-EPI Creatinine Equation (2021)    Anion gap 9 5 - 15    Comment: Performed at [facility] [address] [place], [place] [address]  Magnesium     Status: None   Collection Time: 06/26/22  7:01 AM  Result Value Ref Range   Magnesium 2.2 1.7 - 2.4 mg/dL    Comment: Performed at [facility] [address] [place], [place] [address]   No results found.    Blood pressure 121/66, pulse 82, temperature 97.6 F (36.4 C), temperature source Oral, resp. rate 18, height '5\' 5"'$  (1.651 m), weight 111.7 kg, SpO2 99 %.   Medical Problem List and Plan: 1. Functional deficits secondary to trauma after a fall with left distal radius fracture, ulnar styloid process fracture,  left proximal fibular shaft fracture and left tibial plateau fracture  -patient may not shower  -ELOS/Goals: 10 to 14 days PT/OT min a to supervision  -Admit to CIR, PT OT  2.  Antithrombotics: -DVT/anticoagulation:  Pharmaceutical: Eliquis 0.5 mg twice daily for 30 days  -antiplatelet therapy: N/A 3.  Pain Management:  Robaxin /Hydrocodone prn.  4. Mood/Behavior/Sleep: LCSW to follow for evaluation and support.   -antipsychotic agents: N/A.  5. Neuropsych/cognition: This patient is capable of making decisions on her own behalf. 6. Skin/Wound Care: Routine pressure relief measures. Check lytes in am.  7. Fluids/Electrolytes/Nutrition: Monitor I/O. Check CMET in am.  8. Left tibial plateau Fx s/p ORIF: NWB LLE 9. Distal radius/ulna styloid Fx: NWB wrist and WBAT thru left wrist 10. Vitamin D deficiency: Vit D-17.18 and started on ergocalciferol on 02/26 11. ABLA: HGB 10.6 06/26/22. Recheck in am--improving.  12. Low protein stores: Alb-2.7. Will add protein supplements.  13. Obesity. BMI 40.98. Dietary counseling.  14.  Rhabdomyolysis improved with IV hydration 15.  Elevated bilirubin.  Appears to be improving.  Recheck in the morning   I have personally performed a face to face diagnostic evaluation of this patient and formulated the key components of the plan.  Additionally, I have personally reviewed laboratory data, imaging studies, as well as relevant notes and concur with the physician assistant's documentation above.  The patient's status has not changed from the original H&P.  Any changes in documentation from the acute care chart have been noted above.  Jennye Boroughs, MD, [facility]rown   Bary Leriche, PA-C 06/26/2022

## 2022-06-26 NOTE — Progress Notes (Signed)
Inpatient Rehabilitation Admission Medication Review by a Pharmacist  A complete drug regimen review was completed for this patient to identify any potential clinically significant medication issues.  High Risk Drug Classes Is patient taking? Indication by Medication  Antipsychotic Yes Compazine- N/V  Anticoagulant Yes Apixaban- vte ppx s/p ortho surgery to end 07/21/2022  Antibiotic No   Opioid Yes Norco- post-op acute pain  Antiplatelet No   Hypoglycemics/insulin No   Vasoactive Medication No   Chemotherapy No   Other Yes Melatonin- sleep Claritin- seasonal allergies Flonase seasonal rhinitis Robaxin- muscle spasms Vitamin D- vitamin D deficiency     Type of Medication Issue Identified Description of Issue Recommendation(s)  Drug Interaction(s) (clinically significant)     Duplicate Therapy     Allergy     No Medication Administration End Date     Incorrect Dose     Additional Drug Therapy Needed     Significant med changes from prior encounter (inform family/care partners about these prior to discharge).    Other  PTA meds: Calcium w/ vit D  Zyrtec Vitamin D macrobid Restart PTA meds when and if necessary during CIR admission or at time of discharge, if warranted    Clinically significant medication issues were identified that warrant physician communication and completion of prescribed/recommended actions by midnight of the next day:  No   Time spent performing this drug regimen review (minutes):  30   Earle Reome BS, PharmD, BCPS Clinical Pharmacist 06/26/2022 5:45 PM  Contact: [phone] after 3 PM  "Be curious, not judgmental..." -Jamal Maes

## 2022-06-26 NOTE — Progress Notes (Signed)
PMR Admission Coordinator Pre-Admission Assessment   Patient: Megan Walter is an 77 y.o., female MRN: [id number] DOB: 01/12/1946 Height: '5\' 5"'$  (165.1 cm) Weight: 124.1 kg                                                                                                                                                  Insurance Information HMO:  yes    PPO:      PCP:      IPA:      80/20:      OTHER:  PRIMARY: Blue Medicare Enhanced       Policy#: [id number]      Subscriber: pt CM Name: shannon      Phone#: [phone]     Fax#: [phone] Pre-Cert#: [id number] Employer:  Raquel at [employer] called with appeal overturn stating Pt. Approved for admit 3/4-07/01/22.  Benefits:  Phone #: Myra at [phone] Eff Date: 04/24/2022 - still active Deductible: does not have one OOP Max: $3,150 ($0 met) CIR: $335/day co-pay for days 1-5 SNF: $0.00 Copayment per day for days 1-20; $203 Copayment per day for days 21-60; $0.00 copayment for days 61-100 Maximum of 100 days/benefit period Outpatient:  $10 copay/visit Home Health:  100% coverage DME: 80% coverage, 20% co-insurance Providers: in network   SECONDARY:       Policy#:       Phone#:    Development worker, community:       Phone#:    The Engineer, petroleum" for patients in Inpatient Rehabilitation Facilities with attached "Privacy Act [place] Records" was provided and verbally reviewed with: Patient   Emergency Contact Information Contact Information       Name Relation Home Work Mobile    [place] Spouse [phone]        [facility] Daughter   [phone]    [place] Daughter     [phone]         Current Medical History  Patient Admitting Diagnosis: L tibia fracture, L wrist fracture  History of Present Illness: Megan Walter is a morbidly obese 77 y.o. female with a past medical history significant for osteoporosis.  She fell on 2/221/2024 and was taken to the emergency  department with immediate onset left arm and left leg pain.  .  X-rays of the left leg demonstrated acute comminuted and displaced fractures of the proximal tibial metaphysis and transverse comminuted and mildly displaced fractures of the proximal fibular shaft.  X-rays of the wrist demonstrated comminuted impacted fractures of the distal left radius with nondisplaced fracture of the left ulnar styloid process.  Orthopedics was consulted and the patient underwent ORIF of the left wrist fracture and ORIF of the left tibial plateau fracture on 06/16/2022.  There were no postoperative anesthetic complications.CAGE screening was negative Initial Occupational Therapy evaluation was performed on 06/17/2022.  Previous  functional status was independent.  Current functional status postoperative mod assist for upper body ADLs max assist lower body ADLs mod assist +2 for functional transfers.  Sit to stand with mod assist x 2 Restrictions include nonweightbearing status left lower limb, left upper extremity weightbearing through elbow only. Recommended DVT prophylaxis Eliquis 2.5 mg twice daily for 30 days, cuurently on lovenox .  Patient also noted to have vitamin D deficiency and vitamin D 50,000 units weekly x 8 weeks was ordered.  Patient also had rhabdomyolysis with CK up to 1109 on 06/18/2022.  Kidney functions however were normal.  Initial hemoglobin was 11.2 on 06/16/2022 which dropped to 9.6 postoperatively on 06/19/2022 but no transfusion was required Glasgow Coma Scale Score: 15   Patient's medical record from [facility]  has been reviewed by the rehabilitation admission coordinator and physician.   Past Medical History  History reviewed. No pertinent past medical history.   Has the patient had major surgery during 100 days prior to admission? Yes   Family History  family history includes Heart attack in her mother; Hypertension in her mother.     Current Medications    Current  Facility-Administered Medications:    0.9 %  sodium chloride infusion, , Intravenous, Continuous, Dahal, Binaya, MD, Last Rate: 75 mL/hr at 06/18/22 0957, Rate Change at 06/18/22 0957   acetaminophen (TYLENOL) tablet 325-650 mg, 325-650 mg, Oral, Q6H PRN, Corinne Ports, PA-C   docusate sodium (COLACE) capsule 100 mg, 100 mg, Oral, BID, Corinne Ports, PA-C, 100 mg at 06/17/22 0824   enoxaparin (LOVENOX) injection 60 mg, 60 mg, Subcutaneous, Q24H, Wendee Beavers, RPH, 60 mg at 06/19/22 0900   fluticasone (FLONASE) 50 MCG/ACT nasal spray 2 spray, 2 spray, Each Nare, Daily PRN, Corinne Ports, PA-C   HYDROcodone-acetaminophen (NORCO) 7.5-325 MG per tablet 1-2 tablet, 1-2 tablet, Oral, Q4H PRN, Corinne Ports, PA-C, 2 tablet at 06/17/22 1358   HYDROcodone-acetaminophen (NORCO/VICODIN) 5-325 MG per tablet 1-2 tablet, 1-2 tablet, Oral, Q4H PRN, Corinne Ports, PA-C, 2 tablet at 06/17/22 0220   loratadine (CLARITIN) tablet 10 mg, 10 mg, Oral, Daily PRN, Corinne Ports, PA-C   melatonin tablet 3 mg, 3 mg, Oral, QHS, McClung, Sarah A, PA-C, 3 mg at 06/18/22 2153   methocarbamol (ROBAXIN) tablet 500 mg, 500 mg, Oral, Q6H PRN, 500 mg at 06/18/22 2153 **OR** methocarbamol (ROBAXIN) 500 mg in dextrose 5 % 50 mL IVPB, 500 mg, Intravenous, Q6H PRN, McClung, Sarah A, PA-C   metoCLOPramide (REGLAN) tablet 5-10 mg, 5-10 mg, Oral, Q8H PRN **OR** metoCLOPramide (REGLAN) injection 5-10 mg, 5-10 mg, Intravenous, Q8H PRN, McClung, Sarah A, PA-C   morphine (PF) 2 MG/ML injection 0.5-1 mg, 0.5-1 mg, Intravenous, Q2H PRN, Corinne Ports, PA-C, 1 mg at 06/16/22 2359   ondansetron (ZOFRAN) injection 4 mg, 4 mg, Intravenous, Q6H PRN, Corinne Ports, PA-C, 4 mg at 06/17/22 1642   polyethylene glycol (MIRALAX / GLYCOLAX) packet 17 g, 17 g, Oral, Daily PRN, McClung, Sarah A, PA-C   senna-docusate (Senokot-S) tablet 1 tablet, 1 tablet, Oral, QHS, Corinne Ports, PA-C, 1 tablet at 06/16/22 2155   Vitamin D  (Ergocalciferol) (DRISDOL) 1.25 MG (50000 UNIT) capsule 50,000 Units, 50,000 Units, Oral, Q Hollie [place], 50,000 Units at 06/19/22 [id number]   Patients Current Diet:  Diet Order                  Diet regular Room service appropriate? Yes; Fluid  consistency: Thin  Diet effective now                         Precautions / Restrictions Precautions Precautions: Fall Restrictions Weight Bearing Restrictions: Yes LUE Weight Bearing: Weight bear through elbow only LLE Weight Bearing: Non weight bearing    Has the patient had 2 or more falls or a fall with injury in the past year?Yes   Prior Activity Level Community (5-7x/wk): Pt. active in the community PTA   Prior Functional Level Prior Function Prior Level of Function : Independent/Modified Independent, History of Falls (last six months), Driving Mobility Comments: ind, only this fall reported. ADLs Comments: ind   Self Care: Did the patient need help bathing, dressing, using the toilet or eating?  Independent   Indoor Mobility: Did the patient need assistance with walking from room to room (with or without device)? Independent   Stairs: Did the patient need assistance with internal or external stairs (with or without device)? Independent   Functional Cognition: Did the patient need help planning regular tasks such as shopping or remembering to take medications? Independent   Patient Information Are you of Hispanic, Latino/a,or Spanish origin?: A. No, not of Hispanic, Latino/a, or Spanish origin What is your race?: A. White Do you need or want an interpreter to communicate with a doctor or health care staff?: 0. No   Patient's Response To:  Health Literacy and Transportation Is the patient able to respond to health literacy and transportation needs?: Yes Health Literacy - How often do you need to have someone help you when you read instructions, pamphlets, or other written material from your doctor or pharmacy?:  Never In the past 12 months, has lack of transportation kept you from medical appointments or from getting medications?: No In the past 12 months, has lack of transportation kept you from meetings, work, or from getting things needed for daily living?: No   Home Assistive Devices / [place] Devices/Equipment: Radio producer (specify quad or straight) Home Equipment: Cane - single point   Prior Device Use: Indicate devices/aids used by the patient prior to current illness, exacerbation or injury? None of the above   Current Functional Level Cognition   Overall Cognitive Status: Within Functional Limits for tasks assessed Orientation Level: Oriented X4    Extremity Assessment (includes Sensation/Coordination)   Upper Extremity Assessment: LUE deficits/detail LUE Deficits / Details: Full AROM for shoulder and elbow, partial AROM for supination/pronation due to splint, no AROM wrist due to splint, close to full AROM of digits LUE Sensation: decreased light touch LUE Coordination: decreased gross motor  Lower Extremity Assessment: LLE deficits/detail LLE Deficits / Details: Able to actively flex ankle, good sensation distally. LLE bandaged. LLE: Unable to fully assess due to pain (NWB)     ADLs   Overall ADL's : Needs assistance/impaired Eating/Feeding: Minimal assistance, Sitting Eating/Feeding Details (indicate cue type and reason): Min A for bilateral tasks Grooming: Minimal assistance, Sitting Grooming Details (indicate cue type and reason): Min A for bilateral tasks Upper Body Bathing: Moderate assistance, Sitting Lower Body Bathing: Maximal assistance, +2 for physical assistance, Sit to/from stand Upper Body Dressing : Moderate assistance, Sitting Lower Body Dressing: Maximal assistance, +2 for physical assistance, Sit to/from stand Toilet Transfer: Moderate assistance, +2 for physical assistance, Stand-pivot Toilet Transfer Details (indicate cue type and reason): L PFRW,  simulated be to recliner going to her right Toileting- Clothing Manipulation and Hygiene: Maximal assistance, Bed level Functional mobility  during ADLs: Moderate assistance, +2 for physical assistance General ADL Comments: Pt with decreased strength, balance, ROM, and activity tolerance. Despite pain, pt very motivated and demonstrating poor rehab potential.     Mobility   Overal bed mobility: Needs Assistance Bed Mobility: Sit to Supine Supine to sit: Min assist, HOB elevated Sit to supine: Mod assist General bed mobility comments: Mod A for LLE elevation to EOB and dense cues for use of gait belt for LLE management     Transfers   Overall transfer level: Needs assistance Equipment used: Left platform walker Transfers: Sit to/from Stand, Bed to chair/wheelchair/BSC Sit to Stand: Max assist, +2 physical assistance Bed to/from chair/wheelchair/BSC transfer type:: Stand pivot Stand pivot transfers: Mod assist, +2 physical assistance  Lateral/Scoot Transfers: Mod assist, +2 physical assistance General transfer comment: from recliner to platform walker with max A +2 for power up after multiple attempts. Dense cues for LLE NWB. Pt able to pivot on RLE with mod A +2 to EOB.     Ambulation / Gait / Stairs / Wheelchair Mobility   Ambulation/Gait General Gait Details: unable     Posture / Balance Dynamic Sitting Balance Sitting balance - Comments: at EOB and in recliner Balance Overall balance assessment: Needs assistance Sitting-balance support: No upper extremity supported, Feet supported Sitting balance-Leahy Scale: Good Sitting balance - Comments: at EOB and in recliner Standing balance support: Bilateral upper extremity supported, During functional activity, Reliant on assistive device for balance Standing balance-Leahy Scale: Poor Standing balance comment: with L platform RW support     none      Previous Home Environment (from acute therapy documentation) Living Arrangements:  Spouse/significant other  Lives With: Spouse Available Help at Discharge: Family, Available 24 hours/day Type of Home: House Home Layout: One level Home Access: Stairs to enter ((getting a ramp)) Entrance Stairs-Rails: Right, Left Entrance Stairs-Number of Steps: 2 Bathroom Shower/Tub: Tub/shower unit, Architectural technologist: Handicapped height Bathroom Accessibility: Yes Home Care Services: No   Discharge Living Setting Plans for Discharge Living Setting: Patient's home Type of Home at Discharge: House Discharge Home Layout: One level Discharge Home Access: Stairs to enter Entrance Stairs-Rails: Right, Left Entrance Stairs-Number of Steps: 2 Discharge Bathroom Shower/Tub: Tub/shower unit Discharge Bathroom Toilet: Handicapped height Discharge Bathroom Accessibility: Yes How Accessible: Accessible via walker, Accessible via wheelchair Does the patient have any problems obtaining your medications?: No   Social/Family/Support Systems Patient Roles: Spouse Contact Information: [phone] Anticipated Caregiver: [name] (husband) works but daughters live nearby and can come assist days Ability/Limitations of Caregiver: Min A Caregiver Availability: 24/7 Discharge Plan Discussed with Primary Caregiver: Yes Is Caregiver In Agreement with Plan?: Yes Does Caregiver/Family have Issues with Lodging/Transportation while Pt is in Rehab?: No     Goals Patient/Family Goal for Rehab: PT/OT Min A-Supervision Level Expected length of stay: 10-14 days Pt/Family Agrees to Admission and willing to participate: Yes Program Orientation Provided & Reviewed with Pt/Caregiver Including Roles  & Responsibilities: Yes     Decrease burden of Care through IP rehab admission: n/a     Possible need for SNF placement upon discharge:not anticipated     Patient Condition: This patient's medical and functional status has changed since the consult dated: 06/19/22 in which the Rehabilitation  Physician determined and documented that the patient's condition is appropriate for intensive rehabilitative care in an inpatient rehabilitation facility. See "History of Present Illness" (above) for medical update. Functional changes are: Pt. Now mod A. Patient's medical and functional status update has  been discussed with the Rehabilitation physician and patient remains appropriate for inpatient rehabilitation. Will admit to inpatient rehab today.   Preadmission Screen Completed By:  Genella Mech, CCC-SLP, 06/19/2022 3:10 PM ______________________________________________________________________   Discussed status with Dr. Curlene Dolphin on 06/26/22 at 63 and received approval for admission today.   Admission Coordinator:  Genella Mech, [id number] /Date3/4/24

## 2022-06-26 NOTE — Progress Notes (Signed)
Occupational Therapy Treatment Patient Details Name: Megan Walter MRN: [id number] DOB: [birth year] Today's Date: 06/26/2022   History of present illness 77 yo female presenting to ED on 2/22 after fall at home. Sustained L distal radius fx with nondisplaced fracture of the ulnar styloid process and L displaced proximal tibial metaphysis fx. S/p ORIF of L tibial fx and ORIF of L wrist fx on 2/23. PMH including osteoporosis and R tibial fx (2005).   OT comments  This 77 yo female is doing better overall with bed mobility, sit<>stand, but still struggling with surface to surface transfers with LPFRW as far as potentially maintaining the NWB on LLE. She is determined to work hard at this. She will continue to benefit from acute OT with followup on AIR to get to a Mod I level from W/C and perhaps RW level.   Recommendations for follow up therapy are one component of a multi-disciplinary discharge planning process, led by the attending physician.  Recommendations may be updated based on patient status, additional functional criteria and insurance authorization.    Follow Up Recommendations  Acute inpatient rehab (3hours/day)     Assistance Recommended at Discharge Frequent or constant Supervision/Assistance  Patient can return home with the following  A lot of help with walking and/or transfers;A lot of help with bathing/dressing/bathroom;Assistance with cooking/housework;Help with stairs or ramp for entrance;Assist for transportation   Equipment Recommendations  Other (comment) (TBD next venue (pt D/C'ing to AIR today))       Precautions / Restrictions Precautions Precautions: Fall Restrictions Weight Bearing Restrictions: Yes LUE Weight Bearing: Weight bear through elbow only LLE Weight Bearing: Non weight bearing       Mobility Bed Mobility Overal bed mobility: Needs Assistance Bed Mobility: Supine to Sit     Supine to sit: Min assist     General bed mobility comments: set up  with belt to help move the LLE.    Transfers Overall transfer level: Needs assistance Equipment used: Left platform walker Transfers: Sit to/from Stand, Bed to chair/wheelchair/BSC Sit to Stand: Min assist, +2 safety/equipment Stand pivot transfers: Min assist, +2 safety/equipment         General transfer comment: Took slow, heel-toe pivot steps on RLE; Good maintenance of LLE NWB using therapist foot to gauge compliance.     Balance Overall balance assessment: Needs assistance Sitting-balance support: No upper extremity supported, Feet supported Sitting balance-Leahy Scale: Good     Standing balance support: Bilateral upper extremity supported, During functional activity, Reliant on assistive device for balance Standing balance-Leahy Scale: Poor Standing balance comment: with L platform RW support pt attempted to perform perineal care standing at [facility] at Max A with very minimal toe WB on the L. When cued pt stated there was not weight through the L foot; there was extension through all toes and most likely 25% WB or less.                           ADL either performed or assessed with clinical judgement   ADL Overall ADL's : Needs assistance/impaired                         Toilet Transfer: Minimal assistance;Stand-pivot;BSC/3in1   Toileting- Clothing Manipulation and Hygiene: Maximal assistance Toileting - Clothing Manipulation Details (indicate cue type and reason): min A sit<>stand            Extremity/Trunk Assessment Upper Extremity Assessment Upper  Extremity Assessment: LUE deficits/detail LUE Deficits / Details: Full AROM for shoulder and elbow, partial AROM for supination/pronation due to splint, no AROM wrist due to splint, close to full AROM of digits LUE Coordination: decreased gross motor            Vision Patient Visual Report: No change from baseline            Cognition Arousal/Alertness: Awake/alert Behavior During  Therapy: WFL for tasks assessed/performed Overall Cognitive Status: Within Functional Limits for tasks assessed                                          Exercises Other Exercises Other Exercises: Educated pt on RUE level 2 theraband exercises she can do in recliner and bed. She returned demonstrated them. It was recommended to her that she do 5 reps of each one every hour she is awake--exercises posted in room for her to see.            Pertinent Vitals/ Pain       Pain Assessment Pain Assessment: 0-10 Faces Pain Scale: Hurts a little bit Pain Location: L calf Pain Descriptors / Indicators: Aching Pain Intervention(s): Monitored during session         Frequency  Min 2X/week        Progress Toward Goals  OT Goals(current goals can now be found in the care plan section)  Progress towards OT goals: Progressing toward goals  Acute Rehab OT Goals Patient Stated Goal: to go to AIR OT Goal Formulation: With patient Time For Goal Achievement: 07/01/22 Potential to Achieve Goals: Good  Plan Discharge plan remains appropriate    Co-evaluation    PT/OT/SLP Co-Evaluation/Treatment: Yes Reason for Co-Treatment: For patient/therapist safety;To address functional/ADL transfers PT goals addressed during session: Mobility/safety with mobility;Balance;Proper use of DME OT goals addressed during session: ADL's and self-care;Strengthening/ROM      AM-PAC OT "6 Clicks" Daily Activity     Outcome Measure   Help from another person eating meals?: None Help from another person taking care of personal grooming?: A Little Help from another person toileting, which includes using toliet, bedpan, or urinal?: A Lot Help from another person bathing (including washing, rinsing, drying)?: A Lot Help from another person to put on and taking off regular upper body clothing?: A Little Help from another person to put on and taking off regular lower body clothing?: A Lot 6  Click Score: 16    End of Session Equipment Utilized During Treatment: Other (comment);Rolling walker (2 wheels);Gait belt  OT Visit Diagnosis: Unsteadiness on feet (R26.81);History of falling (Z91.81);Muscle weakness (generalized) (M62.81);Pain;Other abnormalities of gait and mobility (R26.89) Pain - Right/Left: Left Pain - part of body: Leg   Activity Tolerance Patient tolerated treatment well   Patient Left in chair;with call bell/phone within reach;with chair alarm set           Time: [id number] OT Time Calculation (min): 34 min  Charges: OT General Charges $OT Visit: 1 Visit OT Treatments $Therapeutic Exercise: 8-22 mins  [place] Office [phone]    Almon Register 06/26/2022, 12:00 PM

## 2022-06-26 NOTE — Progress Notes (Signed)
Physical Medicine and Rehabilitation Consult Reason for Consult: Evaluate rehabilitation needs Referring Physician: Dahal     HPI: Megan Walter is a morbidly obese 77 y.o. female with a past medical history significant for osteoporosis.  She fell on 2/221/2024 and was taken to the emergency department with immediate onset left arm and left leg pain.  .  X-rays of the left leg demonstrated acute comminuted and displaced fractures of the proximal tibial metaphysis and transverse comminuted and mildly displaced fractures of the proximal fibular shaft.  X-rays of the wrist demonstrated comminuted impacted fractures of the distal left radius with nondisplaced fracture of the left ulnar styloid process.  Orthopedics was consulted and the patient underwent ORIF of the left wrist fracture and ORIF of the left tibial plateau fracture on 06/16/2022.  There were no postoperative anesthetic complications.CAGE screening was negative Initial Occupational Therapy evaluation was performed on 06/17/2022.  Previous functional status was independent.  Current functional status postoperative mod assist for upper body ADLs max assist lower body ADLs mod assist +2 for functional transfers.  Sit to stand with mod assist x 2 Restrictions include nonweightbearing status left lower limb, left upper extremity weightbearing through elbow only. Recommended DVT prophylaxis Eliquis 2.5 mg twice daily for 30 days, cuurently on lovenox .  Patient also noted to have vitamin D deficiency and vitamin D 50,000 units weekly x 8 weeks was ordered.  Patient also had rhabdomyolysis with CK up to 1109 on 06/18/2022.  Kidney functions however were normal.  Initial hemoglobin was 11.2 on 06/16/2022 which dropped to 9.6 postoperatively on 06/19/2022 but no transfusion was required Review of Systems  Constitutional:  Negative for chills and fever.  HENT:  Negative for nosebleeds.   Eyes:  Negative for discharge and redness.  Respiratory:  Positive  for shortness of breath. Negative for cough, hemoptysis and stridor.   Cardiovascular:  Positive for leg swelling. Negative for chest pain and palpitations.  Genitourinary:  Negative for dysuria and urgency.  Musculoskeletal:  Positive for falls and joint pain.       LUE ecchymosis and edema in fingers  Skin:  Negative for itching and rash.  Neurological:  Positive for weakness. Negative for tingling, sensory change and speech change.  Endo/Heme/Allergies:  Bruises/bleeds easily.  Psychiatric/Behavioral:  Negative for depression. The patient is nervous/anxious.     History reviewed. No pertinent past medical history.      Past Surgical History:  Procedure Laterality Date   ANKLE FRACTURE SURGERY Left      hardware   CATARACT EXTRACTION Right     ORIF PROXIMAL TIBIAL PLATEAU FRACTURE Right      hardware   WRIST FRACTURE SURGERY Right      hardware         Family History  Problem Relation Age of Onset   Hypertension Mother     Heart attack Mother      Social History:  reports that she has never smoked. She does not have any smokeless tobacco history on file. She reports that she does not drink alcohol and does not use drugs. Allergies: No Known Allergies       Medications Prior to Admission  Medication Sig Dispense Refill   calcium citrate (CALCITRATE - DOSED IN MG ELEMENTAL CALCIUM) 950 MG tablet Take 1 tablet by mouth daily.       cetirizine (ZYRTEC) 10 MG tablet Take 1 tablet (10 mg total) by mouth daily. 30 tablet 3   cholecalciferol (VITAMIN D) 1000 UNITS tablet  Take 1,000 Units by mouth daily.       fluticasone (FLONASE) 50 MCG/ACT nasal spray Place 2 sprays into both nostrils daily. 16 g 6   nitrofurantoin (MACRODANTIN) 100 MG capsule Take 100 mg by mouth 2 (two) times daily.          Home: Home Living Family/patient expects to be discharged to:: Private residence Living Arrangements: Spouse/significant other Available Help at Discharge: Family, Available 24  hours/day Type of Home: House Home Access: Stairs to enter ((getting a ramp)) Entrance Stairs-Number of Steps: 2 Entrance Stairs-Rails: Right, Left Home Layout: One level Bathroom Shower/Tub: Tub/shower unit, Architectural technologist: Handicapped height Bathroom Accessibility: Yes Home Equipment: [place] - single point  Functional History: Prior Function Prior Level of Function : Independent/Modified Independent, History of Falls (last six months), Driving Mobility Comments: ind, only this fall reported. ADLs Comments: ind Functional Status:  Mobility: Bed Mobility Overal bed mobility: Needs Assistance Bed Mobility: Supine to Sit Supine to sit: Min assist, HOB elevated General bed mobility comments: A for LLE only, VCs for only using elbow of left arm, increased time Transfers Overall transfer level: Needs assistance Equipment used: Left platform walker Transfers: Sit to/from Stand, Bed to chair/wheelchair/BSC Sit to Stand: Mod assist, From elevated surface Bed to/from chair/wheelchair/BSC transfer type:: Stand pivot Stand pivot transfers: Mod assist, +2 physical assistance, From elevated surface  Lateral/Scoot Transfers: Mod assist, +2 physical assistance General transfer comment: Pt was able to swivel pivot on RLE only minimally to get to recliner next to bed on her right side   ADL: ADL Overall ADL's : Needs assistance/impaired Eating/Feeding: Minimal assistance, Sitting Eating/Feeding Details (indicate cue type and reason): Min A for bilateral tasks Grooming: Minimal assistance, Sitting Grooming Details (indicate cue type and reason): Min A for bilateral tasks Upper Body Bathing: Moderate assistance, Sitting Lower Body Bathing: Maximal assistance, +2 for physical assistance, Sit to/from stand Upper Body Dressing : Moderate assistance, Sitting Lower Body Dressing: Maximal assistance, +2 for physical assistance, Sit to/from stand Toilet Transfer: Moderate assistance, +2 for  physical assistance, Stand-pivot Toilet Transfer Details (indicate cue type and reason): L PFRW, simulated be to recliner going to her right Toileting- Clothing Manipulation and Hygiene: Maximal assistance, Bed level Functional mobility during ADLs: Moderate assistance, +2 for physical assistance General ADL Comments: Pt with decreased strength, balance, ROM, and activity tolerance. Despite pain, pt very motivated and demonstrating poor rehab potential.   Cognition: Cognition Overall Cognitive Status: Within Functional Limits for tasks assessed Orientation Level: Oriented X4 Cognition Arousal/Alertness: Awake/alert Behavior During Therapy: WFL for tasks assessed/performed Overall Cognitive Status: Within Functional Limits for tasks assessed   Blood pressure 131/72, pulse 89, temperature 98.6 F (37 C), temperature source Oral, resp. rate 18, height '5\' 5"'$  (1.651 m), weight 124.1 kg, SpO2 95 %. Physical Exam Constitutional:      Appearance: She is obese.  HENT:     Head: Normocephalic and atraumatic.     Nose: No congestion or rhinorrhea.     Mouth/Throat:     Mouth: Mucous membranes are moist.  Eyes:     General: No scleral icterus.    Extraocular Movements: Extraocular movements intact.     Conjunctiva/sclera: Conjunctivae normal.     Pupils: Pupils are equal, round, and reactive to light.  Cardiovascular:     Rate and Rhythm: Normal rate. Rhythm irregular.     Heart sounds: No murmur heard. Pulmonary:     Effort: Pulmonary effort is normal. No respiratory distress.  Breath sounds: Normal breath sounds. No stridor. No wheezing, rhonchi or rales.  Abdominal:     General: Bowel sounds are normal. There is no distension.     Tenderness: There is no abdominal tenderness.  Musculoskeletal:     Cervical back: Neck supple.     Right lower leg: Edema present.     Left lower leg: Edema present.     Comments: Pain with finger flexion in the left hand.  Ecchymosis and edema of the  left fingers.  No joint deformities. No pain with shoulder abduction and flexion bilaterally.  No pain with right lower extremity range of motion or limitations Left lower extremity has pain with minimal movement of the left hip and knee no pain with foot and toe active and passive range of motion.   Left Knee incisions without drainage or erythema  Skin:    General: Skin is warm and dry.  Neurological:     Mental Status: She is alert and oriented to person, place, and time.     Comments: Motor strength is 5/5 in the right deltoid bicep tricep grip as well as right hip flexion knee extension ankle dorsiflexion 4 - at the left shoulder bicep tricep 3 - at the finger flexors and extensors on the left side limited by pain Left lower extremity trace hip flexion knee extension and 4/5 ankle dorsiflexion plantarflexion.  Psychiatric:        Mood and Affect: Mood normal.        Behavior: Behavior normal.        Lab Results Last 24 Hours       Results for orders placed or performed during the hospital encounter of 06/14/22 (from the past 24 hour(s))  CBC     Status: Abnormal    Collection Time: 06/19/22  2:44 AM  Result Value Ref Range    WBC 7.1 4.0 - 10.5 K/uL    RBC 3.33 (L) 3.87 - 5.11 MIL/uL    Hemoglobin 9.6 (L) 12.0 - 15.0 g/dL    HCT 29.6 (L) 36.0 - 46.0 %    MCV 88.9 80.0 - 100.0 fL    MCH 28.8 26.0 - 34.0 pg    MCHC 32.4 30.0 - 36.0 g/dL    RDW 13.2 11.5 - 15.5 %    Platelets 220 150 - 400 K/uL    nRBC 0.0 0.0 - 0.2 %      Imaging Results (Last 48 hours)  No results found.     Assessment/Plan: Diagnosis: Left radius as well as left ulnar styloid fracture as well as left tibial plateau fracture and left proximal fibular fracture following a fall. Does the need for close, 24 hr/day medical supervision in concert with the patient's rehab needs make it unreasonable for this patient to be served in a less intensive setting? Yes Co-Morbidities requiring supervision/potential  complications:  -Nonweightbearing left lower limb as well as through left wrist postoperative swelling left upper limb left lower limb, .  Due to bladder management, bowel management, safety, skin/wound care, disease management, medication administration, pain management, and patient education, does the patient require 24 hr/day rehab nursing? Yes Does the patient require coordinated care of a physician, rehab nurse, therapy disciplines of PT and OT to address physical and functional deficits in the context of the above medical diagnosis(es)? Yes Addressing deficits in the following areas: balance, endurance, locomotion, strength, transferring, bowel/bladder control, bathing, dressing, feeding, grooming, toileting, and psychosocial support Can the patient actively participate in an intensive  therapy program of at least 3 hrs of therapy per day at least 5 days per week? Yes The potential for patient to make measurable gains while on inpatient rehab is good Anticipated functional outcomes upon discharge from inpatient rehab are supervision and min assist  with PT, min assist with OT, n/a with SLP. Estimated rehab length of stay to reach the above functional goals is: 10-14d Anticipated discharge destination: Home Overall Rehab/Functional Prognosis: good   POST ACUTE RECOMMENDATIONS: This patient's condition is appropriate for continued rehabilitative care in the following setting: CIR Patient has agreed to participate in recommended program. Yes Note that insurance prior authorization may be required for reimbursement for recommended care.   Comment: Has husband and other family that can provide care post hospitalization     MEDICAL RECOMMENDATIONS: Monitor CK and renal function off IV fluid, may need to resume if either values start to increase.  Will ask therapy to perform edema reduction techniques.     I have personally performed a face to face diagnostic evaluation of this patient.  Additionally, I have examined the patient's medical record including any pertinent labs and radiographic images. Thanks,   Charlett Blake, MD 06/19/2022   [facility] Fellow [employer] Diplomate Am Board of Electrodiagnostic Med Fellow Am Board of Interventional Pain

## 2022-06-26 NOTE — Plan of Care (Signed)
  Problem: Consults Goal: RH GENERAL PATIENT EDUCATION Description: See Patient Education module for education specifics. Outcome: Progressing   Problem: RH BOWEL ELIMINATION Goal: RH STG MANAGE BOWEL WITH ASSISTANCE Description: STG Manage Bowel with toileting Assistance. Outcome: Progressing Goal: RH STG MANAGE BOWEL W/MEDICATION W/ASSISTANCE Description: STG Manage Bowel with Medication with mod I Assistance. Outcome: Progressing   Problem: RH BLADDER ELIMINATION Goal: RH STG MANAGE BLADDER WITH ASSISTANCE Description: STG Manage Bladder With toileting Assistance Outcome: Progressing   Problem: RH SAFETY Goal: RH STG ADHERE TO SAFETY PRECAUTIONS W/ASSISTANCE/DEVICE Description: STG Adhere to Safety Precautions With cues Assistance/Device. Outcome: Progressing   Problem: RH PAIN MANAGEMENT Goal: RH STG PAIN MANAGED AT OR BELOW PT'S PAIN GOAL Outcome: Progressing   Problem: RH KNOWLEDGE DEFICIT GENERAL Goal: RH STG INCREASE KNOWLEDGE OF SELF CARE AFTER HOSPITALIZATION Description: Patient and spouse will be able to manage care at discharge using educational handouts independently Outcome: Progressing

## 2022-06-27 DIAGNOSIS — T1490XA Injury, unspecified, initial encounter: Secondary | ICD-10-CM | POA: Diagnosis not present

## 2022-06-27 LAB — CBC WITH DIFFERENTIAL/PLATELET
Abs Immature Granulocytes: 0.03 10*3/uL (ref 0.00–0.07)
Basophils Absolute: 0.1 10*3/uL (ref 0.0–0.1)
Basophils Relative: 1 %
Eosinophils Absolute: 0.1 10*3/uL (ref 0.0–0.5)
Eosinophils Relative: 2 %
HCT: 33.3 % — ABNORMAL LOW (ref 36.0–46.0)
Hemoglobin: 11.1 g/dL — ABNORMAL LOW (ref 12.0–15.0)
Immature Granulocytes: 1 %
Lymphocytes Relative: 18 %
Lymphs Abs: 1.1 10*3/uL (ref 0.7–4.0)
MCH: 28.6 pg (ref 26.0–34.0)
MCHC: 33.3 g/dL (ref 30.0–36.0)
MCV: 85.8 fL (ref 80.0–100.0)
Monocytes Absolute: 0.4 10*3/uL (ref 0.1–1.0)
Monocytes Relative: 7 %
Neutro Abs: 4.6 10*3/uL (ref 1.7–7.7)
Neutrophils Relative %: 71 %
Platelets: 418 10*3/uL — ABNORMAL HIGH (ref 150–400)
RBC: 3.88 MIL/uL (ref 3.87–5.11)
RDW: 13.2 % (ref 11.5–15.5)
WBC: 6.4 10*3/uL (ref 4.0–10.5)
nRBC: 0 % (ref 0.0–0.2)

## 2022-06-27 LAB — COMPREHENSIVE METABOLIC PANEL
ALT: 25 U/L (ref 0–44)
AST: 22 U/L (ref 15–41)
Albumin: 2.8 g/dL — ABNORMAL LOW (ref 3.5–5.0)
Alkaline Phosphatase: 105 U/L (ref 38–126)
Anion gap: 5 (ref 5–15)
BUN: 11 mg/dL (ref 8–23)
CO2: 29 mmol/L (ref 22–32)
Calcium: 8.8 mg/dL — ABNORMAL LOW (ref 8.9–10.3)
Chloride: 103 mmol/L (ref 98–111)
Creatinine, Ser: 0.83 mg/dL (ref 0.44–1.00)
GFR, Estimated: >60 mL/min (ref >60)
Glucose, Bld: 121 mg/dL — ABNORMAL HIGH (ref 70–99)
Potassium: 3.3 mmol/L — ABNORMAL LOW (ref 3.5–5.1)
Sodium: 137 mmol/L (ref 135–145)
Total Bilirubin: 1.6 mg/dL — ABNORMAL HIGH (ref 0.3–1.2)
Total Protein: 6.2 g/dL — ABNORMAL LOW (ref 6.5–8.1)

## 2022-06-27 LAB — MAGNESIUM: Magnesium: 2 mg/dL (ref 1.7–2.4)

## 2022-06-27 LAB — VITAMIN D 25 HYDROXY (VIT D DEFICIENCY, FRACTURES): Vit D, 25-Hydroxy: 38.98 ng/mL (ref 30–100)

## 2022-06-27 MED ORDER — POTASSIUM CHLORIDE 20 MEQ PO PACK
40.0000 meq | PACK | Freq: Once | ORAL | Status: AC
  Administered 2022-06-27: 40 meq via ORAL
  Filled 2022-06-27: qty 2

## 2022-06-27 NOTE — Evaluation (Signed)
Occupational Therapy Assessment and Plan  Patient Details  Name: Megan Walter MRN: [id number] Date of Birth: 01/21/1946  OT Diagnosis: acute pain and muscle weakness (generalized) Rehab Potential: Rehab Potential (ACUTE ONLY): Good ELOS: 7-10 days   Today's Date: 06/27/2022 OT Individual Time: [id number] OT Individual Time Calculation (min): 75 min     Hospital Problem: Principal Problem:   Trauma   Past Medical History: History reviewed. No pertinent past medical history. Past Surgical History:  Past Surgical History:  Procedure Laterality Date   ANKLE FRACTURE SURGERY Left    hardware   CATARACT EXTRACTION Right    ORIF PROXIMAL TIBIAL PLATEAU FRACTURE Right    hardware   ORIF TIBIA PLATEAU Left 06/16/2022   Procedure: OPEN REDUCTION INTERNAL FIXATION (ORIF) TIBIAL PLATEAU;  Surgeon: Shona Needles, MD;  Location: [place];  Service: Orthopedics;  Laterality: Left;   ORIF WRIST FRACTURE Left 06/16/2022   Procedure: OPEN REDUCTION INTERNAL FIXATION (ORIF) WRIST FRACTURE;  Surgeon: Shona Needles, MD;  Location: [place];  Service: Orthopedics;  Laterality: Left;   WRIST FRACTURE SURGERY Right    hardware    Assessment & Plan Clinical Impression:  Megan Walter. [name] is a 77 year old female with history of osteoporosis, obesity- BMI 40.9 who was admitted on 06/15/22 after slipping and falling in her garage with onset of left wrist and left knee pain.  She was found to have left tibial plateau fracture, left distal radius fracture with non displaced fracture of ulnar styloid process.  Denies head trauma or LOC. She was evaluated by Dr. Doreatha Martin and underwent ORIF left tibial plateau and ORIF left distal radius on 06/16/22. Post op to be MWB LLE and NWB left wrist--can WB thru elbow. She was started on lovenox and transitioned to low dose eliquis for DVT prophylaxis.  She was started on vitamin D for vitamin D deficiency.  She is recommended to be on DVT prophylaxis with Eliquis 2.5 mg twice  daily for 30 days at discharge.  Patient reports her pain is under control with current medications.  Patient was noted to have chronic bilateral lower extremity lymphedema, she has not noted to have a history of CHF.  She was also diagnosed with rhabdomyolysis and this improved with IV hydration.  She reports chronic altered sensation in her right lower extremity from a prior surgery.   Patient transferred to CIR on 06/26/2022 .    Patient currently requires min with basic self-care skills secondary to muscle weakness, decreased cardiorespiratoy endurance, and decreased standing balance, decreased postural control, and decreased balance strategies.  Prior to hospitalization, patient could complete ADLs with independent .  Patient will benefit from skilled intervention to increase independence with basic self-care skills prior to discharge home with care partner.  Anticipate patient will require intermittent supervision and minimal physical assistance and follow up home health.  OT - End of Session Activity Tolerance: Tolerates 10 - 20 min activity with multiple rests Endurance Deficit: Yes Endurance Deficit Description: generalized weakness OT Assessment Rehab Potential (ACUTE ONLY): Good OT Patient demonstrates impairments in the following area(s): Balance;Endurance;Motor;Pain;Edema OT Basic ADL's Functional Problem(s): Grooming;Bathing;Dressing;Toileting OT Transfers Functional Problem(s): Toilet;Tub/Shower OT Additional Impairment(s): None OT Plan OT Intensity: Minimum of 1-2 x/day, 45 to 90 minutes OT Frequency: 5 out of 7 days OT Duration/Estimated Length of Stay: 7-10 days OT Treatment/Interventions: Balance/vestibular training;Discharge planning;Pain management;Self Care/advanced ADL retraining;Therapeutic Activities;UE/LE Coordination activities;Functional mobility training;Patient/family education;Therapeutic Exercise;Wheelchair propulsion/positioning;UE/LE Strength  taining/ROM;Psychosocial support;DME/adaptive equipment instruction OT Self Feeding Anticipated Outcome(s):  no goal OT Basic Self-Care Anticipated Outcome(s): mod I OT Toileting Anticipated Outcome(s): mod I OT Bathroom Transfers Anticipated Outcome(s): (S)- min A OT Recommendation Patient destination: Home Follow Up Recommendations: Home health OT Equipment Recommended: 3 in 1 bedside comode   OT Evaluation Precautions/Restrictions  Precautions Precautions: Fall Restrictions Weight Bearing Restrictions: Yes LUE Weight Bearing: Weight bear through elbow only LLE Weight Bearing: Non weight bearing General Chart Reviewed: Yes Family/Caregiver Present: Yes (daughter) Vital Signs  Pain   Home Living/Prior Functioning Home Living Family/patient expects to be discharged to:: Private residence Living Arrangements: Spouse/significant other Available Help at Discharge: Family, Available 24 hours/day Type of Home: House Home Access: Stairs to enter [employer] of Steps: 2 Entrance Stairs-Rails: Right, Left Bathroom Shower/Tub: Chiropodist: Handicapped height Bathroom Accessibility: Yes  Lives With: Spouse IADL History Homemaking Responsibilities: Yes Meal Prep Responsibility: Primary Laundry Responsibility: Primary Cleaning Responsibility: Primary Bill Paying/Finance Responsibility: Primary Shopping Responsibility: Primary Current License: Yes Mode of Transportation: Car Prior Function Level of Independence: Independent with basic ADLs, Independent with homemaking with ambulation, Independent with gait  Able to Take Stairs?: Yes Driving: Yes Vocation: Retired Surveyor, mining Baseline Vision/History: 0 No visual deficits Ability to See in Adequate Light: 0 Adequate Patient Visual Report: No change from baseline Vision Assessment?: No apparent visual deficits Perception  Perception: Within Functional Limits Praxis Praxis:  Intact Cognition Cognition Overall Cognitive Status: Within Functional Limits for tasks assessed Arousal/Alertness: Awake/alert Memory: Appears intact Awareness: Appears intact Problem Solving: Appears intact Safety/Judgment: Appears intact Brief Interview for Mental Status (BIMS) Repetition of Three Words (First Attempt): 3 Temporal Orientation: Year: Correct Temporal Orientation: Month: Accurate within 5 days Temporal Orientation: Day: Correct Recall: "Sock": Yes, no cue required Recall: "Blue": Yes, no cue required Recall: "Bed": Yes, no cue required BIMS Summary Score: 15 Sensation Sensation Light Touch: Appears Intact Hot/Cold: Appears Intact Proprioception: Appears Intact Coordination Gross Motor Movements are Fluid and Coordinated: No Fine Motor Movements are Fluid and Coordinated: No Coordination and Movement Description: LUE/LLE NWB limitations Motor  Motor Motor: Other (comment) Motor - Skilled Clinical Observations: NWB restrictions with generalized weakness  Trunk/Postural Assessment  Cervical Assessment Cervical Assessment: Within Functional Limits Thoracic Assessment Thoracic Assessment: Within Functional Limits Lumbar Assessment Lumbar Assessment: Within Functional Limits Postural Control Postural Control: Deficits on evaluation (delayed 2/2 WB precautions)  Balance Balance Balance Assessed: Yes Static Sitting Balance Static Sitting - Balance Support: Feet supported Static Sitting - Level of Assistance: 6: Modified independent (Device/Increase time) Dynamic Sitting Balance Dynamic Sitting - Balance Support: Feet supported Dynamic Sitting - Level of Assistance: 6: Modified independent (Device/Increase time) Static Standing Balance Static Standing - Balance Support: During functional activity;Bilateral upper extremity supported Static Standing - Level of Assistance: 5: Stand by assistance Dynamic Standing Balance Dynamic Standing - Balance Support:  During functional activity;Bilateral upper extremity supported Dynamic Standing - Level of Assistance: 4: Min assist Extremity/Trunk Assessment RUE Assessment RUE Assessment: Within Functional Limits LUE Assessment LUE Assessment: Exceptions to [facility] General Strength Comments: WB restrictions at wrist- [facility] for the shoulder  Care Tool Care Tool Self Care Eating   Eating Assist Level: Independent    Oral Care    Oral Care Assist Level: Supervision/Verbal cueing    Bathing   Body parts bathed by patient: Right arm;Left arm;Left upper leg;Right lower leg;Chest;Abdomen;Left lower leg;Front perineal area;Face;Right upper leg Body parts bathed by helper: Buttocks   Assist Level: Minimal Assistance - Patient > 75%    Upper Body Dressing(including orthotics)   What  is the patient wearing?: Pull over shirt   Assist Level: Set up assist    Lower Body Dressing (excluding footwear)   What is the patient wearing?: Underwear/pull up;Pants Assist for lower body dressing: Moderate Assistance - Patient 50 - 74%    Putting on/Taking off footwear   What is the patient wearing?: Non-skid slipper socks;Shoes Assist for footwear: Moderate Assistance - Patient 50 - 74%       Care Tool Toileting Toileting activity   Assist for toileting: Minimal Assistance - Patient > 75%     Care Tool Bed Mobility Roll left and right activity   Roll left and right assist level: Supervision/Verbal cueing    Sit to lying activity   Sit to lying assist level: Supervision/Verbal cueing    Lying to sitting on side of bed activity   Lying to sitting on side of bed assist level: the ability to move from lying on the back to sitting on the side of the bed with no back support.: Supervision/Verbal cueing     Care Tool Transfers Sit to stand transfer   Sit to stand assist level: Minimal Assistance - Patient > 75%    Chair/bed transfer   Chair/bed transfer assist level: Minimal Assistance - Patient > 75%      Toilet transfer   Assist Level: Minimal Assistance - Patient > 75%     Care Tool Cognition  Expression of Ideas and Wants Expression of Ideas and Wants: 4. Without difficulty (complex and basic) - expresses complex messages without difficulty and with speech that is clear and easy to understand  Understanding Verbal and Non-Verbal Content Understanding Verbal and Non-Verbal Content: 4. Understands (complex and basic) - clear comprehension without cues or repetitions   Memory/Recall Ability Memory/Recall Ability : Current season;Location of own room;Staff names and faces;That he or she is in a hospital/hospital unit   Refer to Care Plan for [place] 1 OT Short Term Goal 1 (Week 1): STG= LTG d/t ELOS  Recommendations for other services: None    Skilled Therapeutic Intervention ADL ADL Eating: Independent Where Assessed-Eating: Edge of bed Grooming: Supervision/safety Where Assessed-Grooming: Wheelchair Upper Body Bathing: Supervision/safety Where Assessed-Upper Body Bathing: Wheelchair Lower Body Bathing: Minimal assistance Where Assessed-Lower Body Bathing: Sitting at sink;Standing at sink Upper Body Dressing: Supervision/safety Where Assessed-Upper Body Dressing: Sitting at sink Lower Body Dressing: Moderate assistance Where Assessed-Lower Body Dressing: Sitting at sink Toileting: Minimal assistance Where Assessed-Toileting: Bedside Commode Toilet Transfer: Minimal assistance Toilet Transfer Method: Stand pivot Tub/Shower Transfer: Moderate assistance Tub/Shower Transfer Method: Stand pivot Tub/Shower Equipment: Transfer tub bench Mobility  Bed Mobility Bed Mobility: Supine to Sit;Sit to Supine Supine to Sit: Supervision/Verbal cueing Sit to Supine: Supervision/Verbal cueing Transfers Sit to Stand: Minimal Assistance - Patient > 75% Stand to Sit: Minimal Assistance - Patient > 75%   Skilled OT evaluation completed with the creation of pt  centered OT POC. Pt educated on condition, ELOS, rehab expectations, and fall risk reduction strategies throughout session. Her daughter was present throughout the session and very supportive. She completed ADLs as described above. She completed transfers at min A- CGA level overall. With cueing for proper RW management she was able to progress to hopping 2-3x. Completed TTB transfer and had extensive discussion with her and daughter re home accessibility and d/c planning. Good adherence overall to weightbearing precautions. She was left supine with all needs met.    Discharge Criteria: Patient will be discharged from OT if  patient refuses treatment 3 consecutive times without medical reason, if treatment goals not met, if there is a change in medical status, if patient makes no progress towards goals or if patient is discharged from hospital.  The above assessment, treatment plan, treatment alternatives and goals were discussed and mutually agreed upon: by patient and by family  Curtis Sites 06/27/2022, 10:34 AM

## 2022-06-27 NOTE — Progress Notes (Signed)
Inpatient Rehabilitation Care Coordinator Assessment and Plan Patient Details  Name: Megan Walter MRN: [id number] Date of Birth: 07-Apr-1946  Today's Date: 06/27/2022  Hospital Problems: Principal Problem:   Trauma  Past Medical History: History reviewed. No pertinent past medical history. Past Surgical History:  Past Surgical History:  Procedure Laterality Date   ANKLE FRACTURE SURGERY Left    hardware   CATARACT EXTRACTION Right    ORIF PROXIMAL TIBIAL PLATEAU FRACTURE Right    hardware   ORIF TIBIA PLATEAU Left 06/16/2022   Procedure: OPEN REDUCTION INTERNAL FIXATION (ORIF) TIBIAL PLATEAU;  Surgeon: Shona Needles, MD;  Location: [place];  Service: Orthopedics;  Laterality: Left;   ORIF WRIST FRACTURE Left 06/16/2022   Procedure: OPEN REDUCTION INTERNAL FIXATION (ORIF) WRIST FRACTURE;  Surgeon: Shona Needles, MD;  Location: [place];  Service: Orthopedics;  Laterality: Left;   WRIST FRACTURE SURGERY Right    hardware   Social History:  reports that she has never smoked. She does not have any smokeless tobacco history on file. She reports that she does not drink alcohol and does not use drugs.  Family / Support Systems Patient Roles: Spouse, Parent Spouse/Significant Other: Wonda Olds Children: Amy Ferdinand Lango and Barrington Ellison Other Supports: N/A Anticipated Caregiver: Spouse and 2 daughters who live nearby Ability/Limitations of Caregiver: Min A Caregiver Availability: 24/7 Family Dynamics: supportive family  Social History Preferred language: English Religion:  Education: [place] - How often do you need to have someone help you when you read instructions, pamphlets, or other written material from your doctor or pharmacy?: Never Writes: Yes Legal History/Current Legal Issues: N/A Guardian/Conservator: N/A   Abuse/Neglect Abuse/Neglect Assessment Can Be Completed: Yes Physical Abuse: Denies Verbal Abuse: Denies Sexual Abuse: Denies Exploitation of  patient/patient's resources: Denies Self-Neglect: Denies  Patient response to: Social Isolation - How often do you feel lonely or isolated from those around you?: Never  Emotional Status Pt's affect, behavior and adjustment status: Pleasant Recent Psychosocial Issues: Coping Psychiatric History: N/A Substance Abuse History: N/A  Patient / Family Perceptions, Expectations & Goals Pt/Family understanding of illness & functional limitations: yes Premorbid pt/family roles/activities: Independent Anticipated changes in roles/activities/participation: Spouse works, 2 daughter anticpates [facility] from pt's home to assist Pt/family expectations/goals: [place]: None Premorbid Home Care/DME Agencies: None Transportation available at discharge: Spouse or daughters able to transport Is the patient able to respond to transportation needs?: Yes In the past 12 months, has lack of transportation kept you from medical appointments or from getting medications?: No In the past 12 months, has lack of transportation kept you from meetings, work, or from getting things needed for daily living?: No Resource referrals recommended: Neuropsychology  Discharge Planning Living Arrangements: Spouse/significant other Support Systems: Children, Spouse/significant other Type of Residence: Private residence (1 level home, ramp entrance) Insurance Resources: Multimedia programmer (specify) Nurse, mental health MEDICARE) Financial Resources: Radio broadcast assistant Screen Referred: No Living Expenses: Own Money Management: Patient, Spouse, Family Does the patient have any problems obtaining your medications?: No Home Management: Independent Patient/Family Preliminary Plans: Plans to continue, if unable daughter able to assist with management Care Coordinator Barriers to Discharge: Decreased caregiver support, Insurance for SNF coverage Care Coordinator Anticipated Follow Up  Needs: HH/OP Expected length of stay: 10-14 Days  Clinical Impression Sw met with patient, introduced self and explained role. Patient anticipates discharging back home with spouse and 2 daughters to assist. Spouse still working currently, daughters plan to [facility] from pt's home to  provide 24/7. Patient now has a ramped entrance. No additional questions or concerns.  Dyanne Iha 06/27/2022, 12:35 PM

## 2022-06-27 NOTE — Progress Notes (Signed)
Physical Therapy Session Note  Patient Details  Name: Megan Walter MRN: [id number] Date of Birth: August 15, 1945  Today's Date: 06/27/2022 PT Individual Time: 1421-1535 PT Individual Time Calculation (min): 74 min   Short Term Goals: Week 1:  PT Short Term Goal 1 (Week 1): STG = LTGs 2/2 ELOS.  Skilled Therapeutic Interventions/Progress Updates:  Pt received in bed, no c/o pain at start of session. Pt required cues for sequencing roll in bed and sit up to long sitting position to work LE's off edge. Rt shoe donned total assist for time management. Min assist for Lt LE off EOB at start of session. Pt completed sit<>stand with Lt UE platform walker and min assist to power up. Pt able to maintain NWB on Lt LE with walker and hop to pattern on Rt LE; amb ~ 8' with walker in room; WC positioned behind pt for seated rest. Pt dependently rolled to gym for energy conservation. 3 bouts of gait training completed with Min assist to power up from [facility] and min-mod assist for walker progression and to steady with forward hops on Rt LE. Pt amb with [place]on Pier walker 1x 4', Lt platform walker 1x 6' and 1x8'. Pt then dependently transferred to ortho gym to complete LE strengthening on mat table. Educated pt on use of belt to assist with bringing Lt LE on/off EOM. Min assist to lower to supine with wedge for trunk comfort.   Supine LE strengthening; - 2x 10 reps Rt LE bridge - 2x10 heel slide on Lt LE on sliding board  - 2x10 hip abduction AAROM on sliding board  Seated LE strengthening: - 2x10 Rt LE LAQ; 4lbs and then 5lbs; 2x 10 Lt LE no weight  Patient then completed sit<>stand with Platform walker and amb ~10' from mat to NuStep. NuStep with Rt LE/UE on level 1; pt only able to complete 2 minutes due to Rt UE discomfort, pt seems to be describing triceps activation. Pt performed stand pivot transfer with min assist to rise and mod assist to guide walker for turn to move NuStep>WC. Pt dependently rolled back to room for  time management and energy conservation. Stand pivot completed with platform walker WC>bed with min assist and pt complete sit>supine with use of belt for Lt LE and CGA for safety. EOS pt resting in supine, fresh ice on Lt knee, call bell within reach, and alarm on.   Pt self propelled back to room  Therapy Documentation Precautions:  Precautions Precautions: Fall Restrictions Weight Bearing Restrictions: Yes LUE Weight Bearing: Weight bear through elbow only LLE Weight Bearing: Non weight bearing General: Chart Reviewed: Yes Family/Caregiver Present: Yes Vital Signs:  Pain: Pain Assessment Pain Scale: 0-10 Pain Score: 5  Pain Type: Surgical pain Pain Location: Leg Pain Orientation: Left Pain Descriptors / Indicators: Sharp      Therapy/Group: Individual Therapy  Verner Mould, DPT Acute Rehabilitation Services Office [phone]  06/27/22 1:01 PM

## 2022-06-27 NOTE — Progress Notes (Signed)
Inpatient Rehabilitation  Patient information reviewed and entered into eRehab system by Qunisha Bryk M. Armstead Heiland, M.A., CCC/SLP, PPS Coordinator.  Information including medical coding, functional ability and quality indicators will be reviewed and updated through discharge.    

## 2022-06-27 NOTE — Evaluation (Signed)
Physical Therapy Assessment and Plan  Patient Details  Name: Megan Walter MRN: [id number] Date of Birth: 1946-04-17  PT Diagnosis: Abnormality of gait, Muscle weakness, and Pain in L lower leg Rehab Potential: Good ELOS: 7-10 days   Today's Date: 06/27/2022 PT Individual Time: [id number] PT Individual Time Calculation (min): 63 min    Hospital Problem: Principal Problem:   Trauma   Past Medical History: History reviewed. No pertinent past medical history. Past Surgical History:  Past Surgical History:  Procedure Laterality Date   ANKLE FRACTURE SURGERY Left    hardware   CATARACT EXTRACTION Right    ORIF PROXIMAL TIBIAL PLATEAU FRACTURE Right    hardware   ORIF TIBIA PLATEAU Left 06/16/2022   Procedure: OPEN REDUCTION INTERNAL FIXATION (ORIF) TIBIAL PLATEAU;  Surgeon: Shona Needles, MD;  Location: [place] [facility];  Service: Orthopedics;  Laterality: Left;   ORIF WRIST FRACTURE Left 06/16/2022   Procedure: OPEN REDUCTION INTERNAL FIXATION (ORIF) WRIST FRACTURE;  Surgeon: Shona Needles, MD;  Location: [place];  Service: Orthopedics;  Laterality: Left;   WRIST FRACTURE SURGERY Right    hardware    Assessment & Plan Clinical Impression: Megan Walter is a 77 year old female with history of osteoporosis, obesity- BMI 40.9 who was admitted on 06/15/22 after slipping and falling in her garage with onset of left wrist and left knee pain.  She was found to have left tibial plateau fracture, left distal radius fracture with non displaced fracture of ulnar styloid process.  Denies head trauma or LOC. She was evaluated by Dr. Doreatha Martin and underwent ORIF left tibial plateau and ORIF left distal radius on 06/16/22. Post op to be MWB LLE and NWB left wrist--can WB thru elbow. She was started on lovenox and transitioned to low dose eliquis for DVT prophylaxis.  She was started on vitamin D for vitamin D deficiency.  She is recommended to be on DVT prophylaxis with Eliquis 2.5 mg twice daily for 30 days at  discharge.  Patient reports her pain is under control with current medications.  Patient was noted to have chronic bilateral lower extremity lymphedema, she has not noted to have a history of CHF.  She was also diagnosed with rhabdomyolysis and this improved with IV hydration.  She reports chronic altered sensation in her right lower extremity from a prior surgery.    Patient currently requires min with mobility secondary to muscle weakness.  Prior to hospitalization, patient was independent  with mobility and lived with Spouse in a House home.  Home access is 2Stairs to enter, Ramped entrance.  Patient will benefit from skilled PT intervention to maximize safe functional mobility, minimize fall risk, and decrease caregiver burden for planned discharge home with 24 hour supervision.  Anticipate patient will  have HHPT, may need after WB restrictions removed  at discharge.  PT - End of Session Activity Tolerance: Tolerates 30+ min activity with multiple rests Endurance Deficit: Yes Endurance Deficit Description: generalized weakness PT Assessment Rehab Potential (ACUTE/IP ONLY): Good PT Barriers to Discharge: Home environment access/layout;Weight bearing restrictions PT Barriers to Discharge Comments: NWB to LUE and LLE PT Patient demonstrates impairments in the following area(s): Balance;Safety;Endurance;Motor;Pain PT Transfers Functional Problem(s): Bed Mobility;Bed to Chair;Car;Furniture PT Locomotion Functional Problem(s): Wheelchair Mobility;Ambulation PT Plan PT Intensity: Minimum of 1-2 x/day ,45 to 90 minutes PT Frequency: 5 out of 7 days PT Duration Estimated Length of Stay: 7-10 days PT Treatment/Interventions: Ambulation/gait training;Community reintegration;Neuromuscular re-education;UE/LE Strength taining/ROM;Wheelchair propulsion/positioning;Balance/vestibular training;Discharge planning;Pain management;Therapeutic Activities;UE/LE Coordination  activities;Functional mobility  training;Patient/family education;Therapeutic Exercise PT Transfers Anticipated Outcome(s): supervision OOB w/ LRAD, mod I bed mobility. PT Locomotion Anticipated Outcome(s): w/c mobility supervision, gait w/ min A PT Recommendation Follow Up Recommendations: Home health PT (may need delay until WB increases.) Patient destination: Home Equipment Recommended: Wheelchair cushion (measurements);Wheelchair (measurements) Equipment Details: 24"   PT Evaluation Precautions/Restrictions Precautions Precautions: Fall Restrictions Weight Bearing Restrictions: Yes LUE Weight Bearing: Weight bear through elbow only LLE Weight Bearing: Non weight bearing General Chart Reviewed: Yes Family/Caregiver Present: Yes Vital Signs Pain Pain Assessment Pain Scale: 0-10 Pain Score: 5  Pain Type: Surgical pain Pain Location: Leg Pain Orientation: Left Pain Descriptors / Indicators: Sharp Pain Interference Pain Interference Pain Effect on Sleep: 1. Rarely or not at all Pain Interference with Therapy Activities: 1. Rarely or not at all Pain Interference with Day-to-Day Activities: 1. Rarely or not at all Home Living/Prior [place] Available Help at Discharge: Family;Available 24 hours/day Type of Home: House Home Access: Stairs to enter;Ramped entrance Entrance Stairs-Number of Steps: 2 Entrance Stairs-Rails: Right;Left Home Layout: One level Bathroom Shower/Tub: Chiropodist: Handicapped height Bathroom Accessibility: Yes  Lives With: Spouse Prior Function Level of Independence: Independent with transfers;Independent with gait  Able to Take Stairs?: Yes Driving: Yes Vocation: Retired Vision/Perception  Vision - History Ability to See in Adequate Light: 0 Adequate Perception Perception: Within Functional Limits Praxis Praxis: Intact  Cognition Overall Cognitive Status: Within Functional Limits for tasks assessed Arousal/Alertness:  Awake/alert Orientation Level: Oriented X4 Memory: Appears intact Awareness: Appears intact Problem Solving: Appears intact Safety/Judgment: Appears intact Sensation Sensation Light Touch: Appears Intact Hot/Cold: Appears Intact Proprioception: Appears Intact Coordination Gross Motor Movements are Fluid and Coordinated: No Fine Motor Movements are Fluid and Coordinated: No Coordination and Movement Description: LUE/LLE NWB limitations Heel Shin Test: unable to perform. Motor  Motor Motor: Other (comment) Motor - Skilled Clinical Observations: NWB restrictions with generalized weakness   Trunk/Postural Assessment  Cervical Assessment Cervical Assessment: Within Functional Limits Thoracic Assessment Thoracic Assessment: Within Functional Limits Lumbar Assessment Lumbar Assessment: Within Functional Limits Postural Control Postural Control: Deficits on evaluation (delayed 2/2 WB precautions)  Balance Balance Balance Assessed: Yes Static Sitting Balance Static Sitting - Balance Support: Feet supported Static Sitting - Level of Assistance: 6: Modified independent (Device/Increase time) Dynamic Sitting Balance Dynamic Sitting - Balance Support: Feet supported Dynamic Sitting - Level of Assistance: 6: Modified independent (Device/Increase time) Static Standing Balance Static Standing - Balance Support: During functional activity;Bilateral upper extremity supported Static Standing - Level of Assistance: 5: Stand by assistance Dynamic Standing Balance Dynamic Standing - Balance Support: During functional activity;Bilateral upper extremity supported Dynamic Standing - Level of Assistance: 4: Min assist Extremity Assessment  RUE Assessment RUE Assessment: Within Functional Limits LUE Assessment LUE Assessment: Exceptions to [facility] General Strength Comments: WB restrictions at wrist- [facility] for the shoulder RLE Assessment RLE Assessment: Within Functional Limits LLE Assessment LLE  Assessment: Exceptions to [facility] General Strength Comments: grossly 3-/5 to 3/5, limited by pain.  Care Tool Care Tool Bed Mobility Roll left and right activity   Roll left and right assist level: Supervision/Verbal cueing    Sit to lying activity   Sit to lying assist level: Minimal Assistance - Patient > 75%    Lying to sitting on side of bed activity   Lying to sitting on side of bed assist level: the ability to move from lying on the back to sitting on the side of the bed with no back  support.: Supervision/Verbal cueing     Care Tool Transfers Sit to stand transfer   Sit to stand assist level: Minimal Assistance - Patient > 75%    Chair/bed transfer   Chair/bed transfer assist level: Minimal Assistance - Patient > 75%     Toilet transfer   Assist Level: Minimal Assistance - Patient > 75%    Car transfer   Car transfer assist level: Minimal Assistance - Patient > 75%      Care Tool Locomotion Ambulation Ambulation activity did not occur: Safety/medical concerns        Walk 10 feet activity Walk 10 feet activity did not occur: Safety/medical concerns       Walk 50 feet with 2 turns activity Walk 50 feet with 2 turns activity did not occur: Safety/medical concerns      Walk 150 feet activity Walk 150 feet activity did not occur: Safety/medical concerns      Walk 10 feet on uneven surfaces activity Walk 10 feet on uneven surfaces activity did not occur: Safety/medical concerns      Stairs Stair activity did not occur: Safety/medical concerns        Walk up/down 1 step activity Walk up/down 1 step or curb (drop down) activity did not occur: Safety/medical concerns      Walk up/down 4 steps activity Walk up/down 4 steps activity did not occur: Safety/medical concerns      Walk up/down 12 steps activity Walk up/down 12 steps activity did not occur: Safety/medical concerns      Pick up small objects from floor Pick up small object from the floor (from standing  position) activity did not occur: Safety/medical concerns      Wheelchair Is the patient using a wheelchair?: Yes Type of Wheelchair: Manual   Wheelchair assist level: Minimal Assistance - Patient > 75% Max wheelchair distance: 80  Wheel 50 feet with 2 turns activity   Assist Level: Minimal Assistance - Patient > 75%  Wheel 150 feet activity   Assist Level: Moderate Assistance - Patient 50 - 74%    Refer to Care Plan for Long Term Goals  SHORT TERM GOAL WEEK 1 PT Short Term Goal 1 (Week 1): STG = LTGs 2/2 ELOS.  Recommendations for other services: None   Skilled Therapeutic Intervention Evaluation completed (see details above and below) with education on PT POC and goals and individual treatment initiated with focus on  strengthening, transfers, education, improved endurance.  Pt presents supine in bed and agreeable to therapy.  Bed flattened to simulate home, supervision for sup to sit and then to EOB.  Pt transfers sit to stand w/ min A and slightly elevated bed height for home simulation.  Pt performed SPT bed > w/c and min A, verbal cues for UE use.  Platform adjusted for height.  Pt wheeled x 80' w/ R extremities and min A for safe negotiation avoiding Left side.  Pt performed sit to stand and SPT w/c <> car w/ min A, but improved R foot "clearance".  Pt unable to bring legs into car 2/2 pain.  Pt performed LE there ex 3 x 10, LAQ, hip flexion and AP.  Pt educated on there ex when in bed.  Pt returned to room and performed SPT w/ Platform walker and min .  Pt states increased fatigue and required min A for LLE into bed.  Pt bridged to center of bed.  Bed alarm on and all needs in reach.    Mobility Bed Mobility  Bed Mobility: Supine to Sit;Sit to Supine Supine to Sit: Supervision/Verbal cueing Sit to Supine: Minimal Assistance - Patient > 75% Transfers Transfers: Sit to Stand;Stand to Sit;Stand Pivot Transfers Sit to Stand: Minimal Assistance - Patient > 75% Stand to Sit:  Minimal Assistance - Patient > 75% Stand Pivot Transfers: Minimal Assistance - Patient > 75% Stand Pivot Transfer Details: Verbal cues for technique;Verbal cues for precautions/safety;Verbal cues for safe use of DME/AE;Verbal cues for sequencing Stand Pivot Transfer Details (indicate cue type and reason): use of UES for improved "hopping" Transfer (Assistive device): Left platform walker Locomotion  Stairs / Additional Locomotion Stairs: No Wheelchair Mobility Wheelchair Mobility: Yes Wheelchair Assistance: Minimal assistance - Patient >75% (to maintain straight line motion.) Wheelchair Propulsion: Right lower extremity;Right upper extremity Wheelchair Parts Management: Needs assistance Distance: 80   Discharge Criteria: Patient will be discharged from PT if patient refuses treatment 3 consecutive times without medical reason, if treatment goals not met, if there is a change in medical status, if patient makes no progress towards goals or if patient is discharged from hospital.  The above assessment, treatment plan, treatment alternatives and goals were discussed and mutually agreed upon: by patient and by family  Ladoris Gene 06/27/2022, 12:21 PM

## 2022-06-27 NOTE — Progress Notes (Signed)
Inpatient [facility] Individual Statement of Services  Patient Name:  Megan Walter  Date:  06/27/2022  Welcome to the [facility].  Our goal is to provide you with an individualized program based on your diagnosis and situation, designed to meet your specific needs.  With this comprehensive rehabilitation program, you will be expected to participate in at least 3 hours of rehabilitation therapies Monday-Friday, with modified therapy programming on the weekends.  Your rehabilitation program will include the following services:  Physical Therapy (PT), Occupational Therapy (OT), Speech Therapy (ST), 24 hour per day rehabilitation nursing, Therapeutic Recreaction (TR), Neuropsychology, Care Coordinator, Rehabilitation Medicine, Nutrition Services, Pharmacy Services, and Other  Weekly team conferences will be held on Wednesdays to discuss your progress.  Your Inpatient Rehabilitation Care Coordinator will talk with you frequently to get your input and to update you on team discussions.  Team conferences with you and your family in attendance may also be held.  Expected length of stay: 10-14 Days  Overall anticipated outcome: Min A-Supervision Level   Depending on your progress and recovery, your program may change. Your Inpatient Rehabilitation Care Coordinator will coordinate services and will keep you informed of any changes. Your Inpatient Rehabilitation Care Coordinator's name and contact numbers are listed  below.  The following services may also be recommended but are not provided by the [facility]:   [place] will be made to provide these services after discharge if needed.  Arrangements include referral to agencies that provide these services.  Your insurance has been verified to be:   [employer] Your primary doctor is:  Elmo Putt, DO  Pertinent  information will be shared with your doctor and your insurance company.  Inpatient Rehabilitation Care Coordinator:  Erlene Quan, [place] or [phone]  Information discussed with and copy given to patient by: Dyanne Iha, 06/27/2022, 10:31 AM

## 2022-06-27 NOTE — Progress Notes (Signed)
PROGRESS NOTE   Subjective/Complaints: No new complaints this morning Moving bowels well Denies pain Daughter outside room  ROS: pain is well controlled   Objective:   No results found. Recent Labs    06/26/22 0701 06/27/22 0740  WBC 6.5 6.4  HGB 10.6* 11.1*  HCT 33.5* 33.3*  PLT 381 418*   Recent Labs    06/26/22 0701 06/27/22 0740  NA 139 137  K 3.9 3.3*  CL 106 103  CO2 24 29  GLUCOSE 107* 121*  BUN 12 11  CREATININE 0.66 0.83  CALCIUM 8.7* 8.8*    Intake/Output Summary (Last 24 hours) at 06/27/2022 1051 Last data filed at 06/26/2022 2301 Gross per 24 hour  Intake --  Output 200 ml  Net -200 ml        Physical Exam: Vital Signs Blood pressure (!) 145/70, pulse 87, temperature 98.2 F (36.8 C), resp. rate 19, height '5\' 5"'$  (1.651 m), weight 116.8 kg, SpO2 99 %. Gen: no distress, normal appearing HEENT: oral mucosa pink and moist, NCAT Cardio: Reg rate Chest: normal effort, normal rate of breathing Abd: soft, non-distended Ext: no edema Psych: pleasant, normal affect Skin: Splint left wrist--NV intact. Left knee and shin with incisions - C/D/I , please see image Neuro: Alert and oriented x 4, follows commands, cranial nerves II through XII intact, normal speech and language Strength 5 out of 5 in right upper extremity and right lower extremity Strength 5 out of 5 left shoulder abduction, at least 4 out of 5 elbow flexion extension, 3 out of 5 finger flexion and extension Strength 2-3 out of 5 proximal left lower extremity, strength at least 3 out of 5 distal left lower extremity MMT-limited by weightbearing restrictions Sensation to light touch intact in all 4 extremities however sensation decreased along right anterior shin-patient reports this is from a prior surgery Musculoskeletal: Appropriately tender left lower extremity Left lower extremity pain with movement at the hip and knee     Assessment/Plan: 1. Functional deficits which require 3+ hours per day of interdisciplinary therapy in a comprehensive inpatient rehab setting. Physiatrist is providing close team supervision and 24 hour management of active medical problems listed below. Physiatrist and rehab team continue to assess barriers to discharge/monitor patient progress toward functional and medical goals  Care Tool:  Bathing    Body parts bathed by patient: Right arm, Left arm, Left upper leg, Right lower leg, Chest, Abdomen, Left lower leg, Front perineal area, Face, Right upper leg   Body parts bathed by helper: Buttocks     Bathing assist Assist Level: Minimal Assistance - Patient > 75%     Upper Body Dressing/Undressing Upper body dressing   What is the patient wearing?: Pull over shirt    Upper body assist Assist Level: Set up assist    Lower Body Dressing/Undressing Lower body dressing      What is the patient wearing?: Underwear/pull up, Pants     Lower body assist Assist for lower body dressing: Moderate Assistance - Patient 50 - 74%     Toileting Toileting    Toileting assist Assist for toileting: Minimal Assistance - Patient > 75%  Transfers Chair/bed transfer  Transfers assist     Chair/bed transfer assist level: Minimal Assistance - Patient > 75%     Locomotion Ambulation   Ambulation assist              Walk 10 feet activity   Assist           Walk 50 feet activity   Assist           Walk 150 feet activity   Assist           Walk 10 feet on uneven surface  activity   Assist           Wheelchair     Assist               Wheelchair 50 feet with 2 turns activity    Assist            Wheelchair 150 feet activity     Assist          Blood pressure (!) 145/70, pulse 87, temperature 98.2 F (36.8 C), resp. rate 19, height '5\' 5"'$  (1.651 m), weight 116.8 kg, SpO2 99 %.  Medical Problem List and  Plan: 1. Functional deficits secondary to trauma after a fall with left distal radius fracture, ulnar styloid process fracture,  left proximal fibular shaft fracture and left tibial plateau fracture             -patient may not shower             -ELOS/Goals: 10 to 14 days PT/OT min a to supervision             -Admit to CIR, PT OT  2.  Antithrombotics: -DVT/anticoagulation:  Pharmaceutical: Eliquis 0.5 mg twice daily for 30 days             -antiplatelet therapy: N/A 3. Pain Management:  Robaxin /Hydrocodone prn.  4. Mood/Behavior/Sleep: LCSW to follow for evaluation and support.              -antipsychotic agents: N/A.  5. Neuropsych/cognition: This patient is capable of making decisions on her own behalf. 6. Skin/Wound Care: Routine pressure relief measures. Check lytes in am.  7. Fluids/Electrolytes/Nutrition: Monitor I/O. Check CMET in am.  8. Left tibial plateau Fx s/p ORIF: NWB LLE 9. Distal radius/ulna styloid Fx: NWB wrist and WBAT thru left wrist 10. Vitamin D deficiency: Vit D-17.18 and started on ergocalciferol on 02/26 11. ABLA: HGB 10.6 06/26/22. Recheck in am--improving.  12. Hypoalbuminemia: Alb-2.7. Will add protein supplements. Encouraged choosing high protein foods 13. 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 Dietary counseling.  14.  Rhabdomyolysis improved with IV hydration 15.  Elevated bilirubin.  Appears to be improving.  Reviewed 3/5 and is stable 16. Hypokalemia: 29mq klor ordered 3/5 17. Hyperglycemia: add on hemoglobin A1c   LOS: 1 days A FACE TO FACE EVALUATION WAS PERFORMED  KClide DeutscherRaulkar 06/27/2022, 10:51 AM

## 2022-06-28 DIAGNOSIS — T1490XA Injury, unspecified, initial encounter: Secondary | ICD-10-CM | POA: Diagnosis not present

## 2022-06-28 LAB — HEMOGLOBIN A1C
Hgb A1c MFr Bld: 5.5 % (ref 4.8–5.6)
Mean Plasma Glucose: 111 mg/dL

## 2022-06-28 LAB — BASIC METABOLIC PANEL
Anion gap: 12 (ref 5–15)
BUN: 14 mg/dL (ref 8–23)
CO2: 24 mmol/L (ref 22–32)
Calcium: 9.1 mg/dL (ref 8.9–10.3)
Chloride: 103 mmol/L (ref 98–111)
Creatinine, Ser: 0.63 mg/dL (ref 0.44–1.00)
GFR, Estimated: >60 mL/min (ref >60)
Glucose, Bld: 100 mg/dL — ABNORMAL HIGH (ref 70–99)
Potassium: 3.7 mmol/L (ref 3.5–5.1)
Sodium: 139 mmol/L (ref 135–145)

## 2022-06-28 NOTE — Progress Notes (Signed)
Inpatient Rehabilitation Care Coordinator Discharge Note   Patient Details  Name: Megan Walter MRN: [id number] Date of Birth: 10/23/1945   Discharge location: Home with daughters and spouse  Length of Stay: 4 Days  Discharge activity level: Sup/cga  Home/community participation: Spouse and daughters  Patient response GB:EEFEOF Literacy - How often do you need to have someone help you when you read instructions, pamphlets, or other written material from your doctor or pharmacy?: Never  Patient response HQ:RFXJOI Isolation - How often do you feel lonely or isolated from those around you?: Never  Services provided included: MD, RD, PT, OT, SLP, RN, CM, TR, Pharmacy, Neuropsych, SW  Financial Services:  Charity fundraiser Utilized: [place] offered to/list presented to: Patient  Follow-up services arranged:  Home Health, DME Home Health Agency: [facility]- PT OT    DME : [employer], Rolling, and Bedside Pt to get rolling walker on own due to not covered by insurance    Patient response to transportation need: Is the patient able to respond to transportation needs?: Yes In the past 12 months, has lack of transportation kept you from medical appointments or from getting medications?: No In the past 12 months, has lack of transportation kept you from meetings, work, or from getting things needed for daily living?: No    Comments (or additional information):  Patient/Family verbalized understanding of follow-up arrangements:  Yes  Individual responsible for coordination of the follow-up plan: Charlie,[phone]  Confirmed correct DME delivered: Dyanne Iha 06/28/2022    Dyanne Iha

## 2022-06-28 NOTE — Progress Notes (Signed)
Occupational Therapy Session Note  Patient Details  Name: NIL ANSTETT MRN: [id number] Date of Birth: 04-13-1946  Today's Date: 06/28/2022 OT Individual Time: [id number] OT Individual Time Calculation (min): 45 min    Short Term Goals: Week 1:  OT Short Term Goal 1 (Week 1): STG= LTG d/t ELOS  Skilled Therapeutic Interventions/Progress Updates:  Skilled OT intervention completed with focus on ADL retraining, DC planning and DME education. Pt received upright in bed, agreeable to session. Un-rated pain reported in L calf; pre-medicated. Repositioning with leg rests offered and rest breaks provided for pain reduction.  Pt agreeable to get dressed. Completed supervision bed mobility to EOB with use of bed rails. Mod A needed for donning bra, with education provided on front clasping bra for ease with limited use of LUE. Able to donn shirt with supervision. Threaded pants with supervision for RLE however min A for LLE; cues provided for hemi-technique for efficiency. CGA sit > stand using L platform RW, with CGA also needed for donning pants over hips. Stand pivot to w/c with pt shifting on R foot, good adherence to NWB status on LLE. Pt reported she feels instability/nervousness with hopping on R leg due to prior surgery in the lower leg therefore prefers to shift on the foot. Discussed benefits of this for maintaining integrity of the contralateral leg.   OT made adjustments to the elevating leg rest for LLE to prevent WB through the foot plate. Pt remained seated in w/c, with chair alarm on/activated, and with all needs in reach at end of session.   Therapy Documentation Precautions:  Precautions Precautions: Fall Restrictions Weight Bearing Restrictions: Yes LUE Weight Bearing: Weight bear through elbow only LLE Weight Bearing: Non weight bearing    Therapy/Group: Individual Therapy  Blase Mess, MS, OTR/L  06/28/2022, 11:40 AM

## 2022-06-28 NOTE — Progress Notes (Signed)
PROGRESS NOTE   Subjective/Complaints: C/o mild pain and asked for tylenol, was unable to sleep well last night due to trouble getting comfortable in her bed, would like a trapeze bar   ROS: pain is well controlled, +insomnia   Objective:   No results found. Recent Labs    06/26/22 0701 06/27/22 0740  WBC 6.5 6.4  HGB 10.6* 11.1*  HCT 33.5* 33.3*  PLT 381 418*   Recent Labs    06/26/22 0701 06/27/22 0740  NA 139 137  K 3.9 3.3*  CL 106 103  CO2 24 29  GLUCOSE 107* 121*  BUN 12 11  CREATININE 0.66 0.83  CALCIUM 8.7* 8.8*    Intake/Output Summary (Last 24 hours) at 06/28/2022 1122 Last data filed at 06/28/2022 0505 Gross per 24 hour  Intake 476 ml  Output 600 ml  Net -124 ml        Physical Exam: Vital Signs Blood pressure 139/65, pulse 88, temperature 98.2 F (36.8 C), temperature source Oral, resp. rate 16, height '5\' 5"'$  (1.651 m), weight 116.8 kg, SpO2 100 %. Gen: no distress, normal appearing, BMI 42.85 HEENT: oral mucosa pink and moist, NCAT Cardio: Reg rate Chest: normal effort, normal rate of breathing Abd: soft, non-distended Ext: no edema Psych: pleasant, normal affect Skin: Splint left wrist--NV intact. Left knee and shin with incisions - C/D/I , please see image Neuro: Alert and oriented x 4, follows commands, cranial nerves II through XII intact, normal speech and language Strength 5 out of 5 in right upper extremity and right lower extremity Strength 5 out of 5 left shoulder abduction, at least 4 out of 5 elbow flexion extension, 3 out of 5 finger flexion and extension Strength 2-3 out of 5 proximal left lower extremity, strength at least 3 out of 5 distal left lower extremity MMT-limited by weightbearing restrictions Sensation to light touch intact in all 4 extremities however sensation decreased along right anterior shin-patient reports this is from a prior surgery Musculoskeletal:  Appropriately tender left lower extremity Left lower extremity pain with movement at the hip and knee    Assessment/Plan: 1. Functional deficits which require 3+ hours per day of interdisciplinary therapy in a comprehensive inpatient rehab setting. Physiatrist is providing close team supervision and 24 hour management of active medical problems listed below. Physiatrist and rehab team continue to assess barriers to discharge/monitor patient progress toward functional and medical goals  Care Tool:  Bathing    Body parts bathed by patient: Right arm, Left arm, Left upper leg, Right lower leg, Chest, Abdomen, Left lower leg, Front perineal area, Face, Right upper leg   Body parts bathed by helper: Buttocks     Bathing assist Assist Level: Minimal Assistance - Patient > 75%     Upper Body Dressing/Undressing Upper body dressing   What is the patient wearing?: Pull over shirt    Upper body assist Assist Level: Set up assist    Lower Body Dressing/Undressing Lower body dressing      What is the patient wearing?: Underwear/pull up, Pants     Lower body assist Assist for lower body dressing: Moderate Assistance - Patient 50 - 74%  Toileting Toileting    Toileting assist Assist for toileting: Minimal Assistance - Patient > 75%     Transfers Chair/bed transfer  Transfers assist     Chair/bed transfer assist level: Minimal Assistance - Patient > 75%     Locomotion Ambulation   Ambulation assist   Ambulation activity did not occur: Safety/medical concerns  Assist level: Moderate Assistance - Patient 50 - 74% Assistive device: Walker-platform Max distance: 10   Walk 10 feet activity   Assist  Walk 10 feet activity did not occur: Safety/medical concerns  Assist level: Moderate Assistance - Patient - 50 - 74% Assistive device: Walker-platform   Walk 50 feet activity   Assist Walk 50 feet with 2 turns activity did not occur: Safety/medical concerns          Walk 150 feet activity   Assist Walk 150 feet activity did not occur: Safety/medical concerns         Walk 10 feet on uneven surface  activity   Assist Walk 10 feet on uneven surfaces activity did not occur: Safety/medical concerns         Wheelchair     Assist Is the patient using a wheelchair?: Yes Type of Wheelchair: Manual    Wheelchair assist level: Minimal Assistance - Patient > 75% Max wheelchair distance: 80    Wheelchair 50 feet with 2 turns activity    Assist        Assist Level: Minimal Assistance - Patient > 75%   Wheelchair 150 feet activity     Assist      Assist Level: Moderate Assistance - Patient 50 - 74%   Blood pressure 139/65, pulse 88, temperature 98.2 F (36.8 C), temperature source Oral, resp. rate 16, height '5\' 5"'$  (1.651 m), weight 116.8 kg, SpO2 100 %.  Medical Problem List and Plan: 1. Functional deficits secondary to trauma after a fall with left distal radius fracture, ulnar styloid process fracture,  left proximal fibular shaft fracture and left tibial plateau fracture             -patient may not shower             -ELOS/Goals: 10 to 14 days PT/OT min a to supervision             -Admit to CIR, PT OT  2.  Antithrombotics: -DVT/anticoagulation:  Pharmaceutical: Eliquis 0.5 mg twice daily for 30 days             -antiplatelet therapy: N/A 3. Pain Management:  Robaxin /Hydrocodone prn.  4. Insomnia: due to difficulty positioning herself at night, trapeze bar ordered 5. Neuropsych/cognition: This patient is capable of making decisions on her own behalf. 6. Skin/Wound Care: Routine pressure relief measures. Check lytes in am.  7. Fluids/Electrolytes/Nutrition: Monitor I/O. Check CMET in am.  8. Left tibial plateau Fx s/p ORIF: NWB LLE 9. Distal radius/ulna styloid Fx: NWB wrist and WBAT thru left wrist 10. Vitamin D deficiency: Vit D-17.18 and started on ergocalciferol on 02/26 11. ABLA: HGB 10.6 06/26/22.  Recheck in am--improving.  12. Hypoalbuminemia: Alb-2.7. Will add protein supplements. Encouraged choosing high protein foods 13. Morbid Obesity. BMI 40.98-->42.85. Dietary counseling.  14.  Rhabdomyolysis improved with IV hydration 15.  Elevated bilirubin.  Appears to be improving.  Reviewed 3/5 and is stable 16. Hypokalemia: 23mq klor ordered 3/5, repeat BMP ordered 3/6.  17. Hyperglycemia: HgbA1c reviewed from 3/5 and is 5.5   LOS: 2 days A FACE TO FACE EVALUATION WAS PERFORMED  Niamh Rada P Isaid Salvia 06/28/2022, 11:22 AM

## 2022-06-28 NOTE — Progress Notes (Signed)
Occupational Therapy Session Note  Patient Details  Name: Megan Walter MRN: [id number] Date of Birth: [birth year]  Today's Date: 06/28/2022 OT Individual Time: 0917-1000 OT Individual Time Calculation (min): 43 min    Short Term Goals: Week 1:  OT Short Term Goal 1 (Week 1): STG= LTG d/t ELOS  Skilled Therapeutic Interventions/Progress Updates:    Patient received seated in wheelchair - reporting increased pain in left calf from leg rest.  Removed leg rest.  Practiced toilet transfers both squat pivot (multi squat) and stand pivot with platform RW.  Patient prefers stand pivot although needed multiple attempts to get up.  Explained she could use squat pivot if fatigued - patient agreed.  Assisted back to bed and ice applied to left shin.  Personal items in reach.   Therapy Documentation Precautions:  Precautions Precautions: Fall Restrictions Weight Bearing Restrictions: Yes LUE Weight Bearing: Weight bear through elbow only LLE Weight Bearing: Non weight bearing   Pain:  5/10 LLE Tylenol brought by nursing      Therapy/Group: Individual Therapy  Mariah Milling 06/28/2022, 11:28 AM

## 2022-06-28 NOTE — Progress Notes (Signed)
Physical Therapy Session Note  Patient Details  Name: Megan Walter MRN: [id number] Date of Birth: 1945/12/25  Today's Date: 06/28/2022 PT Individual Time: 1420-1500 PT Individual Time Calculation (min): 40 min   Short Term Goals: Week 1:  PT Short Term Goal 1 (Week 1): STG = LTGs 2/2 ELOS.  Skilled Therapeutic Interventions/Progress Updates:    Pt received supine in bed awake and agreeable to therapy session. Supine>sitting R EOB, HOB partially elevated, supervision/mod-I. Sit>stand slightly elevated EOB>L PFRW with CGA - pt rests L foot on ground but states she is not putting weight down through it. L stand pivot transfer EOB>w/c using L PFRW with CGA and increased time/effort to hop backwards towards seat but able to do so when given increased time and cuing for safety and encouragement to continue to make small hops.   Pt would likely benefit from having smooth stoppers on back RW legs rather than the rubber ones to allow her to slide AD backwards during transfers more easily due to increased weight of AD caused by platform being added.   Discussed pt needing assistance with L LE elevating leg rest at home due to her inability to use L hand to pick it up and manage it - pt reports she likely will perform w/c propulsion around house without leg rests on.   Performed R hemi-propulsion ~142f towards main therapy gym with supervision - education on using LE to stear her - pt needs gait belt to assist with keeping L LE on elevating leg rest during propulsion as it tends to slide off towards middle - educated pt on using a wider strap at home to disperse the pressure to avoid skin irritation - pt verbalizes understanding.  Stand pivot w/c<>EOM using L PFRW with CGA for steadying/safety as described above.   Pt declines participating in gait training at this time stating it made her too fatigued yesterday and pt is content with plan to be at a wheelchair mobility level at D/C until her L LE WBing  restrictions are upgraded.  Pt reports L calf tightness - performed seated L LE PF stretch using gait belt and resting her heel on a step 2x 1 minute.   Standing L LE strengthening and dynamic standing balance task with B UE support on L PFRW with CGA while tapping L foot on 4" step 2x 10 reps.  Transported back to room. Stand pivot to EOB using L PFRW with CGA. Sit>supine with pt using B UEs to assist with L LE management up onto bed and moving it over in the bed - supervision/mod-I. Pt left supine in bed with needs in reach and bed alarm on.   Therapy Documentation Precautions:  Precautions Precautions: Fall Restrictions Weight Bearing Restrictions: Yes LUE Weight Bearing: Weight bear through elbow only LLE Weight Bearing: Non weight bearing   Pain:  Reports pain along anterior tib/fib area around location of the fracture - premedicated - modified interventions for pain management as needed.    Therapy/Group: Individual Therapy  CTawana Scale, PT, DPT, NCS, CSRS 06/28/2022, 12:18 PM

## 2022-06-28 NOTE — Progress Notes (Addendum)
Patient ID: Megan Walter, female   DOB: May 25, 1945, 77 y.o.   MRN: [id number]  [facility] referral sent to [facility].  Patient approved for PT/OT Orders sent.

## 2022-06-28 NOTE — Progress Notes (Signed)
Patient ID: Megan Walter, female   DOB: 1945/08/17, 77 y.o.   MRN: [id number]  Patient d/c moved to Friday.

## 2022-06-28 NOTE — Progress Notes (Signed)
Patient ID: Megan Walter, female   DOB: 1945-07-26, 77 y.o.   MRN: [id number]  Team Conference Report to Patient/Family  Team Conference discussion was reviewed with the patient and caregiver, including goals, any changes in plan of care and target discharge date.  Patient and caregiver express understanding and are in agreement.  The patient has a target discharge date of 07/01/22.  Sw met with patient and provided team conference updates. Patient excited about d/c and will have daughters and spouse at home supervising and assisting. Patient will require a RW, WC, and BSC at d/c. No additional questions or concerns.   Dyanne Iha 06/28/2022, 1:27 PM

## 2022-06-28 NOTE — Progress Notes (Signed)
Patient ID: Megan Walter, female   DOB: 1945-10-26, 77 y.o.   MRN: [id number]  Bedside Commode, wheelchair and rolling walker ordered through [employer].

## 2022-06-28 NOTE — Progress Notes (Signed)
Physical Therapy Session Note  Patient Details  Name: Megan Walter MRN: [id number] Date of Birth: 22-Apr-1946  Today's Date: 06/28/2022 PT Individual Time: 1330-1411 PT Individual Time Calculation (min): 41 min   Short Term Goals: Week 1:  PT Short Term Goal 1 (Week 1): STG = LTGs 2/2 ELOS.  Skilled Therapeutic Interventions/Progress Updates:   Received pt sitting in recliner, pt agreeable to PT treatment, and reported pain 4/10 in L lower leg (premedicated). Session with emphasis on functional mobility/transfers, generalized strengthening and endurance, dynamic standing balance/coordination, and short distance ambulation. Pt requesting to move D/C date up to Friday for transportation reasons - secure chatted treatment team and confirmed D/C on 3/8. Stood from recliner with L PFRW and min A and ambulated 21f with L PFRW and CGA to WC. Dicussed equipment for D/C with recommendation for 20x16 manual WC with L elevating legrest and L PFRW. Pt reported her 2 adult daughters will be able to provide 24/7 assist as needed upon D/C since her husband is an amputee and unable to physically assist. Pt performed WC mobility 569fx 2 trials using RUE/RLE and supervision to/from dayroom.   Stood with L PFRW and CGA and picked up tissue box using reacher and CGA for balance. Pt politely declined any further "hopping" to avoid increased strain on R knee. Performed the following seated exercises with emphasis on LE strength/ROM: -LAQ 2x10 bilaterally with 2.5lb ankle weight on RLE and 1.5lb ankle weight on LLE -hip flexion 2x10 bilaterally with 2.5lb ankle weight on RLE and 1.5lb ankle weight on LLE -hip adduction ball squeezes 10x5 second isometric hold Returned to room and requested to return to bed. Transferred WC<>bed stand<>pivot with L PFRW and CGA and sit<>supine with supervision. Concluded session with pt supine in bed, needs within reach, and bed alarm on.   Therapy Documentation Precautions:   Precautions Precautions: Fall Restrictions Weight Bearing Restrictions: Yes LUE Weight Bearing: Weight bear through elbow only LLE Weight Bearing: Non weight bearing  Therapy/Group: Individual Therapy AnAlfonse AlpersT, DPT  06/28/2022, 7:18 AM

## 2022-06-28 NOTE — Patient Care Conference (Signed)
Inpatient RehabilitationTeam Conference and Plan of Care Update Date: 06/28/2022   Time: 11:31 AM    Patient Name: Megan Walter      Medical Record Number: [id number]  Date of Birth: [birth year] Sex: Female         Room/Bed: 4W13C/4W13C-01 Payor Info: Payor: [employer] / Plan: [employer] MEDICARE / Product Type: *No Product type* /    Admit Date/Time:  06/26/2022  4:55 PM  Primary Diagnosis:  Trauma  Hospital Problems: Principal Problem:   Trauma    Expected Discharge Date: Expected Discharge Date: 06/30/22  Team Members Present: Physician leading conference: Dr. Leeroy Cha Social Worker Present: Erlene Quan, BSW Nurse Present: Dorien Chihuahua, RN PT Present: Becky Sax, PT OT Present: Jennefer Bravo, OT PPS Coordinator present : Gunnar Fusi, SLP     Current Status/Progress Goal Weekly Team Focus  Bowel/Bladder   Pt is continent B/B  LBM 06/27/22   Pt will mantain normal B/B pattern   Toilet qshift/prn    Swallow/Nutrition/ Hydration               ADL's   CGA overall transfers, min A LB ADLs   mod I overall, (S) shower transfer   ADLs, transfers, strengthening, d/c planning    Mobility   bed mobility min A, transfers with L PFRW min A, gait 48f with L PFRW   supervision, min A gait  functional mobility/transfers, generalized strengthening and endurance, dynamic standing balance/coordination, gait training, D/C planning    Communication                Safety/Cognition/ Behavioral Observations               Pain   Verbalized pain to surgical incision. Pain well managed with current prn pain medication   Pt will be free from pain   Assess pain qshift/prn and administer prn pain medication per orders and follow up effectiveness    Skin   Impaired skin intergrity due ro surgery   Pt will be free from infection  Assess skin for infection and promote healing      Discharge Planning:  Discharging home with spouse who  works. Daughters live nearby and able to assist patient up to [place]. 2 steps to enter home   Team Discussion: Patient with insomnia and constipation post trauma.  Repletion of potassium per MD.  Patient on target to meet rehab goals: yes, currently needs min assist for bed mobility.  Able to complete stand pivot with CGA and ambulate up to 10' using a platform walker and min assist.  CGA overall for self care and min assist for lower body dressing. Goals for discharge set for Mod I overall and supervision for showering.   *See Care Plan and progress notes for long and short-term goals.   Revisions to Treatment Plan:  Trapeze bar and frame for bed   Teaching Needs: Safety, medications, skin care, transfers, toileting, etc. Nonweightbearing status left lower limb, left upper extremity weightbearing through elbow only  Eliquis for 30 days and Vitamin D for 8 weeks    Current Barriers to Discharge: Decreased caregiver support and Home enviroment access/layout  Possible Resolutions to Barriers: Family education HH follow up services DME: W/C, Platform [facility]    Medical Summary Current Status: morbid obesity, pain, hyperbilirubinemia, rhabdomyolysis, insomnia, hypokalemia, vitamin D deficiency  Barriers to Discharge: Medical stability;Morbid Obesity  Barriers to Discharge Comments: morbid obesity, pain, hyperbilirubinemia, rhabdomyolysis, insomnia, hypokalemia,  vitamin D deficiency Possible Resolutions to [employer]: provide dietary education, continue prn tylenol and opioid, monitoring bilirubin and potassium, ordered trapeze bar and grounds pass, continue ergocalciferol   Continued Need for Acute Rehabilitation Level of Care: The patient requires daily medical management by a physician with specialized training in physical medicine and rehabilitation for the following reasons: Direction of a multidisciplinary physical rehabilitation program to maximize  functional independence : Yes Medical management of patient stability for increased activity during participation in an intensive rehabilitation regime.: Yes Analysis of laboratory values and/or radiology reports with any subsequent need for medication adjustment and/or medical intervention. : Yes   I attest that I was present, lead the team conference, and concur with the assessment and plan of the team.   Dorien Chihuahua B 06/28/2022, 1:35 PM

## 2022-06-28 NOTE — Progress Notes (Signed)
Physical Therapy Session Note  Patient Details  Name: Megan Walter MRN: [id number] Date of Birth: 08-08-1945  Today's Date: 06/28/2022 PT Individual Time: 1130-1200 PT Individual Time Calculation (min): 30 min   Short Term Goals: Week 1:  PT Short Term Goal 1 (Week 1): STG = LTGs 2/2 ELOS.  Skilled Therapeutic Interventions/Progress Updates:      Pt sitting EOB to start - agreeable to therapy session. Reports "tolerable" pain and denies any pain intervention.   Sit<>stand from raised EOB to [place] with CGA - pt compliant with WB restrictions during transfer. Stand<>pivot to her w/c but difficulty hopping/pivoting backwards to approach chair - needed assistance in bringing w/c to her.  Transported to main rehab gym and assisted on to mat table via similar transfer. minA for sit>supine on flat surface, for LLE support.   Mat level there-ex for LLE strengthening and ROM - hip abd/add, heel slides, SAQ w/ bolster, and ankle pumps. AAROM for all but ankle pumps for support to limb. 1x12 each. Returned to her w/c and then assisted to room. Stand<>pivot to the recliner using PFRW and CGA - again needing the recliner brought to her as she had difficulty hopping backwards. BLE elevated, call bell in lap, all needs met at end of session.    Therapy Documentation Precautions:  Precautions Precautions: Fall Restrictions Weight Bearing Restrictions: Yes LUE Weight Bearing: Weight bear through elbow only LLE Weight Bearing: Non weight bearing General:     Therapy/Group: Individual Therapy  Megan Walter P Megan Walter PT 06/28/2022, 8:01 AM

## 2022-06-28 NOTE — Discharge Instructions (Addendum)
Inpatient Rehab Discharge Instructions  Megan Walter Discharge date and time:  06/30/22  Activities/Precautions/ Functional Status: Activity: no lifting, driving, or strenuous exercise till cleared by MD Diet: regular diet Wound Care: keep wound clean and dry   Functional status:  ___ No restrictions     ___ Walk up steps independently _X__ 24/7 supervision/assistance   ___ Walk up steps with assistance ___ Intermittent supervision/assistance  ___ Bathe/dress independently ___ Walk with walker     _X__ Bathe/dress with assistance ___ Walk Independently    ___ Shower independently ___ Walk with assistance    ___ Shower with assistance _X__ No alcohol     ___ Return to work/school ________  Special Instructions: No weight on left wrist and No weight on left leg till cleared by Dr. Doreatha Martin.   COMMUNITY REFERRALS UPON DISCHARGE:    Home Health:   PT     OT                    Agency: [facility] Equipment/Items Ordered: Wheelchair and Bedside Commode-to get rolling walker with left platform on her own                                                 Agency/Supplier: [employer] [phone]    My questions have been answered and I understand these instructions. I will adhere to these goals and the provided educational materials after my discharge from the hospital.  Patient/Caregiver Signature _______________________________ Date __________  Clinician Signature _______________________________________ Date __________  Please bring this form and your medication list with you to all your follow-up doctor's appointments.     [facility]th[facility] Trauma Service Discharge Instructions   General Discharge Instructions  WEIGHT BEARING STATUS:Weightbearing through left elbow. Non-weightbearing through left wrist. Non-weightbearing left lower extremity  RANGE OF MOTION/ACTIVITY: Left leg- Unrestricted range of motion Left arm -  Ok to start gentle wrist range of  motion out of the brace  Wound Care: Incisions can be left open to air if there is no drainage. Once the incision is completely dry and without drainage, it may be left open to air out.  OK to begin showering. Clean incision gently with soap and water.  DVT/PE prophylaxis:  Eliquis 2.5 mg  Diet: as you were eating previously.  Can use over the counter stool softeners and bowel preparations, such as Miralax, to help with bowel movements.  Narcotics can be constipating.  Be sure to drink plenty of fluids  PAIN MEDICATION USE AND EXPECTATIONS  You have likely been given narcotic medications to help control your pain.  After a traumatic event that results in an fracture (broken bone) with or without surgery, it is ok to use narcotic pain medications to help control one's pain.  We understand that everyone responds to pain differently and each individual patient will be evaluated on a regular basis for the continued need for narcotic medications. Ideally, narcotic medication use should last no more than 6-8 weeks (coinciding with fracture healing).   As a patient it is your responsibility as well to monitor narcotic medication use and report the amount and frequency you use these medications when you come to your office visit.   We would also advise that if you are using narcotic medications, you should take a dose prior to therapy to maximize you  participation.  IF YOU ARE ON NARCOTIC MEDICATIONS IT IS NOT PERMISSIBLE TO OPERATE A MOTOR VEHICLE (MOTORCYCLE/CAR/TRUCK/MOPED) OR HEAVY MACHINERY DO NOT MIX NARCOTICS WITH OTHER CNS (CENTRAL NERVOUS SYSTEM) DEPRESSANTS SUCH AS ALCOHOL   STOP SMOKING OR USING NICOTINE PRODUCTS!!!!  As discussed nicotine severely impairs your body's ability to heal surgical and traumatic wounds but also impairs bone healing.  Wounds and bone heal by forming microscopic blood vessels (angiogenesis) and nicotine is a vasoconstrictor (essentially, shrinks blood vessels).   Therefore, if vasoconstriction occurs to these microscopic blood vessels they essentially disappear and are unable to deliver necessary nutrients to the healing tissue.  This is one modifiable factor that you can do to dramatically increase your chances of healing your injury.    (This means no smoking, no nicotine gum, patches, etc)  DO NOT USE NONSTEROIDAL ANTI-INFLAMMATORY DRUGS (NSAID'S)  Using products such as Advil (ibuprofen), Aleve (naproxen), Motrin (ibuprofen) for additional pain control during fracture healing can delay and/or prevent the healing response.  If you would like to take over the counter (OTC) medication, Tylenol (acetaminophen) is ok.  However, some narcotic medications that are given for pain control contain acetaminophen as well. Therefore, you should not exceed more than 4000 mg of tylenol in a day if you do not have liver disease.  Also note that there are may OTC medicines, such as cold medicines and allergy medicines that my contain tylenol as well.  If you have any questions about medications and/or interactions please ask your doctor/PA or your pharmacist.      ICE AND ELEVATE INJURED/OPERATIVE EXTREMITY  Using ice and elevating the injured extremity above your heart can help with swelling and pain control.  Icing in a pulsatile fashion, such as 20 minutes on and 20 minutes off, can be followed.    Do not place ice directly on skin. Make sure there is a barrier between to skin and the ice pack.    Using frozen items such as frozen peas works well as the conform nicely to the are that needs to be iced.  USE AN ACE WRAP OR TED HOSE FOR SWELLING CONTROL  In addition to icing and elevation, Ace wraps or TED hose are used to help limit and resolve swelling.  It is recommended to use Ace wraps or TED hose until you are informed to stop.    When using Ace Wraps start the wrapping distally (farthest away from the body) and wrap proximally (closer to the body)   Example: If you  had surgery on your leg or thing and you do not have a splint on, start the ace wrap at the toes and work your way up to the thigh        If you had surgery on your upper extremity and do not have a splint on, start the ace wrap at your fingers and work your way up to the upper arm  IF YOU ARE IN A SPLINT OR CAST DO NOT [place]   If your splint gets wet for any reason please contact the office immediately. You may shower in your splint or cast as long as you keep it dry.  This can be done by wrapping in a cast cover or garbage back (or similar)  Do Not stick any thing down your splint or cast such as pencils, money, or hangers to try and scratch yourself with.  If you feel itchy take benadryl as prescribed on the bottle for itching  CALL THE OFFICE FOR MEDICATION REFILLS OR WITH ANY QUESTIONS/CONCERNS: [phone]   VISIT OUR WEBSITE FOR ADDITIONAL INFORMATION: [email or web address]     Discharge Wound Care Instructions  Do NOT apply any ointments, solutions or lotions to pin sites or surgical wounds.  These prevent needed drainage and even though solutions like hydrogen peroxide kill bacteria, they also damage cells lining the pin sites that help fight infection.  Applying lotions or ointments can keep the wounds moist and can cause them to breakdown and open up as well. This can increase the risk for infection. When in doubt call the office.  If any drainage is noted, use one layer of adaptic or Mepitel, then gauze, Kerlix, and an ace wrap. - These dressing supplies should be available at local medical supply stores [facility], [facility], etc) as well as Management consultant ([facility], [facility], [place], etc)  Once the incision is completely dry and without drainage, it may be left open to air out.  Showering may begin 36-48 hours later.  Cleaning gently with soap and water.

## 2022-06-29 ENCOUNTER — Encounter (HOSPITAL_COMMUNITY): Payer: Self-pay | Admitting: Student

## 2022-06-29 ENCOUNTER — Inpatient Hospital Stay (HOSPITAL_COMMUNITY): Payer: Medicare Other

## 2022-06-29 ENCOUNTER — Other Ambulatory Visit (HOSPITAL_COMMUNITY): Payer: Self-pay

## 2022-06-29 DIAGNOSIS — S82142S Displaced bicondylar fracture of left tibia, sequela: Secondary | ICD-10-CM

## 2022-06-29 DIAGNOSIS — E8809 Other disorders of plasma-protein metabolism, not elsewhere classified: Secondary | ICD-10-CM | POA: Insufficient documentation

## 2022-06-29 DIAGNOSIS — T1490XA Injury, unspecified, initial encounter: Secondary | ICD-10-CM | POA: Diagnosis not present

## 2022-06-29 MED ORDER — SENNOSIDES-DOCUSATE SODIUM 8.6-50 MG PO TABS: 1.0000 | ORAL_TABLET | Freq: Every evening | ORAL | Status: DC | PRN

## 2022-06-29 MED ORDER — MELATONIN 3 MG PO TABS: 3.0000 mg | ORAL_TABLET | Freq: Every day | ORAL | 0 refills | Status: DC

## 2022-06-29 MED ORDER — HYDROCODONE-ACETAMINOPHEN 5-325 MG PO TABS
1.0000 | ORAL_TABLET | Freq: Three times a day (TID) | ORAL | 0 refills | Status: DC | PRN
  Filled 2022-06-29: qty 40, 7d supply, fill #0

## 2022-06-29 MED ORDER — APIXABAN 2.5 MG PO TABS
2.5000 mg | ORAL_TABLET | Freq: Two times a day (BID) | ORAL | 0 refills | Status: DC
  Filled 2022-06-29: qty 60, 30d supply, fill #0

## 2022-06-29 MED ORDER — CALAMINE EX LOTN
1.0000 | TOPICAL_LOTION | Freq: Two times a day (BID) | CUTANEOUS | 0 refills | Status: DC
  Filled 2022-06-29: qty 60, 6d supply, fill #0

## 2022-06-29 MED ORDER — VITAMIN D (ERGOCALCIFEROL) 1.25 MG (50000 UNIT) PO CAPS
50000.0000 [IU] | ORAL_CAPSULE | ORAL | 0 refills | Status: DC
  Filled 2022-06-29: qty 5, 35d supply, fill #0

## 2022-06-29 MED ORDER — METHOCARBAMOL 500 MG PO TABS
500.0000 mg | ORAL_TABLET | Freq: Four times a day (QID) | ORAL | 0 refills | Status: DC | PRN
  Filled 2022-06-29: qty 30, 8d supply, fill #0

## 2022-06-29 NOTE — Plan of Care (Signed)
  Problem: Sit to Stand Goal: LTG:  Patient will perform sit to stand in prep for activites of daily living with assistance level (OT) Description: LTG:  Patient will perform sit to stand in prep for activites of daily living with assistance level (OT) Outcome: Not Met (add Reason) Flowsheets (Taken 06/29/2022 1434) LTG: PT will perform sit to stand in prep for activites of daily living with assistance level: (Unable to meet goal due to pt leaving prior to encouraged ELOS & OT unable to complete thorough skilled intervention) --   Problem: RH Dressing Goal: LTG Patient will perform lower body dressing w/assist (OT) Description: LTG: Patient will perform lower body dressing with assist, with/without cues in positioning using equipment (OT) Outcome: Not Met (add Reason) Flowsheets (Taken 06/29/2022 1434) LTG: Pt will perform lower body dressing with assistance level of: (Unable to meet goal due to pt leaving prior to encouraged ELOS & OT unable to complete thorough skilled intervention) --   Problem: RH Toileting Goal: LTG Patient will perform toileting task (3/3 steps) with assistance level (OT) Description: LTG: Patient will perform toileting task (3/3 steps) with assistance level (OT)  Outcome: Not Met (add Reason) Flowsheets (Taken 06/29/2022 1434) LTG: Pt will perform toileting task (3/3 steps) with assistance level: (Unable to meet goal due to pt leaving prior to encouraged ELOS & OT unable to complete thorough skilled intervention) --   Problem: RH Toilet Transfers Goal: LTG Patient will perform toilet transfers w/assist (OT) Description: LTG: Patient will perform toilet transfers with assist, with/without cues using equipment (OT) Outcome: Not Met (add Reason) Flowsheets (Taken 06/29/2022 1434) LTG: Pt will perform toilet transfers with assistance level of: (Unable to meet goal due to pt leaving prior to encouraged ELOS & OT unable to complete thorough skilled intervention) --   Problem: RH  Tub/Shower Transfers Goal: LTG Patient will perform tub/shower transfers w/assist (OT) Description: LTG: Patient will perform tub/shower transfers with assist, with/without cues using equipment (OT) Outcome: Not Met (add Reason) Flowsheets (Taken 06/29/2022 1434) LTG: Pt will perform tub/shower stall transfers with assistance level of: (Unable to meet goal due to pt leaving prior to encouraged ELOS & OT unable to complete thorough skilled intervention) --   Problem: RH Bathing Goal: LTG Patient will bathe all body parts with assist levels (OT) Description: LTG: Patient will bathe all body parts with assist levels (OT) Outcome: Completed/Met

## 2022-06-29 NOTE — Discharge Summary (Signed)
Physician Discharge Summary  Patient ID: Megan Walter MRN: [id number] DOB/AGE: 1945-06-24 77 y.o.  Admit date: 06/26/2022 Discharge date: 06/30/2022  Discharge Diagnoses:  Principal Problem:   Trauma Active Problems:   Left wrist fracture, sequela   Severe obesity (BMI >= 40) (HCC)   Tibial plateau fracture, left, sequela   Hypoalbuminemia   Acute blood loss anemia   Hypokalemia   Discharged Condition: stable  Significant Diagnostic Studies: DG Wrist 2 Views Left  Result Date: 06/17/2022 CLINICAL DATA:  LEFT wrist fracture, follow-up. EXAM: LEFT WRIST - 2 VIEW COMPARISON:  Plain film of the LEFT wrist dated 06/14/2022 FINDINGS: Interval plate and screw fixation of the distal LEFT radius fracture. Osseous alignment is significantly improved and near anatomic. Expected postsurgical changes within the overlying soft tissues. IMPRESSION: Interval plate and screw fixation of the distal LEFT radius fracture. Osseous alignment is significantly improved. No evidence of surgical complicating feature. Electronically Signed   By: Franki Cabot M.D.   On: 06/17/2022 10:35    Labs:  Basic Metabolic Panel: Recent Labs  Lab 06/24/22 0731 06/26/22 0701 06/27/22 0740 06/28/22 1217  NA 138 139 137 139  K 3.1* 3.9 3.3* 3.7  CL 103 106 103 103  CO2 28 24 29 24   GLUCOSE 104* 107* 121* 100*  BUN 14 12 11 14   CREATININE 0.51 0.66 0.83 0.63  CALCIUM 8.6* 8.7* 8.8* 9.1  MG 2.1 2.2 2.0  --     CBC: Recent Labs  Lab 06/24/22 0731 06/26/22 0701 06/27/22 0740  WBC 6.9 6.5 6.4  NEUTROABS  --   --  4.6  HGB 10.8* 10.6* 11.1*  HCT 32.6* 33.5* 33.3*  MCV 86.0 88.2 85.8  PLT 369 381 418*    CBG: No results for input(s): "GLUCAP" in the last 168 hours.  Brief HPI:     Megan Walter. Newman Pies is a 77 year old female with history of osteoporosis, obesity-BMI 40.9 who was admitted on 06/15/2022 after slipping and falling in her garage with acute onset of left wrist and left knee pain.  She was found  to have left tibial plateau fracture, left distal radius fracture and nondisplaced fracture of ulnar styloid process.  She was evaluated by Dr. Doreatha Martin and underwent ORIF left tibial plateau and ORIF left distal radius on 02/23.  Recommendations to be NWB LLE and left wrist.  She was started on Lovenox and transition to low-dose Eliquis for DVT prophylaxis.  PT/ OT has been working with patient  who continues to be limited by weakness, pain and WB restrictions. CIR  recommended  due to functional decline.    Hospital Course: ZONDRA LAWLOR was admitted to rehab 06/26/2022 for inpatient therapies to consist of PT and OT at least three hours five days a week. Past admission physiatrist, therapy team and rehab RN have worked together to provide customized collaborative inpatient rehab. Her blood pressures were monitored on TID basis and have been stable. Pain has been reasonably controlled on hydrocodone and she was educated on need to taper this off post discharge. Her Left knee incision is C/D/I and healing well. Left wrist splint was taken down by ortho on 03/08 and changed out to velco splint prior to  discharge. Wrist films done showing improvement in alignment.  She was cleared to start gentle wrist ROM out of brace as tolerated by ortho.   Follow up labs showed ABLA is resolving and check of lytes showed recurrent hypokalemia which has resolved with brief supplementation.  Her p.o.  intake has been good and recommend follow-up BMET by PCP in 1 to 2 weeks to monitor for stability.  Follow-up CBC showed acute blood loss anemia is resolving.  Ergocalciferol was added for low normal vitamin D levels @ 36.98.   Protein supplements also added to help with hypoalbuminemia.  She has made gains and requires min assist.  She requested short LOS therefore gains were limited.   She will continue to receive follow-up home health PT and OT by [facility] home health after discharge.    Rehab course: During patient's  stay in rehab weekly team conference was held to monitor patient's progress, set goals and discuss barriers to discharge. At admission, patient required min assist with basic ADL tasks and with mobility.  She  has had improvement in activity tolerance, balance, postural control as well as ability to compensate for deficits.  She requires min assist with ADL tasks.  She requires supervision with verbal cues for transfers and is able to ambulate 10 feet with mod assist and use of left platform walker.  Gait has been limited by weakness and deconditioning as well as pain right knee. Family education has been completed.   Disposition: Home  Diet: Regular  Special Instructions: No weight on left leg and left wrist. 2.  Recommend repeat BMET in 1-2 weeks to monitor potassium level.   Allergies as of 06/29/2022   No Known Allergies      Medication List     STOP taking these medications    cholecalciferol 1000 units tablet Commonly known as: VITAMIN D   docusate sodium 100 MG capsule Commonly known as: COLACE   senna-docusate 8.6-50 MG tablet Commonly known as: Senokot-S       TAKE these medications    apixaban 2.5 MG Tabs tablet Commonly known as: ELIQUIS Take 1 tablet (2.5 mg total) by mouth 2 (two) times daily.   calamine lotion Apply 1 Application topically 2 (two) times daily.   calcium citrate 950 (200 Ca) MG tablet Commonly known as: CALCITRATE - dosed in mg elemental calcium Take 1 tablet by mouth daily.   cetirizine 10 MG tablet Commonly known as: ZYRTEC Take 1 tablet (10 mg total) by mouth daily.   fluticasone 50 MCG/ACT nasal spray Commonly known as: FLONASE Place 2 sprays into both nostrils daily.   HYDROcodone-acetaminophen 5-325 MG tablet--Rx# 40 pills.  Commonly known as: NORCO/VICODIN Take 1-2 tablets by mouth every 8 (eight) hours as needed for moderate pain. What changed:  when to take this reasons to take this   melatonin 3 MG Tabs tablet Take 1  tablet (3 mg total) by mouth at bedtime.   methocarbamol 500 MG tablet Commonly known as: ROBAXIN Take 1 tablet (500 mg total) by mouth every 6 (six) hours as needed for muscle spasms.   polyethylene glycol 17 g packet Commonly known as: MIRALAX / GLYCOLAX Take 17 g by mouth daily as needed for mild constipation.   Vitamin D (Ergocalciferol) 1.25 MG (50000 UNIT) Caps capsule Commonly known as: DRISDOL Take 1 capsule (50,000 Units total) by mouth every Monday. Start taking on: July 03, 2022        Follow-up Information     Owens Loffler, DO Follow up on 07/01/2022.   Specialty: Family Medicine Why: Call in 1-2 days for post hospital follow up Contact information: [address], [place] [address] [place] [address] [phone]         Izora Ribas, MD. Call.   Specialty:  Physical Medicine and Rehabilitation Why: As needed Contact information: [address] [address] [phone]         Shona Needles, MD. Schedule an appointment as soon as possible for a visit in 2 week(s).   Specialty: Orthopedic Surgery Why: for repeat x-rays left wrist and left knee Contact information: [place] [address] [phone]                 Signed: Bary Leriche 07/03/2022, 5:36 PM

## 2022-06-29 NOTE — IPOC Note (Signed)
Overall Plan of Care [facility]) Patient Details Name: Megan Walter MRN: [id number] DOB: 11-06-1945  Admitting Diagnosis: Trauma  Hospital Problems: Principal Problem:   Trauma     Functional Problem List: Nursing Bladder, Safety, Bowel, Edema, Endurance, Pain  PT Balance, Safety, Endurance, Motor, Pain  OT Balance, Endurance, Motor, Pain, Edema  SLP    TR         Basic ADL's: OT Grooming, Bathing, Dressing, Toileting     Advanced  ADL's: OT       Transfers: PT Bed Mobility, Bed to Chair, Car, Manufacturing systems engineer, Metallurgist: PT Emergency planning/management officer, Ambulation     Additional Impairments: OT None  SLP        TR      Anticipated Outcomes Item Anticipated Outcome  Self Feeding no goal  Swallowing      Basic self-care  mod I  Toileting  mod I   Bathroom Transfers (S)- min A  Bowel/Bladder  manage bowel w mod I and bladder w toileting  Transfers  supervision OOB w/ LRAD, mod I bed mobility.  Locomotion  w/c mobility supervision, gait w/ min A  Communication     Cognition     Pain  < 4 with prns  Safety/Judgment  manage w cues   Therapy Plan: PT Intensity: Minimum of 1-2 x/day ,45 to 90 minutes PT Frequency: 5 out of 7 days PT Duration Estimated Length of Stay: 7-10 days OT Intensity: Minimum of 1-2 x/day, 45 to 90 minutes OT Frequency: 5 out of 7 days OT Duration/Estimated Length of Stay: 7-10 days     Team Interventions: Nursing Interventions Patient/Family Education, Pain Management, Bladder Management, Medication Management, Discharge Planning, Bowel Management, Disease Management/Prevention  PT interventions Ambulation/gait training, Community reintegration, Neuromuscular re-education, UE/LE Strength taining/ROM, Wheelchair propulsion/positioning, Training and development officer, Discharge planning, Pain management, Therapeutic Activities, UE/LE Coordination activities, Functional mobility training, Patient/family education,  Therapeutic Exercise  OT Interventions Balance/vestibular training, Discharge planning, Pain management, Self Care/advanced ADL retraining, Therapeutic Activities, UE/LE Coordination activities, Functional mobility training, Patient/family education, Therapeutic Exercise, Wheelchair propulsion/positioning, UE/LE Strength taining/ROM, Psychosocial support, DME/adaptive equipment instruction  SLP Interventions    TR Interventions    SW/CM Interventions Discharge Planning, Psychosocial Support, Patient/Family Education, Disease Management/Prevention   Barriers to Discharge MD  Medical stability  Nursing Decreased caregiver support, Home environment access/layout 1 level, 2 ste bil rails w spouse.Megan Walter (husband) works but daughters live nearby and can come assist days  PT Home environment Child psychotherapist, Weight bearing restrictions NWB to LUE and LLE  OT      SLP      SW Decreased caregiver support, Insurance underwriter for SNF coverage     Team Discharge Planning: Destination: PT-Home ,OT- Home , SLP-  Projected Follow-up: PT-Home health PT (may need delay until WB increases.), OT-  Home health OT, SLP-  Projected Equipment Needs: PT-Wheelchair cushion (measurements), Wheelchair (measurements), OT- 3 in 1 bedside comode, SLP-  Equipment Details: PT-24", OT-  Patient/family involved in discharge planning: PT- Patient, Family member/caregiver,  OT-Patient, Family member/caregiver, SLP-   MD ELOS: 5 days Medical Rehab Prognosis:  Excellent Assessment: The patient has been admitted for CIR therapies with the diagnosis of  polytrauma.The team will be addressing functional mobility, strength, stamina, balance, safety, adaptive techniques and equipment, self-care, bowel and bladder mgt, patient and caregiver education. Goals have been set at supervision. Anticipated discharge destination is home.         See Team Conference Notes for  weekly updates to the plan of care

## 2022-06-29 NOTE — Progress Notes (Signed)
Patient ID: Megan Walter, female   DOB: July 01, 1945, 77 y.o.   MRN: [id number]  Met with pt to discuss rolling walker and platform she is aware it will not be covered and she has decided to get them on her own and get [employer] to get her the wheelchair and bedside commode. Made aware to [employer] of this and her discharge date of 3/8-tomorrow. Pt has information to order on-line for the rolling walker and platform.

## 2022-06-29 NOTE — Progress Notes (Signed)
Occupational Therapy Discharge Summary  Patient Details  Name: Megan Walter MRN: [id number] Date of Birth: 1945-06-10  Date of Discharge from [place] service:June 29, 2022   Patient has met 1 of 6 long term goals due to improved activity tolerance, improved balance, and improved coordination.  Patient to discharge at [facility] A level for self-care. Patient's family is independent to provide the necessary physical assistance at discharge however due to pt pushing for expedited DC, family education was unable to be arranged and pt subsequently did not meet most of therapy goals. Pt does have supportive family who has verbalized their readiness to assist pt at her CLOF.  Reasons goals not met: All goals except bathing goal not met due to rushed DC at pt's discretion, therefore limited time to conduct skilled intervention vs coordinating DC plans and DME needs to ensure safe DC   Recommendation:  Patient will benefit from ongoing skilled OT services in home health setting to continue to advance functional skills in the area of BADL, iADL, and Reduce care partner burden.  Equipment: BSC  Reasons for discharge: treatment goals met and discharge from hospital  Patient/family agrees with progress made and goals achieved: Yes  OT Discharge Precautions/Restrictions  Precautions Precautions: Fall Restrictions Weight Bearing Restrictions: Yes LUE Weight Bearing: Weight bear through elbow only LLE Weight Bearing: Non weight bearing ADL ADL Eating: Independent Where Assessed-Eating: Edge of bed Grooming: Independent Where Assessed-Grooming: Wheelchair Upper Body Bathing: Setup Where Assessed-Upper Body Bathing: Sitting at sink Lower Body Bathing: Setup Where Assessed-Lower Body Bathing: Sitting at sink, Standing at sink Upper Body Dressing: Setup Where Assessed-Upper Body Dressing: Sitting at sink Lower Body Dressing: Minimal assistance Where Assessed-Lower Body Dressing: Sitting at  sink, Standing at sink Toileting: Minimal assistance Where Assessed-Toileting: Bedside Commode Toilet Transfer: Close supervision Toilet Transfer Method: Stand pivot Science writer: Engineer, technical sales Transfer: Moderate assistance Tub/Shower Transfer Method: Stand pivot Tub/Shower Equipment: Facilities manager: Unable to assess Social research officer, government Method: Unable to assess Vision Baseline Vision/History: 0 No visual deficits Patient Visual Report: No change from baseline Vision Assessment?: No apparent visual deficits Perception  Perception: Within Functional Limits Praxis Praxis: Intact Cognition Cognition Overall Cognitive Status: Within Functional Limits for tasks assessed Arousal/Alertness: Awake/alert Orientation Level: Person;Situation;Place Person: Oriented Place: Oriented Situation: Oriented Memory: Appears intact Awareness: Appears intact Problem Solving: Appears intact Safety/Judgment: Appears intact Brief Interview for Mental Status (BIMS) Repetition of Three Words (First Attempt): 3 Temporal Orientation: Year: Missed by more than 5 years Temporal Orientation: Month: Accurate within 5 days Temporal Orientation: Day: Correct Recall: "Sock": Yes, no cue required Recall: "Blue": Yes, no cue required Recall: "Bed": Yes, no cue required BIMS Summary Score: 12 Sensation Sensation Light Touch: Appears Intact Hot/Cold: Not tested Proprioception: Appears Intact Stereognosis: Not tested Coordination Gross Motor Movements are Fluid and Coordinated: No Fine Motor Movements are Fluid and Coordinated: No Coordination and Movement Description: altered balance strategies 2/2 LLE NWB precautions, generalized weakness/deconditioning, and pain Finger Nose Finger Test: [facility] bilaterally Heel Shin Test: WFL on RLE, unable to lift LLE Motor  Motor Motor: Other (comment) Motor - Skilled Clinical Observations: altered balance  strategies 2/2 LLE NWB precautions, generalized weakness/deconditioning, and pain Mobility  Bed Mobility Bed Mobility: Rolling Right;Rolling Left;Sit to Supine;Supine to Sit Rolling Right: Independent with assistive device Rolling Left: Independent with assistive device Supine to Sit: Independent with assistive device Sit to Supine: Independent with assistive device Transfers Sit to Stand: Supervision/Verbal cueing Stand to Sit:  Supervision/Verbal cueing  Trunk/Postural Assessment  Cervical Assessment Cervical Assessment: Within Functional Limits Thoracic Assessment Thoracic Assessment: Within Functional Limits Lumbar Assessment Lumbar Assessment: Within Functional Limits Postural Control Postural Control: Deficits on evaluation Protective Responses: delayed due to LLE NWB precautions  Balance Balance Balance Assessed: Yes Static Sitting Balance Static Sitting - Balance Support: Feet supported;No upper extremity supported Static Sitting - Level of Assistance: 7: Independent Dynamic Sitting Balance Dynamic Sitting - Balance Support: Feet supported;No upper extremity supported Dynamic Sitting - Level of Assistance: 7: Independent Static Standing Balance Static Standing - Balance Support: During functional activity;Bilateral upper extremity supported (L PFRW) Static Standing - Level of Assistance: 5: Stand by assistance (supervision) Dynamic Standing Balance Dynamic Standing - Balance Support: During functional activity;Bilateral upper extremity supported (L PFRW) Dynamic Standing - Level of Assistance: 5: Stand by assistance (supervision) Dynamic Standing - Comments: with transfers Extremity/Trunk Assessment RUE Assessment RUE Assessment: Within Functional Limits LUE Assessment LUE Assessment: Exceptions to [facility] General Strength Comments: WB restrictions at wrist- [facility] for the shoulder   Blase Mess, MS, OTR/L  06/29/2022, 2:34 PM

## 2022-06-29 NOTE — Progress Notes (Signed)
Occupational Therapy Session Note  Patient Details  Name: MARCHEL DRUM MRN: [id number] Date of Birth: 05-17-1945  Today's Date: 06/29/2022 OT Individual Time: 1020-1102 OT Individual Time Calculation (min): 42 min    Short Term Goals: Week 1:  OT Short Term Goal 1 (Week 1): STG= LTG d/t ELOS  Skilled Therapeutic Interventions/Progress Updates:  Skilled OT intervention completed with focus on DC planning, and DME education. Pt received upright in bed, agreeable to session. No pain reported.  Pt declined self-care needs. Bed mobility with mod I to EOB. Supervision sit > stand using L platform RW with bed elevated per request, then supervision stand pivot to w/c. Transported dependently in w/c <> gym for time.  Pt reported that she was unsure of what was ordered by CSW for DME in prep for DC. Upon inspection and coordinating with CSW- pt not able to receive both WC and RW with L platform, along with being unable to receive the wide BSC per pt preference. OT provided handouts on where all of these could be purchased OOP.  Education and demo provided on tendon glides for preventing adhesions in L wrist while immobilized, as well as proximal exercises and AROM that can be done including scapular retraction/shoulder rolls, shoulder flexion (with small wrist weight at elbow). HEP issued and pt able to do all x10.  Min A sit > stand from w/c using L PFRW, then supervision stand pivot to EOB, mod I bed mobility to upright in bed. Pt remained upright in bed, with bed alarm on/activated, and with all needs in reach at end of session.   Therapy Documentation Precautions:  Precautions Precautions: Fall Restrictions Weight Bearing Restrictions: Yes LUE Weight Bearing: Weight bear through elbow only LLE Weight Bearing: Non weight bearing    Therapy/Group: Individual Therapy  Blase Mess, MS, OTR/L  06/29/2022, 2:03 PM

## 2022-06-29 NOTE — Progress Notes (Signed)
PROGRESS NOTE   Subjective/Complaints: Has no new complaints this morning Asks about ortho follow-up- will consult today to see if they can see her before she leaves tomorrow  ROS: pain is well controlled, +insomnia- did not yet receive trapeze bar   Objective:   No results found. Recent Labs    06/27/22 0740  WBC 6.4  HGB 11.1*  HCT 33.3*  PLT 418*   Recent Labs    06/27/22 0740 06/28/22 1217  NA 137 139  K 3.3* 3.7  CL 103 103  CO2 29 24  GLUCOSE 121* 100*  BUN 11 14  CREATININE 0.83 0.63  CALCIUM 8.8* 9.1    Intake/Output Summary (Last 24 hours) at 06/29/2022 1203 Last data filed at 06/29/2022 0902 Gross per 24 hour  Intake 420 ml  Output 750 ml  Net -330 ml        Physical Exam: Vital Signs Blood pressure (!) 116/57, pulse 86, temperature 98.3 F (36.8 C), resp. rate 18, height '5\' 5"'$  (1.651 m), weight 116.8 kg, SpO2 99 %. Gen: no distress, normal appearing, BMI 42.85 HEENT: oral mucosa pink and moist, NCAT Cardio: Reg rate Chest: normal effort, normal rate of breathing Abd: soft, non-distended Ext: + lower extremity edema Psych: pleasant, normal affect Skin: Splint left wrist--NV intact. Left knee and shin with incisions - C/D/I , please see image Neuro: Alert and oriented x 4, follows commands, cranial nerves II through XII intact, normal speech and language Strength 5 out of 5 in right upper extremity and right lower extremity Strength 5 out of 5 left shoulder abduction, at least 4 out of 5 elbow flexion extension, 3 out of 5 finger flexion and extension Strength 2-3 out of 5 proximal left lower extremity, strength at least 3 out of 5 distal left lower extremity MMT-limited by weightbearing restrictions Sensation to light touch intact in all 4 extremities however sensation decreased along right anterior shin-patient reports this is from a prior surgery Musculoskeletal: Appropriately tender left  lower extremity Left lower extremity pain with movement at the hip and knee    Assessment/Plan: 1. Functional deficits which require 3+ hours per day of interdisciplinary therapy in a comprehensive inpatient rehab setting. Physiatrist is providing close team supervision and 24 hour management of active medical problems listed below. Physiatrist and rehab team continue to assess barriers to discharge/monitor patient progress toward functional and medical goals  Care Tool:  Bathing    Body parts bathed by patient: Right arm, Left arm, Left upper leg, Right lower leg, Chest, Abdomen, Left lower leg, Front perineal area, Face, Right upper leg   Body parts bathed by helper: Buttocks     Bathing assist Assist Level: Minimal Assistance - Patient > 75%     Upper Body Dressing/Undressing Upper body dressing   What is the patient wearing?: Pull over shirt, Bra    Upper body assist Assist Level: Moderate Assistance - Patient 50 - 74%    Lower Body Dressing/Undressing Lower body dressing      What is the patient wearing?: Pants     Lower body assist Assist for lower body dressing: Minimal Assistance - Patient > 75%  Toileting Toileting    Toileting assist Assist for toileting: Minimal Assistance - Patient > 75%     Transfers Chair/bed transfer  Transfers assist     Chair/bed transfer assist level: Supervision/Verbal cueing     Locomotion Ambulation   Ambulation assist   Ambulation activity did not occur: Safety/medical concerns  Assist level: Moderate Assistance - Patient 50 - 74% Assistive device: Walker-platform Max distance: 10   Walk 10 feet activity   Assist  Walk 10 feet activity did not occur: Safety/medical concerns  Assist level: Moderate Assistance - Patient - 50 - 74% Assistive device: Walker-platform   Walk 50 feet activity   Assist Walk 50 feet with 2 turns activity did not occur: Safety/medical concerns (pain, decreased balance)          Walk 150 feet activity   Assist Walk 150 feet activity did not occur: Safety/medical concerns (pain, decreased balance)         Walk 10 feet on uneven surface  activity   Assist Walk 10 feet on uneven surfaces activity did not occur: Safety/medical concerns (pain, decreased balance)         Wheelchair     Assist Is the patient using a wheelchair?: Yes Type of Wheelchair: Manual    Wheelchair assist level: Supervision/Verbal cueing Max wheelchair distance: 141f    Wheelchair 50 feet with 2 turns activity    Assist        Assist Level: Supervision/Verbal cueing   Wheelchair 150 feet activity     Assist      Assist Level: Supervision/Verbal cueing   Blood pressure (!) 116/57, pulse 86, temperature 98.3 F (36.8 C), resp. rate 18, height '5\' 5"'$  (1.651 m), weight 116.8 kg, SpO2 99 %.  Medical Problem List and Plan: 1. Functional deficits secondary to trauma after a fall with left distal radius fracture, ulnar styloid process fracture,  left proximal fibular shaft fracture and left tibial plateau fracture             -patient may not shower             -ELOS/Goals: 10 to 14 days PT/OT min a to supervision             -Admit to CIR, PT OT  2.  Antithrombotics: -DVT/anticoagulation:  Pharmaceutical: Eliquis 0.5 mg twice daily for 30 days             -antiplatelet therapy: N/A 3. Pain Management:  Robaxin /Hydrocodone prn.  4. Insomnia: due to difficulty positioning herself at night, trapeze bar ordered- asked nursing to follow-up as this was not received 5. Neuropsych/cognition: This patient is capable of making decisions on her own behalf. 6. Skin/Wound Care: Routine pressure relief measures. Check lytes in am.  7. Fluids/Electrolytes/Nutrition: Monitor I/O. Check CMET in am.  8. Left tibial plateau Fx s/p ORIF: NWB LLE. Consulted ortho to see if XRs can be obtained prior to d/c 9. Distal radius/ulna styloid Fx: NWB wrist and WBAT thru left wrist.  Asked ortho to consult and repeat Xrs prior to d/c 10. Vitamin D deficiency: Vit D-17.18 and started on ergocalciferol on 02/26 11. ABLA: HGB 10.6 06/26/22. Recheck in am--improving.  12. Hypoalbuminemia: Alb-2.7. Will add protein supplements. Encouraged choosing high protein foods 13. Morbid Obesity. BMI 40.98-->42.85. Dietary counseling.  14.  Rhabdomyolysis improved with IV hydration 15.  Elevated bilirubin.  Appears to be improving.  Reviewed 3/5 and is stable 16. Hypokalemia: 469m klor ordered 3/5, repeat BMP ordered 3/6. Improved,  17. Hyperglycemia: HgbA1c reviewed from 3/5 and is 5.5. Improving 18. Lower extremity edema: monitor daily.    LOS: 3 days A FACE TO FACE EVALUATION WAS PERFORMED  Megan Walter 06/29/2022, 12:03 PM

## 2022-06-29 NOTE — Progress Notes (Signed)
Physical Therapy Discharge Summary  Patient Details  Name: Megan Walter MRN: [id number] Date of Birth: 1945/09/18  Date of Discharge from PT service:June 29, 2022  Today's Date: 06/29/2022 PT Individual Time: 0730-0839 PT Individual Time Calculation (min): 69 min   Patient has met 8 of 10 long term goals due to improved activity tolerance, improved balance, improved postural control, increased strength, increased range of motion, ability to compensate for deficits, improved awareness, and improved coordination. Patient to discharge at a wheelchair level Supervision. Patient's care partner is independent to provide the necessary physical assistance at discharge. Pt to discharge home with assist provided by 2 adult daughters.  Reasons goals not met: Pt did not meet gait goals of min A as pt currently requires mod A to ambulate 60f with L PFRW due to weakness/deconditioning, decreased balance, and pain in R knee. Pt also requesting to D/C ASAP, therefore limiting amount of skilled physical therapy pt could have received.   Recommendation:  Patient will benefit from ongoing skilled PT services in home health setting to continue to advance safe functional mobility, address ongoing impairments in transfers, generalized strengthening and endurance, dynamic standing balance/coordination, gait training, and to minimize fall risk.  Equipment: 20x16 manual WC with L elevating legrest, L platform RW  Reasons for discharge: treatment goals met and discharge from hospital  Patient/family agrees with progress made and goals achieved: Yes  Today's Interventions: Received pt semi-reclined in bed, pt agreeable to PT treatment, and denied any pain during session. Session with emphasis on discharge planning, functional mobility/transfers, generalized strengthening and endurance, dynamic standing balance/coordination, and simulated car transfers. Went through sensation, MMT, and pain interference  questionnaire. Pt demonstrates good adherence to LLE NWB precautions throughout session. Pt performed bed mobility from flat bed without bedrails and mod I (need for increased time) to simulate bed at home. Donned R shoe with max A and pt stood with L PFRW and close supervision from elevated EOB and required min A to pull pants over hips on L side. Pt transferred bed<>WC stand<>pivot with L PFRW and close supervision.   Used gait belt to secure LLE to elevating legrest and pt performed WC mobility [id number] RUE/RLE and supervision with increased time/effort. Transported remainder of way [facility] and located walker balls to put on RW ends to slide easier. Pt performed simulated car transfer with L PFRW and close supervision, required assist from UEs to get LLE in/out of car - noted increased pain and difficulty bending L knee. Transported to dayroom and RN arrived to administer medications. Pt performed the following exercises with emphasis on LE strength/ROM: -L knee flexion AAROM towel slides 2x10 -toe raises 2x20 bilaterally -heel raises 2x20 bilaterally - caution to avoid placing weight through LLE Pt then performed RLE strengthening on Kinetron at 20 cm/sec for 1 minute x 4 trials with therapist providing manual counter resistance with emphasis on glute/quad strength. Transported back to room and concluded session with pt sitting in [facility] [place] [facility] all needs within reach.   PT Discharge Precautions/Restrictions Precautions Precautions: Fall Restrictions Weight Bearing Restrictions: Yes LUE Weight Bearing: Weight bear through elbow only LLE Weight Bearing: Non weight bearing Pain Interference Pain Interference Pain Effect on Sleep: 2. Occasionally Pain Interference with Therapy Activities: 1. Rarely or not at all Pain Interference with Day-to-Day Activities: 1. Rarely or not at all Cognition Overall Cognitive Status: Within Functional Limits for tasks  assessed Arousal/Alertness: Awake/alert Orientation Level: Oriented X4 Memory: Appears intact Awareness: Appears  intact Problem Solving: Appears intact Safety/Judgment: Appears intact Sensation Sensation Light Touch: Appears Intact Hot/Cold: Not tested Proprioception: Appears Intact Stereognosis: Not tested Coordination Gross Motor Movements are Fluid and Coordinated: No Fine Motor Movements are Fluid and Coordinated: No Coordination and Movement Description: altered balance strategies 2/2 LLE NWB precautions, generalized weakness/deconditioning, and pain Finger Nose Finger Test: [facility] bilaterally Heel Shin Test: WFL on RLE, unable to lift LLE Motor  Motor Motor: Other (comment) Motor - Skilled Clinical Observations: altered balance strategies 2/2 LLE NWB precautions, generalized weakness/deconditioning, and pain  Mobility Bed Mobility Bed Mobility: Rolling Right;Rolling Left;Sit to Supine;Supine to Sit Rolling Right: Independent with assistive device Rolling Left: Independent with assistive device Supine to Sit: Independent with assistive device Sit to Supine: Independent with assistive device Transfers Transfers: Sit to Stand;Stand to Sit;Stand Pivot Transfers Sit to Stand: Supervision/Verbal cueing Stand to Sit: Supervision/Verbal cueing Stand Pivot Transfers: Supervision/Verbal cueing Transfer (Assistive device): Left platform walker Locomotion  Gait Ambulation: Yes Gait Assistance: Moderate Assistance - Patient 50-74% Gait Distance (Feet): 10 Feet Assistive device: Left platform walker Gait Assistance Details: Verbal cues for safe use of DME/AE Gait Assistance Details: verbal cues to keep RW directly underneath shoulders Gait Gait: Yes Gait Pattern: Impaired (hop to) Gait velocity: decreased Stairs / Additional Locomotion Stairs: No Pick up small object from the floor assist level: Contact Guard/Touching assist Pick up small object from the floor assistive  device: tissue box using Runner, broadcasting/film/video Wheelchair Mobility: Yes Wheelchair Assistance: Supervision/Verbal cueing Wheelchair Propulsion: Right lower extremity;Right upper extremity Wheelchair Parts Management: Needs assistance Distance: 144f  Trunk/Postural Assessment  Cervical Assessment Cervical Assessment: Within Functional Limits Thoracic Assessment Thoracic Assessment: Within Functional Limits Lumbar Assessment Lumbar Assessment: Within Functional Limits Postural Control Postural Control: Deficits on evaluation Protective Responses: delayed due to LLE NWB precautions  Balance Balance Balance Assessed: Yes Static Sitting Balance Static Sitting - Balance Support: Feet supported;No upper extremity supported Static Sitting - Level of Assistance: 7: Independent Dynamic Sitting Balance Dynamic Sitting - Balance Support: Feet supported;No upper extremity supported Dynamic Sitting - Level of Assistance: 7: Independent Static Standing Balance Static Standing - Balance Support: During functional activity;Bilateral upper extremity supported (L PFRW) Static Standing - Level of Assistance: 5: Stand by assistance (supervision) Dynamic Standing Balance Dynamic Standing - Balance Support: During functional activity;Bilateral upper extremity supported (L PFRW) Dynamic Standing - Level of Assistance: 5: Stand by assistance (supervision) Dynamic Standing - Comments: with transfers Extremity Assessment  RLE Assessment RLE Assessment: Within Functional Limits General Strength Comments: tested sitting in WC LLE Assessment LLE Assessment: Exceptions to [facility] St[facility] Comments: tested sitting in WC - limited by minor pain LLE Strength Left Hip Flexion: 3-/5 Left Hip ABduction: 3+/5 Left Hip ADduction: 3+/5 Left Knee Flexion: 3+/5 Left Knee Extension: 3/5 Left Ankle Dorsiflexion: 3+/5  AAlfonse AlpersPT, DPT  06/29/2022, 6:56 AM

## 2022-06-29 NOTE — Progress Notes (Signed)
Physical Therapy Session Note  Patient Details  Name: Megan Walter MRN: [id number] Date of Birth: 1946/04/15  Today's Date: 06/29/2022 PT Individual Time: 1400-1510 PT Individual Time Calculation (min): 70 min   Short Term Goals: Week 1:  PT Short Term Goal 1 (Week 1): STG = LTGs 2/2 ELOS.  Skilled Therapeutic Interventions/Progress Updates:    Finalized d/c planning and discussion about equipment recommendations and follow-ups. Pt states she feels prepared for d/c tomorrow. Focused on functional transfers, education, and HEP. Pt performed bed mobility with extra time but overall independently including management of LLE on flat mat in gym and on bed in room. Stand pivot transfers with L PFRW with overall supervision and cues for efficiency and technique with emphasis on increased R foot clearance vs. Scooting foot along floor from safety standpoint but UE stamina and strength limiting. Pt instructed in various options for HEP for BLE (but focus on LLE AROM and strengthening) in supine, seated and standing positions - educated not needed to do ALL of these exercises but same muscle group offered in different positions. Pt verbalized understanding. Issued handout as well and reviewed with patient. (See below). End of session pt transferred back to bed from w/c with supervision with PFRW and all needs in reach.   Access Code: [email or web address] URL: [email or web address] Date: 06/29/2022 Prepared by: Bryson Ha  Exercises - Supine Quad Set  - 1 x daily - 7 x weekly - 3 sets - 10 reps - 5-10 sec hold - Supine Heel Slide  - 1 x daily - 7 x weekly - 3 sets - 10 reps - Supine Hip Abduction  - 1 x daily - 7 x weekly - 3 sets - 10 reps - Single Leg Bridge  - 1 x daily - 7 x weekly - 3 sets - 10 reps - 5-10 sec hold - Seated Long Arc Quad  - 1 x daily - 7 x weekly - 3 sets - 10 reps - Seated Knee Flexion  - 1 x daily - 7 x weekly - 3 sets - 10 reps - Seated Isometric Hip Abduction with Belt  - 1 x  daily - 7 x weekly - 3 sets - 10 reps - Seated Hip Adduction Isometrics with Ball  - 1 x daily - 7 x weekly - 3 sets - 10 reps - Standing Hip Flexion  - 1 x daily - 7 x weekly - 3 sets - 10 reps - Standing Knee Flexion AROM with Chair Support  - 1 x daily - 7 x weekly - 3 sets - 10 reps - Standing 3-way Hip with Walker  - 1 x daily - 7 x weekly - 3 sets - 10 reps  Therapy Documentation Precautions:  Precautions Precautions: Fall Restrictions Weight Bearing Restrictions: Yes LUE Weight Bearing: Weight bear through elbow only LLE Weight Bearing: Non weight bearing  Pain: As session progressed, pt with noted discomfort and fatigue. Pt reports planning to ask about pain medication after session, had some earlier. Activity was within her tolerance.      Therapy/Group: Individual Therapy  Canary Brim Ivory Broad, PT, DPT, CBIS  06/29/2022, 3:17 PM

## 2022-06-29 NOTE — TOC Transition Note (Signed)
TOC DISCHARGE MEDS IN TOC PHARMACY LOCATED ON 2ND FLOOR AWAITING PT DISCHARGE

## 2022-06-29 NOTE — Progress Notes (Signed)
Inpatient Rehabilitation Discharge Medication Review by a Pharmacist  A complete drug regimen review was completed for this patient to identify any potential clinically significant medication issues.  High Risk Drug Classes Is patient taking? Indication by Medication  Antipsychotic No   Anticoagulant Yes Apixaban- s/p ortho sx  Antibiotic No   Opioid Yes Norco- acute pain  Antiplatelet No   Hypoglycemics/insulin No   Vasoactive Medication No   Chemotherapy No   Other Yes Zyrtec- seasonal allergies Flonase- seasonal rhinitis Robaxin- muscle spasms Melatonin- sleep     Type of Medication Issue Identified Description of Issue Recommendation(s)  Drug Interaction(s) (clinically significant)     Duplicate Therapy     Allergy     No Medication Administration End Date     Incorrect Dose     Additional Drug Therapy Needed     Significant med changes from prior encounter (inform family/care partners about these prior to discharge).    Other       Clinically significant medication issues were identified that warrant physician communication and completion of prescribed/recommended actions by midnight of the next day:  No   Time spent performing this drug regimen review (minutes):  30   Lonnetta Kniskern BS, PharmD, BCPS Clinical Pharmacist 06/29/2022 11:52 AM  Contact: [phone] after 3 PM  "Be curious, not judgmental..." -Jamal Maes

## 2022-06-30 NOTE — Progress Notes (Signed)
Orthopaedic Trauma Progress Note  SUBJECTIVE: Doing well today.  Pain controlled in both the wrist and the left knee.  Occasionally gets some muscle spasms in the knee after being up and moving around but otherwise no significant issues.  States therapies have been going well and mobilization with the platform walker is getting easier.  She feels ready for discharge home today.  OBJECTIVE:  General: Sitting up in bed, no acute distress Respiratory: No increased work of breathing.  Left upper extremity: Splint taken down.  Incision stable.  Minimal swelling to the wrist, hand, fingers.  Tolerates gentle wrist range of motion without significant increase in pain.  Able to wiggle the fingers.  Sensation intact throughout all aspects of the hand.  Motor function intact in the median ulnar and radial nerve distributions.  Hand warm and well-perfused.  2+ radial pulse.   Left lower extremity: Incisions clean, dry, intact with well-approximated skin edges.  Swelling improving. No significant tenderness with palpation about the knee.  Continues to have some widespread soreness in the calf.  Ankle DF/PF intact.  Toes warm well-perfused.  Endorses sensation distally.  Neurovascularly intact.  IMAGING: Repeat imaging left wrist and left knee performed 06/29/22 stable   ASSESSMENT: Megan Walter is a 77 y.o. female s/p fall  Procedures: OPEN REDUCTION INTERNAL FIXATION LEFT DISTAL RADIUS FRACTURE 06/16/22  OPEN REDUCTION INTERNAL FIXATION LEFT TIBIAL PLATEAU FRACTURE 06/16/22  CV/Blood loss: Hgb stable. Hemodynamically stable  PLAN: Weightbearing: NWB LLE.  Okay to WB through left elbow.  NWB through wrist  ROM:  LLE - okay for unrestricted range of motion of the hip, knee, ankle LUE - unrestricted shoulder/elbow ROM. OK to start gentle wrist ROM out of the brace as tolerated Incisional and dressing care:  LLE - Ok to leave incisions open to air LUE - Ok to leave incisions open to air Showering:  Incisions may get wet bot LLE/LUE Orthopedic device(s): Transition to removable splint LUE today Pain management: Continue current regimen VTE prophylaxis: Eliquis, SCDs Impediments to Fracture Healing: Vitamin D level 17, started on supplementation Dispo: D/c home today. Continue Eliquis for additonal 14 days. Continue 50,000 units Vit D supplementation q. 7 days x 8 weeks  Follow - up plan: 2 weeks after discharge for wound check and repeat x-rays   Contact information:  Katha Hamming MD, Rushie Nyhan PA-C. After hours and holidays please check [email or web address] for group call information for [facility]   Gwinda Passe, PA-C [phone] (office) [email or web address]

## 2022-06-30 NOTE — Progress Notes (Signed)
PROGRESS NOTE   Subjective/Complaints: No new complaints this morning Has not yet reviewed medications this morning Discussed her XR results.   ROS: pain continues to be well controlled, +insomnia- did not yet receive trapeze bar   Objective:   DG Knee 1-2 Views Left  Result Date: 06/29/2022 CLINICAL DATA:  Recent fall, fracture, ORIF EXAM: LEFT KNEE - 1-2 VIEW COMPARISON:  06/17/2022 FINDINGS: Frontal and lateral views of the left knee are obtained. Lateral plate and screw fixation traverses the comminuted proximal tibial plateau fracture seen previously, with stable near anatomic alignment. Comminuted minimally displaced proximal fibular diaphyseal fracture is unchanged. Diffuse subcutaneous edema. Small residual left knee effusion. IMPRESSION: 1. Stable ORIF of a proximal left tibial plateau fracture. Near anatomic alignment. 2. Stable proximal fibular diaphyseal fracture. Near anatomic alignment. 3. Diffuse soft tissue edema. 4. Small residual left knee effusion, decreased since prior study. Electronically Signed   By: Randa Ngo M.D.   On: 06/29/2022 16:26   DG Wrist 2 Views Left  Result Date: 06/29/2022 CLINICAL DATA:  Tripped and fell, recent left wrist surgery EXAM: LEFT WRIST - 2 VIEW COMPARISON:  06/17/2022 FINDINGS: Frontal and lateral views of the left wrist are obtained. Cast material obscures underlying bony detail. Volar plate and screw fixation traverses the previously seen distal left radial fracture. Alignment is near anatomic. Minimal callus formation. Decreased soft tissue edema. IMPRESSION: 1. ORIF distal left radial fracture, with minimal interval healing. 2. Decreased soft tissue edema. Electronically Signed   By: Randa Ngo M.D.   On: 06/29/2022 16:25   No results for input(s): "WBC", "HGB", "HCT", "PLT" in the last 72 hours.  Recent Labs    06/28/22 1217  NA 139  K 3.7  CL 103  CO2 24  GLUCOSE 100*   BUN 14  CREATININE 0.63  CALCIUM 9.1    Intake/Output Summary (Last 24 hours) at 06/30/2022 1002 Last data filed at 06/30/2022 0700 Gross per 24 hour  Intake 660 ml  Output 800 ml  Net -140 ml        Physical Exam: Vital Signs Blood pressure 129/64, pulse 78, temperature 98.1 F (36.7 C), temperature source Oral, resp. rate 17, height '5\' 5"'$  (1.651 m), weight 116.8 kg, SpO2 97 %. Gen: no distress, normal appearing, BMI 42.85, fatigued with exercise HEENT: oral mucosa pink and moist, NCAT Cardio: Reg rate Chest: normal effort, normal rate of breathing Abd: soft, non-distended Ext: + lower extremity edema Psych: pleasant, normal affect Skin: Splint left wrist--NV intact. Left knee and shin with incisions - C/D/I , please see image Neuro: Alert and oriented x 4, follows commands, cranial nerves II through XII intact, normal speech and language Strength 5 out of 5 in right upper extremity and right lower extremity Strength 5 out of 5 left shoulder abduction, at least 4 out of 5 elbow flexion extension, 3 out of 5 finger flexion and extension Strength 2-3 out of 5 proximal left lower extremity, strength at least 3 out of 5 distal left lower extremity MMT-limited by weightbearing restrictions Sensation to light touch intact in all 4 extremities however sensation decreased along right anterior shin-patient reports this is from a prior surgery  Musculoskeletal: Appropriately tender left lower extremity Left lower extremity pain with movement at the hip and knee    Assessment/Plan: 1. Functional deficits which require 3+ hours per day of interdisciplinary therapy in a comprehensive inpatient rehab setting. Physiatrist is providing close team supervision and 24 hour management of active medical problems listed below. Physiatrist and rehab team continue to assess barriers to discharge/monitor patient progress toward functional and medical goals  Care Tool:  Bathing    Body parts bathed  by patient: Right arm, Left arm, Left upper leg, Right lower leg, Chest, Abdomen, Left lower leg, Front perineal area, Face, Right upper leg, Buttocks   Body parts bathed by helper: Buttocks     Bathing assist Assist Level: Set up assist     Upper Body Dressing/Undressing Upper body dressing   What is the patient wearing?: Pull over shirt    Upper body assist Assist Level: Set up assist    Lower Body Dressing/Undressing Lower body dressing      What is the patient wearing?: Pants     Lower body assist Assist for lower body dressing: Minimal Assistance - Patient > 75%     Toileting Toileting    Toileting assist Assist for toileting: Minimal Assistance - Patient > 75%     Transfers Chair/bed transfer  Transfers assist     Chair/bed transfer assist level: Supervision/Verbal cueing     Locomotion Ambulation   Ambulation assist   Ambulation activity did not occur: Safety/medical concerns  Assist level: Moderate Assistance - Patient 50 - 74% Assistive device: Walker-platform Max distance: 10   Walk 10 feet activity   Assist  Walk 10 feet activity did not occur: Safety/medical concerns  Assist level: Moderate Assistance - Patient - 50 - 74% Assistive device: Walker-platform   Walk 50 feet activity   Assist Walk 50 feet with 2 turns activity did not occur: Safety/medical concerns (pain, decreased balance)         Walk 150 feet activity   Assist Walk 150 feet activity did not occur: Safety/medical concerns (pain, decreased balance)         Walk 10 feet on uneven surface  activity   Assist Walk 10 feet on uneven surfaces activity did not occur: Safety/medical concerns (pain, decreased balance)         Wheelchair     Assist Is the patient using a wheelchair?: Yes Type of Wheelchair: Manual    Wheelchair assist level: Supervision/Verbal cueing Max wheelchair distance: 146f    Wheelchair 50 feet with 2 turns  activity    Assist        Assist Level: Supervision/Verbal cueing   Wheelchair 150 feet activity     Assist      Assist Level: Supervision/Verbal cueing   Blood pressure 129/64, pulse 78, temperature 98.1 F (36.7 C), temperature source Oral, resp. rate 17, height '5\' 5"'$  (1.651 m), weight 116.8 kg, SpO2 97 %.  Medical Problem List and Plan: 1. Functional deficits secondary to trauma after a fall with left distal radius fracture, ulnar styloid process fracture,  left proximal fibular shaft fracture and left tibial plateau fracture             -patient may not shower             -ELOS/Goals: 10 to 14 days PT/OT min a to supervision             d/c home 2.  Antithrombotics: -DVT/anticoagulation:  Pharmaceutical: Eliquis 0.5 mg twice daily  for 30 days             -antiplatelet therapy: N/A 3. Pain Management:  continue Robaxin /Hydrocodone prn.  4. Insomnia: due to difficulty positioning herself at night, trapeze bar ordered- asked nursing to follow-up as this was not received 5. Neuropsych/cognition: This patient is capable of making decisions on her own behalf. 6. Skin/Wound Care: Routine pressure relief measures. Check lytes in am.  7. Fluids/Electrolytes/Nutrition: Monitor I/O. Check CMET in am.  8. Left tibial plateau Fx s/p ORIF: NWB LLE. Discussed that XR results are stable.  9. Distal radius/ulna styloid Fx: NWB wrist and WBAT thru left wrist. Asked ortho to consult and repeat Xrs prior to d/c 10. Vitamin D deficiency: Vit D-17.18 and started on ergocalciferol on 02/26, continue for 7 weeks 11. ABLA: HGB 10.6 06/26/22. Recheck in am--improving.  12. Hypoalbuminemia: Alb-2.7. Will add protein supplements. Encouraged choosing high protein foods 13. Morbid Obesity. BMI 40.98-->42.85. Dietary counseling.  14.  Rhabdomyolysis improved with IV hydration 15.  Elevated bilirubin.  Appears to be improving.  Reviewed 3/5 and is stable 16. Hypokalemia: 36mq klor ordered 3/5,  repeat BMP ordered 3/6. Improved, repeat outpatient.  17. Hyperglycemia: HgbA1c reviewed from 3/5 and is 5.5. Improving 18. Lower extremity edema: monitor daily.    >30 minutes spent in discharge of patient including review of medications and follow-up appointments, physical examination, and in answering all patient's questions   LOS: 4 days A FACE TO FACE EVAL[place]2022, 10:02 AM

## 2022-07-03 DIAGNOSIS — E876 Hypokalemia: Secondary | ICD-10-CM | POA: Insufficient documentation

## 2022-07-03 DIAGNOSIS — D62 Acute posthemorrhagic anemia: Secondary | ICD-10-CM | POA: Insufficient documentation

## 2022-07-05 DIAGNOSIS — Z7901 Long term (current) use of anticoagulants: Secondary | ICD-10-CM | POA: Diagnosis not present

## 2022-07-05 DIAGNOSIS — S52615D Nondisplaced fracture of left ulna styloid process, subsequent encounter for closed fracture with routine healing: Secondary | ICD-10-CM | POA: Diagnosis not present

## 2022-07-05 DIAGNOSIS — D62 Acute posthemorrhagic anemia: Secondary | ICD-10-CM | POA: Diagnosis not present

## 2022-07-05 DIAGNOSIS — Z6839 Body mass index (BMI) 39.0-39.9, adult: Secondary | ICD-10-CM | POA: Diagnosis not present

## 2022-07-05 DIAGNOSIS — S52502D Unspecified fracture of the lower end of left radius, subsequent encounter for closed fracture with routine healing: Secondary | ICD-10-CM | POA: Diagnosis not present

## 2022-07-05 DIAGNOSIS — S82832D Other fracture of upper and lower end of left fibula, subsequent encounter for closed fracture with routine healing: Secondary | ICD-10-CM | POA: Diagnosis not present

## 2022-07-05 DIAGNOSIS — Z9181 History of falling: Secondary | ICD-10-CM | POA: Diagnosis not present

## 2022-07-05 DIAGNOSIS — I890 Lymphedema, not elsewhere classified: Secondary | ICD-10-CM | POA: Diagnosis not present

## 2022-07-05 DIAGNOSIS — S82142D Displaced bicondylar fracture of left tibia, subsequent encounter for closed fracture with routine healing: Secondary | ICD-10-CM | POA: Diagnosis not present

## 2022-07-05 DIAGNOSIS — Z993 Dependence on wheelchair: Secondary | ICD-10-CM | POA: Diagnosis not present

## 2022-07-05 DIAGNOSIS — M810 Age-related osteoporosis without current pathological fracture: Secondary | ICD-10-CM | POA: Diagnosis not present

## 2022-07-05 DIAGNOSIS — I509 Heart failure, unspecified: Secondary | ICD-10-CM | POA: Diagnosis not present

## 2022-07-06 ENCOUNTER — Encounter: Payer: Self-pay | Admitting: Family Medicine

## 2022-07-12 NOTE — Progress Notes (Signed)
Established patient visit   Patient: Megan Walter   DOB: 12-Dec-1945   77 y.o. Female  MRN: [id number] Visit Date: 07/13/2022  Today's healthcare provider: Charlton Amor, DO   Chief Complaint  Patient presents with   Anxiety    SUBJECTIVE    Chief Complaint  Patient presents with   Anxiety   HPI  I connected with  Megan Walter on 07/13/22 by a video and audio enabled telemedicine application and verified that I am speaking with the correct person using two identifiers.  Patient Location: Home  Provider Location: Office/Clinic  I discussed the limitations of evaluation and management by telemedicine. The patient expressed understanding and agreed to proceed.   Pt presents via telehealth to discuss anxiety. She recently fell in February and broke her left tibia, fibula, and wrist. Due to mobility issues, she is becoming more anxious in the evening times.  She is wondering if there is anything she can take for anxiety.  Review of Systems  Constitutional:  Negative for activity change, fatigue and fever.  Respiratory:  Negative for cough and shortness of breath.   Cardiovascular:  Negative for chest pain.  Gastrointestinal:  Negative for abdominal pain.  Genitourinary:  Negative for difficulty urinating.  Psychiatric/Behavioral:  The patient is nervous/anxious.        Current Meds  Medication Sig   apixaban (ELIQUIS) 2.5 MG TABS tablet Take 1 tablet (2.5 mg total) by mouth 2 (two) times daily.   busPIRone (BUSPAR) 5 MG tablet Take 1 tablet (5 mg total) by mouth 2 (two) times daily.   calamine lotion Apply 1 Application topically 2 (two) times daily.   calcium citrate (CALCITRATE - DOSED IN MG ELEMENTAL CALCIUM) 950 MG tablet Take 1 tablet by mouth daily.   cetirizine (ZYRTEC) 10 MG tablet Take 1 tablet (10 mg total) by mouth daily.   fluticasone (FLONASE) 50 MCG/ACT nasal spray Place 2 sprays into both nostrils daily.   HYDROcodone-acetaminophen (NORCO/VICODIN)  5-325 MG tablet Take 1-2 tablets by mouth every 8 (eight) hours as needed for moderate pain.   melatonin 3 MG TABS tablet Take 1 tablet (3 mg total) by mouth at bedtime.   methocarbamol (ROBAXIN) 500 MG tablet Take 1 tablet (500 mg total) by mouth every 6 (six) hours as needed for muscle spasms.   sertraline (ZOLOFT) 25 MG tablet Take 1 tablet (25 mg total) by mouth daily.   Vitamin D, Ergocalciferol, (DRISDOL) 1.25 MG (50000 UNIT) CAPS capsule Take 1 capsule (50,000 Units total) by mouth every Monday.    OBJECTIVE    There were no vitals taken for this visit.  Physical Exam Vitals reviewed.  Constitutional:      Appearance: She is well-developed.  HENT:     Head: Normocephalic and atraumatic.  Eyes:     Conjunctiva/sclera: Conjunctivae normal.  Cardiovascular:     Rate and Rhythm: Normal rate.  Pulmonary:     Effort: Pulmonary effort is normal.  Skin:    General: Skin is dry.     Coloration: Skin is not pale.  Neurological:     Mental Status: She is alert and oriented to person, place, and time.  Psychiatric:        Behavior: Behavior normal.          ASSESSMENT & PLAN    Problem List Items Addressed This Visit       Other   Generalized anxiety disorder - Primary    - Discussed anxiety  and patient is open to medication.  Will go ahead and prescribe zoloft 25 mg as well as BuSpar 5 mg.  Discussed how to take each medication as well as side effects.  Told patient to reach out if she has any trouble with these medications.  If no issues, can follow-up in 4 to 6 weeks for efficacy via MyChart or in person. 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  This is why I have chosen Zoloft.      Relevant Medications   sertraline (ZOLOFT) 25 MG tablet   busPIRone (BUSPAR) 5 MG tablet   Other Visit Diagnoses     Screening for osteoporosis       Relevant Orders   DG Bone Density       No follow-ups on file.      Meds ordered this encounter  Medications    sertraline (ZOLOFT) 25 MG tablet    Sig: Take 1 tablet (25 mg total) by mouth daily.    Dispense:  30 tablet    Refill:  3   busPIRone (BUSPAR) 5 MG tablet    Sig: Take 1 tablet (5 mg total) by mouth 2 (two) times daily.    Dispense:  20 tablet    Refill:  0    Orders Placed This Encounter  Procedures   DG Bone Density    Standing Status:   Future    Standing Expiration Date:   07/12/2023    Order Specific Question:   Reason for Exam (SYMPTOM  OR DIAGNOSIS REQUIRED)    Answer:   Eval bone density    Order Specific Question:   Preferred imaging location?    Answer:   [facility] Don[facility] Clay, DO  [facility] at [facility] [phone] (phone) [phone] (fax)  [facility]

## 2022-07-13 ENCOUNTER — Encounter: Payer: Self-pay | Admitting: Family Medicine

## 2022-07-13 ENCOUNTER — Telehealth (INDEPENDENT_AMBULATORY_CARE_PROVIDER_SITE_OTHER): Payer: Medicare Other | Admitting: Family Medicine

## 2022-07-13 DIAGNOSIS — F411 Generalized anxiety disorder: Secondary | ICD-10-CM | POA: Diagnosis not present

## 2022-07-13 DIAGNOSIS — Z13820 Encounter for screening for osteoporosis: Secondary | ICD-10-CM | POA: Diagnosis not present

## 2022-07-13 MED ORDER — SERTRALINE HCL 25 MG PO TABS: 25.0000 mg | ORAL_TABLET | Freq: Every day | ORAL | 3 refills | Status: DC

## 2022-07-13 MED ORDER — BUSPIRONE HCL 5 MG PO TABS: 5.0000 mg | ORAL_TABLET | Freq: Two times a day (BID) | ORAL | 0 refills | Status: DC

## 2022-07-13 NOTE — Assessment & Plan Note (Addendum)
-   Discussed anxiety and patient is open to medication.  Will go ahead and prescribe zoloft 25 mg as well as BuSpar 5 mg.  Discussed how to take each medication as well as side effects.  Told patient to reach out if she has any trouble with these medications.  If no issues, can follow-up in 4 to 6 weeks for efficacy via MyChart or in person. 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  This is why I have chosen Zoloft.

## 2022-07-18 ENCOUNTER — Inpatient Hospital Stay: Payer: Medicare Other | Admitting: Physical Medicine and Rehabilitation

## 2022-07-18 DIAGNOSIS — S82142D Displaced bicondylar fracture of left tibia, subsequent encounter for closed fracture with routine healing: Secondary | ICD-10-CM | POA: Diagnosis not present

## 2022-07-18 DIAGNOSIS — S52502D Unspecified fracture of the lower end of left radius, subsequent encounter for closed fracture with routine healing: Secondary | ICD-10-CM | POA: Diagnosis not present

## 2022-07-19 DIAGNOSIS — I890 Lymphedema, not elsewhere classified: Secondary | ICD-10-CM | POA: Diagnosis not present

## 2022-07-19 DIAGNOSIS — D62 Acute posthemorrhagic anemia: Secondary | ICD-10-CM | POA: Diagnosis not present

## 2022-07-19 DIAGNOSIS — Z7901 Long term (current) use of anticoagulants: Secondary | ICD-10-CM | POA: Diagnosis not present

## 2022-07-19 DIAGNOSIS — I509 Heart failure, unspecified: Secondary | ICD-10-CM | POA: Diagnosis not present

## 2022-07-19 DIAGNOSIS — S52502D Unspecified fracture of the lower end of left radius, subsequent encounter for closed fracture with routine healing: Secondary | ICD-10-CM | POA: Diagnosis not present

## 2022-07-19 DIAGNOSIS — Z9181 History of falling: Secondary | ICD-10-CM | POA: Diagnosis not present

## 2022-07-19 DIAGNOSIS — S82142D Displaced bicondylar fracture of left tibia, subsequent encounter for closed fracture with routine healing: Secondary | ICD-10-CM | POA: Diagnosis not present

## 2022-07-19 DIAGNOSIS — Z6839 Body mass index (BMI) 39.0-39.9, adult: Secondary | ICD-10-CM | POA: Diagnosis not present

## 2022-07-19 DIAGNOSIS — S82832D Other fracture of upper and lower end of left fibula, subsequent encounter for closed fracture with routine healing: Secondary | ICD-10-CM | POA: Diagnosis not present

## 2022-07-19 DIAGNOSIS — S52615D Nondisplaced fracture of left ulna styloid process, subsequent encounter for closed fracture with routine healing: Secondary | ICD-10-CM | POA: Diagnosis not present

## 2022-07-19 DIAGNOSIS — M810 Age-related osteoporosis without current pathological fracture: Secondary | ICD-10-CM | POA: Diagnosis not present

## 2022-07-19 DIAGNOSIS — Z993 Dependence on wheelchair: Secondary | ICD-10-CM | POA: Diagnosis not present

## 2022-07-20 ENCOUNTER — Other Ambulatory Visit (HOSPITAL_COMMUNITY): Payer: Self-pay | Admitting: Student

## 2022-07-20 ENCOUNTER — Other Ambulatory Visit: Payer: Self-pay | Admitting: Student

## 2022-07-20 DIAGNOSIS — S52502D Unspecified fracture of the lower end of left radius, subsequent encounter for closed fracture with routine healing: Secondary | ICD-10-CM

## 2022-07-26 ENCOUNTER — Other Ambulatory Visit: Payer: Self-pay | Admitting: Family Medicine

## 2022-07-27 MED ORDER — BUSPIRONE HCL 5 MG PO TABS: 5.0000 mg | ORAL_TABLET | Freq: Two times a day (BID) | ORAL | 0 refills | Status: DC

## 2022-07-28 ENCOUNTER — Other Ambulatory Visit: Payer: Medicare Other

## 2022-07-30 DIAGNOSIS — S76102A Unspecified injury of left quadriceps muscle, fascia and tendon, initial encounter: Secondary | ICD-10-CM | POA: Diagnosis not present

## 2022-07-30 DIAGNOSIS — M810 Age-related osteoporosis without current pathological fracture: Secondary | ICD-10-CM | POA: Diagnosis not present

## 2022-07-30 DIAGNOSIS — S62102A Fracture of unspecified carpal bone, left wrist, initial encounter for closed fracture: Secondary | ICD-10-CM | POA: Diagnosis not present

## 2022-07-30 DIAGNOSIS — S82202A Unspecified fracture of shaft of left tibia, initial encounter for closed fracture: Secondary | ICD-10-CM | POA: Diagnosis not present

## 2022-08-03 ENCOUNTER — Ambulatory Visit (HOSPITAL_COMMUNITY)
Admission: RE | Admit: 2022-08-03 | Discharge: 2022-08-03 | Disposition: A | Discharge status: Home / Self Care | Payer: Medicare Other | Source: Ambulatory Visit | Attending: Student | Admitting: Student

## 2022-08-03 DIAGNOSIS — S52502D Unspecified fracture of the lower end of left radius, subsequent encounter for closed fracture with routine healing: Secondary | ICD-10-CM | POA: Insufficient documentation

## 2022-08-03 DIAGNOSIS — S52602D Unspecified fracture of lower end of left ulna, subsequent encounter for closed fracture with routine healing: Secondary | ICD-10-CM | POA: Insufficient documentation

## 2022-08-03 DIAGNOSIS — S6982XA Other specified injuries of left wrist, hand and finger(s), initial encounter: Secondary | ICD-10-CM | POA: Diagnosis not present

## 2022-08-03 DIAGNOSIS — Z4789 Encounter for other orthopedic aftercare: Secondary | ICD-10-CM | POA: Diagnosis not present

## 2022-08-04 DIAGNOSIS — Z9181 History of falling: Secondary | ICD-10-CM | POA: Diagnosis not present

## 2022-08-04 DIAGNOSIS — Z7901 Long term (current) use of anticoagulants: Secondary | ICD-10-CM | POA: Diagnosis not present

## 2022-08-04 DIAGNOSIS — Z993 Dependence on wheelchair: Secondary | ICD-10-CM | POA: Diagnosis not present

## 2022-08-04 DIAGNOSIS — S82832D Other fracture of upper and lower end of left fibula, subsequent encounter for closed fracture with routine healing: Secondary | ICD-10-CM | POA: Diagnosis not present

## 2022-08-04 DIAGNOSIS — D62 Acute posthemorrhagic anemia: Secondary | ICD-10-CM | POA: Diagnosis not present

## 2022-08-04 DIAGNOSIS — S52615D Nondisplaced fracture of left ulna styloid process, subsequent encounter for closed fracture with routine healing: Secondary | ICD-10-CM | POA: Diagnosis not present

## 2022-08-04 DIAGNOSIS — Z6839 Body mass index (BMI) 39.0-39.9, adult: Secondary | ICD-10-CM | POA: Diagnosis not present

## 2022-08-04 DIAGNOSIS — M810 Age-related osteoporosis without current pathological fracture: Secondary | ICD-10-CM | POA: Diagnosis not present

## 2022-08-04 DIAGNOSIS — I509 Heart failure, unspecified: Secondary | ICD-10-CM | POA: Diagnosis not present

## 2022-08-04 DIAGNOSIS — I890 Lymphedema, not elsewhere classified: Secondary | ICD-10-CM | POA: Diagnosis not present

## 2022-08-04 DIAGNOSIS — S82142D Displaced bicondylar fracture of left tibia, subsequent encounter for closed fracture with routine healing: Secondary | ICD-10-CM | POA: Diagnosis not present

## 2022-08-04 DIAGNOSIS — S52502D Unspecified fracture of the lower end of left radius, subsequent encounter for closed fracture with routine healing: Secondary | ICD-10-CM | POA: Diagnosis not present

## 2022-08-08 ENCOUNTER — Ambulatory Visit: Payer: Self-pay | Admitting: Student

## 2022-08-08 DIAGNOSIS — S82142D Displaced bicondylar fracture of left tibia, subsequent encounter for closed fracture with routine healing: Secondary | ICD-10-CM | POA: Diagnosis not present

## 2022-08-08 DIAGNOSIS — D62 Acute posthemorrhagic anemia: Secondary | ICD-10-CM | POA: Diagnosis not present

## 2022-08-08 DIAGNOSIS — Z993 Dependence on wheelchair: Secondary | ICD-10-CM | POA: Diagnosis not present

## 2022-08-08 DIAGNOSIS — Z9181 History of falling: Secondary | ICD-10-CM | POA: Diagnosis not present

## 2022-08-08 DIAGNOSIS — S52615D Nondisplaced fracture of left ulna styloid process, subsequent encounter for closed fracture with routine healing: Secondary | ICD-10-CM | POA: Diagnosis not present

## 2022-08-08 DIAGNOSIS — Z7901 Long term (current) use of anticoagulants: Secondary | ICD-10-CM | POA: Diagnosis not present

## 2022-08-08 DIAGNOSIS — Z6839 Body mass index (BMI) 39.0-39.9, adult: Secondary | ICD-10-CM | POA: Diagnosis not present

## 2022-08-08 DIAGNOSIS — I509 Heart failure, unspecified: Secondary | ICD-10-CM | POA: Diagnosis not present

## 2022-08-08 DIAGNOSIS — S52502D Unspecified fracture of the lower end of left radius, subsequent encounter for closed fracture with routine healing: Secondary | ICD-10-CM | POA: Diagnosis not present

## 2022-08-08 DIAGNOSIS — I890 Lymphedema, not elsewhere classified: Secondary | ICD-10-CM | POA: Diagnosis not present

## 2022-08-08 DIAGNOSIS — M810 Age-related osteoporosis without current pathological fracture: Secondary | ICD-10-CM | POA: Diagnosis not present

## 2022-08-08 DIAGNOSIS — S82832D Other fracture of upper and lower end of left fibula, subsequent encounter for closed fracture with routine healing: Secondary | ICD-10-CM | POA: Diagnosis not present

## 2022-08-08 NOTE — H&P (Signed)
[facility]opaedic [facility]uma Service (OTS) H&P  Patient ID: Megan Walter MRN: [id number] DOB/AGE: May 13, 1945 78 y.o.  Reason for Surgery: hardware removal left wrist  HPI: Megan Walter is an 77 y.o. female with PMH significant for osteoporosis with no significant past medical history presenting for surgery on left upper extremity.  Patient sustained a fall in February 2024, resulting in left intra-articular distal radius fracture and left tibial plateau fracture.  Patient underwent surgical fixation of both the left wrist and left tibial plateau by Dr. Jena Gauss on 06/16/2022. Patient was placed in a plaster volar splint post-operatively and instructed to be NWB through the wrist and WBAT through the left elbow.  Patient had a short stay in CIR following her acute hospitalization.  She was transitioned to a removable wrist splint 2 weeks post op. Repeat imaging at 2 weeks post op showed some collapse through the distal radius fracture with concern that screws may be violating the wrist joint. This was again seen on follow up imaging on 07/18/22 when patient was seen in OTS clinic.  CT scan on 08/03/2022 confirmed distal radius fracture was fully healed but did show that 2 screws extended into the radioscaphoid joint.  Patient presents now for removal of all hardware.  She has been wearing her removable wrist regularly.  Pain in the wrist has been manageable.  She has avoided lifting with the left upper extremity. Denies any numbness or tingling throughout the left wrist or hand.  She remains on aspirin for DVT prophylaxis.  No past medical history on file.  Past Surgical History:  Procedure Laterality Date   ANKLE FRACTURE SURGERY Left    hardware   CATARACT EXTRACTION Right    ORIF PROXIMAL TIBIAL PLATEAU FRACTURE Right    hardware   ORIF TIBIA PLATEAU Left 06/16/2022   Procedure: OPEN REDUCTION INTERNAL FIXATION (ORIF) TIBIAL PLATEAU;  Surgeon: Roby Lofts, MD;  Location: [facility] OR;  Service: Orthopedics;   Laterality: Left;   ORIF WRIST FRACTURE Left 06/16/2022   Procedure: OPEN REDUCTION INTERNAL FIXATION (ORIF) WRIST FRACTURE;  Surgeon: Roby Lofts, MD;  Location: [facility] OR;  Service: Orthopedics;  Laterality: Left;   WRIST FRACTURE SURGERY Right    hardware    Family History  Problem Relation Age of Onset   Hypertension Mother    Heart attack Mother     Social History:  reports that she has never smoked. She does not have any smokeless tobacco history on file. She reports that she does not drink alcohol and does not use drugs.  Allergies: No Known Allergies  Medications: I have reviewed the patient's current medications. Prior to Admission:  No medications prior to admission.    ROS: Constitutional: No fever or chills Vision: No changes in vision ENT: No difficulty swallowing CV: No chest pain Pulm: No SOB or wheezing GI: No nausea or vomiting GU: No urgency or inability to hold urine Skin: No poor wound healing Neurologic: No numbness or tingling Psychiatric: No depression or anxiety Heme: No bruising Allergic: No reaction to medications or food   Exam: There were no vitals taken for this visit. General:NAD Orientation:A&O x4 Mood and Affect: Mood and affect appropriate. Pleasant and coopertaive Gait: NWB LLE Coordination and balance: WNL  LUE: Well healed surgical incision. Mildly tender through the dorsal surface of the radiocarpal joint. Less tender over the volar surface. Tolerates gentle wrist flexion/extension. Able to mae a fist. Endorses sensation throughout all fingers. +Radial pulse  RUE: Skin without lesions. No  tenderness to palpation. Full painless ROM, full strength in each muscle groups without evidence of instability.   Medical Decision Making: Data: Imaging: CT scan left wrist shows healed distal radius fracture. Ulnar styloid process fracture not fully healed. Two screws in the distal portion of the place are extending into the radioscaphoid  joint  Labs: No results found for this or any previous visit (from the past 168 hour(s)).   Assessment/Plan: 77 year old female status post ORIF left distal radius fracture 06/16/2022, presenting for hardware removal  CT scan left wrist performed on 08/03/2022 confirms that two of the screws extend into the radioscaphoid joint.  The distal radius fracture is fully healed.  At this point I would recommend proceeding with hardware removal from the left wrist.  Risk and benefits of the procedure have discussed with the patient and her daughter. Risks discussed included bleeding, infection, repeat fracture to the distal radius, damage to surrounding nerves and blood vessels, pain, stiffness, post-traumatic arthritis, and anesthesia complications. Patient agrees to proceed with surgery.  Consent will be obtained.  We will plan to discharge patient home postoperatively from the PACU and allow her unrestricted range of motion of the left wrist  Thompson Caul PA-C [facility] [phone] (office) [email or web address]

## 2022-08-10 ENCOUNTER — Encounter (HOSPITAL_COMMUNITY): Payer: Self-pay | Admitting: Student

## 2022-08-10 ENCOUNTER — Other Ambulatory Visit: Payer: Self-pay

## 2022-08-10 NOTE — Progress Notes (Signed)
Spoke with pt for pre-op call. Pt denies cardiac history, HTN or Diabetes.   Shower instructions given to pt. 

## 2022-08-11 ENCOUNTER — Ambulatory Visit (HOSPITAL_BASED_OUTPATIENT_CLINIC_OR_DEPARTMENT_OTHER): Payer: Medicare Other | Admitting: Anesthesiology

## 2022-08-11 ENCOUNTER — Ambulatory Visit (HOSPITAL_COMMUNITY)
Admission: RE | Admit: 2022-08-11 | Discharge: 2022-08-11 | Disposition: A | Discharge status: Home / Self Care | Payer: Medicare Other | Attending: Student | Admitting: Student

## 2022-08-11 ENCOUNTER — Encounter (HOSPITAL_COMMUNITY): Payer: Self-pay | Admitting: Student

## 2022-08-11 ENCOUNTER — Ambulatory Visit (HOSPITAL_COMMUNITY): Payer: Medicare Other | Admitting: Anesthesiology

## 2022-08-11 ENCOUNTER — Encounter (HOSPITAL_COMMUNITY)
Admission: RE | Disposition: A | Discharge status: Home / Self Care | Payer: Self-pay | Source: Home / Self Care | Attending: Student

## 2022-08-11 ENCOUNTER — Ambulatory Visit (HOSPITAL_COMMUNITY): Payer: Medicare Other

## 2022-08-11 ENCOUNTER — Other Ambulatory Visit: Payer: Self-pay

## 2022-08-11 DIAGNOSIS — T8484XA Pain due to internal orthopedic prosthetic devices, implants and grafts, initial encounter: Secondary | ICD-10-CM | POA: Diagnosis not present

## 2022-08-11 DIAGNOSIS — X58XXXA Exposure to other specified factors, initial encounter: Secondary | ICD-10-CM | POA: Insufficient documentation

## 2022-08-11 DIAGNOSIS — F419 Anxiety disorder, unspecified: Secondary | ICD-10-CM | POA: Diagnosis not present

## 2022-08-11 DIAGNOSIS — T84018A Broken internal joint prosthesis, other site, initial encounter: Secondary | ICD-10-CM

## 2022-08-11 DIAGNOSIS — T8489XA Other specified complication of internal orthopedic prosthetic devices, implants and grafts, initial encounter: Secondary | ICD-10-CM | POA: Diagnosis not present

## 2022-08-11 DIAGNOSIS — Z9889 Other specified postprocedural states: Secondary | ICD-10-CM | POA: Diagnosis not present

## 2022-08-11 DIAGNOSIS — M810 Age-related osteoporosis without current pathological fracture: Secondary | ICD-10-CM | POA: Diagnosis not present

## 2022-08-11 DIAGNOSIS — Z6841 Body Mass Index (BMI) 40.0 and over, adult: Secondary | ICD-10-CM

## 2022-08-11 DIAGNOSIS — S52502A Unspecified fracture of the lower end of left radius, initial encounter for closed fracture: Secondary | ICD-10-CM | POA: Diagnosis not present

## 2022-08-11 HISTORY — DX: Anxiety disorder, unspecified: F41.9

## 2022-08-11 HISTORY — PX: HARDWARE REMOVAL: SHX979

## 2022-08-11 LAB — CBC
HCT: 43.3 % (ref 36.0–46.0)
Hemoglobin: 14.2 g/dL (ref 12.0–15.0)
MCH: 27.8 pg (ref 26.0–34.0)
MCHC: 32.8 g/dL (ref 30.0–36.0)
MCV: 84.9 fL (ref 80.0–100.0)
Platelets: 255 10*3/uL (ref 150–400)
RBC: 5.1 MIL/uL (ref 3.87–5.11)
RDW: 13.5 % (ref 11.5–15.5)
WBC: 6 10*3/uL (ref 4.0–10.5)
nRBC: 0 % (ref 0.0–0.2)

## 2022-08-11 SURGERY — REMOVAL, HARDWARE
Anesthesia: General | Site: Wrist | Laterality: Left

## 2022-08-11 MED ORDER — ORAL CARE MOUTH RINSE: 15.0000 mL | Freq: Once | OROMUCOSAL | Status: AC

## 2022-08-11 MED ORDER — BUPIVACAINE HCL (PF) 0.5 % IJ SOLN
INTRAMUSCULAR | Status: AC
  Filled 2022-08-11: qty 10

## 2022-08-11 MED ORDER — CHLORHEXIDINE GLUCONATE 0.12 % MT SOLN
15.0000 mL | Freq: Once | OROMUCOSAL | Status: AC
  Administered 2022-08-11: 15 mL via OROMUCOSAL
  Filled 2022-08-11: qty 15

## 2022-08-11 MED ORDER — EPHEDRINE SULFATE-NACL 50-0.9 MG/10ML-% IV SOSY
PREFILLED_SYRINGE | INTRAVENOUS | Status: DC | PRN
  Administered 2022-08-11: 5 mg via INTRAVENOUS
  Administered 2022-08-11: 10 mg via INTRAVENOUS

## 2022-08-11 MED ORDER — 0.9 % SODIUM CHLORIDE (POUR BTL) OPTIME
TOPICAL | Status: DC | PRN
  Administered 2022-08-11: 1000 mL

## 2022-08-11 MED ORDER — DEXAMETHASONE SODIUM PHOSPHATE 10 MG/ML IJ SOLN
INTRAMUSCULAR | Status: DC | PRN
  Administered 2022-08-11: 4 mg via INTRAVENOUS

## 2022-08-11 MED ORDER — BUPIVACAINE HCL (PF) 0.5 % IJ SOLN
INTRAMUSCULAR | Status: AC
  Filled 2022-08-11: qty 30

## 2022-08-11 MED ORDER — PROPOFOL 10 MG/ML IV BOLUS
INTRAVENOUS | Status: AC
  Filled 2022-08-11: qty 20

## 2022-08-11 MED ORDER — ACETAMINOPHEN 500 MG PO TABS
1000.0000 mg | ORAL_TABLET | Freq: Once | ORAL | Status: AC
  Administered 2022-08-11: 1000 mg via ORAL
  Filled 2022-08-11: qty 2

## 2022-08-11 MED ORDER — ACETAMINOPHEN 10 MG/ML IV SOLN
INTRAVENOUS | Status: AC
  Filled 2022-08-11: qty 100

## 2022-08-11 MED ORDER — ONDANSETRON HCL 4 MG/2ML IJ SOLN
INTRAMUSCULAR | Status: AC
  Filled 2022-08-11: qty 2

## 2022-08-11 MED ORDER — ONDANSETRON HCL 4 MG/2ML IJ SOLN
INTRAMUSCULAR | Status: DC | PRN
  Administered 2022-08-11: 4 mg via INTRAVENOUS

## 2022-08-11 MED ORDER — HYDROCODONE-ACETAMINOPHEN 5-325 MG PO TABS: 1.0000 | ORAL_TABLET | Freq: Four times a day (QID) | ORAL | 0 refills | Status: DC | PRN

## 2022-08-11 MED ORDER — FENTANYL CITRATE (PF) 250 MCG/5ML IJ SOLN
INTRAMUSCULAR | Status: DC | PRN
  Administered 2022-08-11: 25 ug via INTRAVENOUS
  Administered 2022-08-11: 100 ug via INTRAVENOUS
  Administered 2022-08-11: 25 ug via INTRAVENOUS
  Administered 2022-08-11: 50 ug via INTRAVENOUS

## 2022-08-11 MED ORDER — VANCOMYCIN HCL 1000 MG IV SOLR
INTRAVENOUS | Status: AC
  Filled 2022-08-11: qty 20

## 2022-08-11 MED ORDER — LIDOCAINE 2% (20 MG/ML) 5 ML SYRINGE
INTRAMUSCULAR | Status: DC | PRN
  Administered 2022-08-11: 40 mg via INTRAVENOUS

## 2022-08-11 MED ORDER — EPHEDRINE 5 MG/ML INJ
INTRAVENOUS | Status: AC
  Filled 2022-08-11: qty 5

## 2022-08-11 MED ORDER — PROPOFOL 10 MG/ML IV BOLUS
INTRAVENOUS | Status: DC | PRN
  Administered 2022-08-11: 150 mg via INTRAVENOUS

## 2022-08-11 MED ORDER — BUPIVACAINE HCL 0.5 % IJ SOLN
INTRAMUSCULAR | Status: DC | PRN
  Administered 2022-08-11: 12 mL

## 2022-08-11 MED ORDER — DEXAMETHASONE SODIUM PHOSPHATE 10 MG/ML IJ SOLN
INTRAMUSCULAR | Status: AC
  Filled 2022-08-11: qty 1

## 2022-08-11 MED ORDER — PHENYLEPHRINE HCL-NACL 20-0.9 MG/250ML-% IV SOLN
INTRAVENOUS | Status: DC | PRN
  Administered 2022-08-11: 50 ug/min via INTRAVENOUS

## 2022-08-11 MED ORDER — LIDOCAINE 2% (20 MG/ML) 5 ML SYRINGE
INTRAMUSCULAR | Status: AC
  Filled 2022-08-11: qty 5

## 2022-08-11 MED ORDER — PHENYLEPHRINE 80 MCG/ML (10ML) SYRINGE FOR IV PUSH (FOR BLOOD PRESSURE SUPPORT)
PREFILLED_SYRINGE | INTRAVENOUS | Status: AC
  Filled 2022-08-11: qty 10

## 2022-08-11 MED ORDER — HYDROMORPHONE HCL 1 MG/ML IJ SOLN
INTRAMUSCULAR | Status: DC | PRN
  Administered 2022-08-11: .5 mg via INTRAVENOUS

## 2022-08-11 MED ORDER — FENTANYL CITRATE (PF) 100 MCG/2ML IJ SOLN: 25.0000 ug | INTRAMUSCULAR | Status: DC | PRN

## 2022-08-11 MED ORDER — VANCOMYCIN HCL 1000 MG IV SOLR
INTRAVENOUS | Status: DC | PRN
  Administered 2022-08-11: 1000 mg via TOPICAL

## 2022-08-11 MED ORDER — HYDROMORPHONE HCL 1 MG/ML IJ SOLN
INTRAMUSCULAR | Status: AC
  Filled 2022-08-11: qty 0.5

## 2022-08-11 MED ORDER — CEFAZOLIN SODIUM-DEXTROSE 2-4 GM/100ML-% IV SOLN
2.0000 g | INTRAVENOUS | Status: AC
  Administered 2022-08-11: 2 g via INTRAVENOUS
  Filled 2022-08-11: qty 100

## 2022-08-11 MED ORDER — FENTANYL CITRATE (PF) 250 MCG/5ML IJ SOLN
INTRAMUSCULAR | Status: AC
  Filled 2022-08-11: qty 5

## 2022-08-11 MED ORDER — LACTATED RINGERS IV SOLN: INTRAVENOUS | Status: DC

## 2022-08-11 MED ORDER — PHENYLEPHRINE 80 MCG/ML (10ML) SYRINGE FOR IV PUSH (FOR BLOOD PRESSURE SUPPORT)
PREFILLED_SYRINGE | INTRAVENOUS | Status: DC | PRN
  Administered 2022-08-11 (×2): 160 ug via INTRAVENOUS

## 2022-08-11 SURGICAL SUPPLY — 69 items
APL PRP STRL LF DISP 70% ISPRP (MISCELLANEOUS) ×1
APL SKNCLS STERI-STRIP NONHPOA (GAUZE/BANDAGES/DRESSINGS) ×1
BAG COUNTER SPONGE SURGICOUNT (BAG) ×2 IMPLANT
BAG SPNG CNTER NS LX DISP (BAG) ×1
BANDAGE ESMARK 6X9 LF (GAUZE/BANDAGES/DRESSINGS) ×2 IMPLANT
BENZOIN TINCTURE PRP APPL 2/3 (GAUZE/BANDAGES/DRESSINGS) IMPLANT
BNDG CMPR 5X3 KNIT ELC UNQ LF (GAUZE/BANDAGES/DRESSINGS) ×1
BNDG CMPR 5X6 CHSV STRCH STRL (GAUZE/BANDAGES/DRESSINGS) ×1
BNDG CMPR 9X6 STRL LF SNTH (GAUZE/BANDAGES/DRESSINGS) ×1
BNDG COHESIVE 6X5 TAN ST LF (GAUZE/BANDAGES/DRESSINGS) ×2 IMPLANT
BNDG ELASTIC 3INX 5YD STR LF (GAUZE/BANDAGES/DRESSINGS) IMPLANT
BNDG ELASTIC 4X5.8 VLCR STR LF (GAUZE/BANDAGES/DRESSINGS) ×2 IMPLANT
BNDG ELASTIC 6X5.8 VLCR STR LF (GAUZE/BANDAGES/DRESSINGS) ×2 IMPLANT
BNDG ESMARK 6X9 LF (GAUZE/BANDAGES/DRESSINGS) ×1
BNDG GAUZE DERMACEA FLUFF 4 (GAUZE/BANDAGES/DRESSINGS) ×4 IMPLANT
BNDG GZE DERMACEA 4 6PLY (GAUZE/BANDAGES/DRESSINGS) ×2
BRUSH SCRUB EZ PLAIN DRY (MISCELLANEOUS) ×4 IMPLANT
CHLORAPREP W/TINT 26 (MISCELLANEOUS) ×2 IMPLANT
COVER SURGICAL LIGHT HANDLE (MISCELLANEOUS) ×4 IMPLANT
CUFF TOURN SGL QUICK 18X4 (TOURNIQUET CUFF) IMPLANT
CUFF TOURN SGL QUICK 24 (TOURNIQUET CUFF)
CUFF TOURN SGL QUICK 34 (TOURNIQUET CUFF)
CUFF TRNQT CYL 24X4X16.5-23 (TOURNIQUET CUFF) IMPLANT
CUFF TRNQT CYL 34X4.125X (TOURNIQUET CUFF) IMPLANT
DRAPE C-ARM 42X72 X-RAY (DRAPES) IMPLANT
DRAPE C-ARMOR (DRAPES) ×2 IMPLANT
DRAPE U-SHAPE 47X51 STRL (DRAPES) ×2 IMPLANT
DRSG ADAPTIC 3X8 NADH LF (GAUZE/BANDAGES/DRESSINGS) ×2 IMPLANT
ELECT REM PT RETURN 9FT ADLT (ELECTROSURGICAL) ×1
ELECTRODE REM PT RTRN 9FT ADLT (ELECTROSURGICAL) ×2 IMPLANT
GAUZE SPONGE 4X4 12PLY STRL (GAUZE/BANDAGES/DRESSINGS) ×2 IMPLANT
GLOVE BIO SURGEON STRL SZ 6.5 (GLOVE) ×6 IMPLANT
GLOVE BIO SURGEON STRL SZ7.5 (GLOVE) ×8 IMPLANT
GLOVE BIOGEL PI IND STRL 6.5 (GLOVE) ×2 IMPLANT
GLOVE BIOGEL PI IND STRL 7.5 (GLOVE) ×2 IMPLANT
GOWN STRL REUS W/ TWL LRG LVL3 (GOWN DISPOSABLE) ×4 IMPLANT
GOWN STRL REUS W/TWL LRG LVL3 (GOWN DISPOSABLE) ×2
KIT BASIN OR (CUSTOM PROCEDURE TRAY) ×2 IMPLANT
KIT TURNOVER KIT B (KITS) ×2 IMPLANT
MANIFOLD NEPTUNE II (INSTRUMENTS) ×2 IMPLANT
NDL 22X1.5 STRL (OR ONLY) (MISCELLANEOUS) IMPLANT
NEEDLE 22X1.5 STRL (OR ONLY) (MISCELLANEOUS) IMPLANT
NS IRRIG 1000ML POUR BTL (IV SOLUTION) ×2 IMPLANT
PACK ORTHO EXTREMITY (CUSTOM PROCEDURE TRAY) ×2 IMPLANT
PAD ARMBOARD 7.5X6 YLW CONV (MISCELLANEOUS) ×4 IMPLANT
PAD CAST 3X4 CTTN HI CHSV (CAST SUPPLIES) IMPLANT
PADDING CAST COTTON 3X4 STRL (CAST SUPPLIES) ×1
PADDING CAST COTTON 6X4 STRL (CAST SUPPLIES) ×6 IMPLANT
SPONGE T-LAP 18X18 ~~LOC~~+RFID (SPONGE) ×2 IMPLANT
STAPLER VISISTAT 35W (STAPLE) IMPLANT
STOCKINETTE IMPERVIOUS LG (DRAPES) ×2 IMPLANT
STRIP CLOSURE SKIN 1/2X4 (GAUZE/BANDAGES/DRESSINGS) IMPLANT
SUCTION FRAZIER HANDLE 10FR (MISCELLANEOUS)
SUCTION TUBE FRAZIER 10FR DISP (MISCELLANEOUS) IMPLANT
SUT ETHILON 3 0 PS 1 (SUTURE) IMPLANT
SUT MNCRL AB 3-0 PS2 18 (SUTURE) ×2 IMPLANT
SUT MON AB 2-0 CT1 36 (SUTURE) ×2 IMPLANT
SUT PDS AB 2-0 CT1 27 (SUTURE) IMPLANT
SUT VIC AB 0 CT1 27 (SUTURE)
SUT VIC AB 0 CT1 27XBRD ANBCTR (SUTURE) IMPLANT
SUT VIC AB 2-0 CT1 27 (SUTURE)
SUT VIC AB 2-0 CT1 TAPERPNT 27 (SUTURE) IMPLANT
SYR CONTROL 10ML LL (SYRINGE) IMPLANT
TOWEL GREEN STERILE (TOWEL DISPOSABLE) ×4 IMPLANT
TOWEL GREEN STERILE FF (TOWEL DISPOSABLE) ×4 IMPLANT
TUBE CONNECTING 12X1/4 (SUCTIONS) ×2 IMPLANT
UNDERPAD 30X36 HEAVY ABSORB (UNDERPADS AND DIAPERS) ×2 IMPLANT
WATER STERILE IRR 1000ML POUR (IV SOLUTION) ×4 IMPLANT
YANKAUER SUCT BULB TIP NO VENT (SUCTIONS) ×2 IMPLANT

## 2022-08-11 NOTE — Anesthesia Preprocedure Evaluation (Addendum)
Anesthesia Evaluation  Patient identified by MRN, date of birth, ID band Patient awake    Reviewed: Allergy & Precautions, NPO status , Patient's Chart, lab work & pertinent test results  Airway Mallampati: III  TM Distance: >3 FB Neck ROM: Full  Mouth opening: Limited Mouth Opening  Dental  (+) Chipped, Dental Advisory Given   Pulmonary neg pulmonary ROS   Pulmonary exam normal breath sounds clear to auscultation       Cardiovascular negative cardio ROS Normal cardiovascular exam Rhythm:Regular Rate:Normal     Neuro/Psych  PSYCHIATRIC DISORDERS Anxiety     negative neurological ROS     GI/Hepatic negative GI ROS, Neg liver ROS,,,  Endo/Other    Morbid obesity (BMI 42)  Renal/GU negative Renal ROS  negative genitourinary   Musculoskeletal negative musculoskeletal ROS (+)    Abdominal   Peds  Hematology   Anesthesia Other Findings   Reproductive/Obstetrics                             Anesthesia Physical Anesthesia Plan  ASA: 2  Anesthesia Plan: General   Post-op Pain Management: Tylenol PO (pre-op)*   Induction: Intravenous  PONV Risk Score and Plan: 3 and Ondansetron, Dexamethasone and Treatment may vary due to age or medical condition  Airway Management Planned: LMA  Additional Equipment:   Intra-op Plan:   Post-operative Plan: Extubation in OR  Informed Consent: I have reviewed the patients History and Physical, chart, labs and discussed the procedure including the risks, benefits and alternatives for the proposed anesthesia with the patient or authorized representative who has indicated his/her understanding and acceptance.     Dental advisory given  Plan Discussed with: CRNA  Anesthesia Plan Comments:        Anesthesia Quick Evaluation

## 2022-08-11 NOTE — Interval H&P Note (Signed)
History and Physical Interval Note:  08/11/2022 7:17 AM  Megan Walter  has presented today for surgery, with the diagnosis of Left wrist hardware failure.  The various methods of treatment have been discussed with the patient and family. After consideration of risks, benefits and other options for treatment, the patient has consented to  Procedure(s): HARDWARE REMOVAL WRIST (Left) as a surgical intervention.  The patient's history has been reviewed, patient examined, no change in status, stable for surgery.  I have reviewed the patient's chart and labs.  Questions were answered to the patient's satisfaction.     Caryn Bee P Manasseh Pittsley

## 2022-08-11 NOTE — Anesthesia Procedure Notes (Signed)
Procedure Name: LMA Insertion Date/Time: 08/11/2022 7:39 AM  Performed by: Adria Dill, CRNAPre-anesthesia Checklist: Patient identified, Emergency Drugs available, Suction available and Patient being monitored Patient Re-evaluated:Patient Re-evaluated prior to induction Oxygen Delivery Method: Circle system utilized Preoxygenation: Pre-oxygenation with 100% oxygen Induction Type: IV induction Ventilation: Mask ventilation without difficulty LMA: LMA inserted LMA Size: 4.0 Number of attempts: 1 Placement Confirmation: positive ETCO2 and breath sounds checked- equal and bilateral Tube secured with: Tape Dental Injury: Teeth and Oropharynx as per pre-operative assessment

## 2022-08-11 NOTE — Discharge Instructions (Addendum)
[facility] Discharge Instructions   General Discharge Instructions  WEIGHT BEARING STATUS: Weightbearing as tolerated  RANGE OF MOTION/ACTIVITY: ok for gentle wrist range of motion  Wound Care: You may remove your surgical dressing on post-op day #2, (Sunday 08/13/22). Incisions can be left open to air if there is no drainage. Once the incision is completely dry and without drainage, it may be left open to air out.  Showering may begin post-op day #3, (Monday 08/14/22).  Clean incision gently with soap and water.  DVT/PE prophylaxis: Continue your Aspirin 325 mg daily until 08/18/22 the no additional prophylaxis needed  Diet: as you were eating previously.  Can use over the counter stool softeners and bowel preparations, such as Miralax, to help with bowel movements.  Narcotics can be constipating.  Be sure to drink plenty of fluids  PAIN MEDICATION USE AND EXPECTATIONS  You have likely been given narcotic medications to help control your pain.  After a traumatic event that results in an fracture (broken bone) with or without surgery, it is ok to use narcotic pain medications to help control one's pain.  We understand that everyone responds to pain differently and each individual patient will be evaluated on a regular basis for the continued need for narcotic medications. Ideally, narcotic medication use should last no more than 6-8 weeks (coinciding with fracture healing).   As a patient it is your responsibility as well to monitor narcotic medication use and report the amount and frequency you use these medications when you come to your office visit.   We would also advise that if you are using narcotic medications, you should take a dose prior to therapy to maximize you participation.  IF YOU ARE ON NARCOTIC MEDICATIONS IT IS NOT PERMISSIBLE TO OPERATE A MOTOR VEHICLE (MOTORCYCLE/CAR/TRUCK/MOPED) OR HEAVY MACHINERY DO NOT MIX NARCOTICS WITH OTHER CNS (CENTRAL NERVOUS SYSTEM)  DEPRESSANTS SUCH AS ALCOHOL   STOP SMOKING OR USING NICOTINE PRODUCTS!!!!  As discussed nicotine severely impairs your body's ability to heal surgical and traumatic wounds but also impairs bone healing.  Wounds and bone heal by forming microscopic blood vessels (angiogenesis) and nicotine is a vasoconstrictor (essentially, shrinks blood vessels).  Therefore, if vasoconstriction occurs to these microscopic blood vessels they essentially disappear and are unable to deliver necessary nutrients to the healing tissue.  This is one modifiable factor that you can do to dramatically increase your chances of healing your injury.    (This means no smoking, no nicotine gum, patches, etc)  DO NOT USE NONSTEROIDAL ANTI-INFLAMMATORY DRUGS (NSAID'S)  Using products such as Advil (ibuprofen), Aleve (naproxen), Motrin (ibuprofen) for additional pain control during fracture healing can delay and/or prevent the healing response.  If you would like to take over the counter (OTC) medication, Tylenol (acetaminophen) is ok.  However, some narcotic medications that are given for pain control contain acetaminophen as well. Therefore, you should not exceed more than 4000 mg of tylenol in a day if you do not have liver disease.  Also note that there are may OTC medicines, such as cold medicines and allergy medicines that my contain tylenol as well.  If you have any questions about medications and/or interactions please ask your doctor/PA or your pharmacist.      ICE AND ELEVATE INJURED/OPERATIVE EXTREMITY  Using ice and elevating the injured extremity above your heart can help with swelling and pain control.  Icing in a pulsatile fashion, such as 20 minutes on and 20 minutes off, can be  followed.    Do not place ice directly on skin. Make sure there is a barrier between to skin and the ice pack.    Using frozen items such as frozen peas works well as the conform nicely to the are that needs to be iced.  USE AN ACE WRAP OR TED  HOSE FOR SWELLING CONTROL  In addition to icing and elevation, Ace wraps or TED hose are used to help limit and resolve swelling.  It is recommended to use Ace wraps or TED hose until you are informed to stop.    When using Ace Wraps start the wrapping distally (farthest away from the body) and wrap proximally (closer to the body)   Example: If you had surgery on your leg or thing and you do not have a splint on, start the ace wrap at the toes and work your way up to the thigh        If you had surgery on your upper extremity and do not have a splint on, start the ace wrap at your fingers and work your way up to the upper arm  CALL THE OFFICE WITH ANY QUESTIONS OR CONCERNS: [phone]   VISIT OUR WEBSITE FOR ADDITIONAL INFORMATION: [email or web address]     Discharge Wound Care Instructions  Do NOT apply any ointments, solutions or lotions to pin sites or surgical wounds.  These prevent needed drainage and even though solutions like hydrogen peroxide kill bacteria, they also damage cells lining the pin sites that help fight infection.  Applying lotions or ointments can keep the wounds moist and can cause them to breakdown and open up as well. This can increase the risk for infection. When in doubt call the office.  Surgical incisions should be dressed daily.  If any drainage is noted, use one layer of adaptic or Mepitel, then gauze, Kerlix, and an ace wrap. - These dressing supplies should be available at local medical supply stores [facility], [facility], etc) as well as Insurance claims handler ([facility], [facility], [place], etc)  Once the incision is completely dry and without drainage, it may be left open to air out.  Showering may begin 36-48 hours later.  Cleaning gently with soap and water.  Traumatic wounds should be dressed daily as well.    One layer of adaptic, gauze, Kerlix, then ace wrap.  The adaptic can be discontinued once the draining has ceased    If you have a wet to dry  dressing: wet the gauze with saline the squeeze as much saline out so the gauze is moist (not soaking wet), place moistened gauze over wound, then place a dry gauze over the moist one, followed by Kerlix wrap, then ace wrap.

## 2022-08-11 NOTE — Transfer of Care (Signed)
Immediate Anesthesia Transfer of Care Note  Patient: Megan Walter  Procedure(s) Performed: HARDWARE REMOVAL LEFT WRIST (Left: Wrist)  Patient Location: PACU  Anesthesia Type:General  Level of Consciousness: awake and patient cooperative  Airway & Oxygen Therapy: Patient Spontanous Breathing  Post-op Assessment: Report given to RN and Post -op Vital signs reviewed and stable  Post vital signs: Reviewed and stable  Last Vitals:  Vitals Value Taken Time  BP 151/77 08/11/22 0830  Temp 36.6 C 08/11/22 0830  Pulse 107 08/11/22 0831  Resp 18 08/11/22 0831  SpO2 93 % 08/11/22 0831  Vitals shown include unvalidated device data.  Last Pain:  Vitals:   08/11/22 0635  TempSrc:   PainSc: 0-No pain         Complications: No notable events documented.

## 2022-08-11 NOTE — Op Note (Signed)
Orthopaedic Surgery Operative Note (CSN: [id number] ) Date of Surgery: 08/11/2022  Admit Date: 08/11/2022   Diagnoses: Pre-Op Diagnoses: Left healed distal radius fracture Left wrist hardware failure  Post-Op Diagnosis: Same  Procedures: CPT 26080-Removal of hardware left wrist  Surgeons : Primary: Megan Lofts, MD  Assistant: Megan Southward, PA-C  Location: OR 3   Anesthesia:general    Antibiotics: Ancef 2g preop with 1 gm vancomycin powder placed topically   Tourniquet time: None used    Estimated Blood Loss: 25 mL  Complications:None   Specimens:* No specimens in log *   Implants: Implant Name Type Inv. Item Serial No. Manufacturer Lot No. LRB No. Used Action  SCREW SELF TAP 61M - [id number] Screw SCREW SELF TAP 61M  [employer]  Left 3 Explanted  DISTAL RADIUS HEAD 6H 3HOLE SH - [id number] Orthopedic Implant DISTAL RADIUS HEAD 6H 3HOLE SH  [employer]  Left 1 Explanted  SCREW LOCKING 2.4X20 - [id number] Screw SCREW LOCKING 2.4X20  [employer]  Left 2 Explanted  SCREW LOCKING 2.4X22 - [id number] Screw SCREW LOCKING 2.4X22  [employer]  Left 1 Explanted  SCREW VA LOCKING 2.4X18 - [id number] Screw SCREW VA LOCKING 2.4X18  [employer]  Left 1 Explanted     Indications for Surgery: 77 year old female who sustained a left tibial plateau fracture and left distal radius fracture.  She underwent open reduction internal fixation of each.  Unfortunately she had settling of her distal radius fracture with cut out of her screws into the radiocarpal joint.  Due to the intra-articular nature of her screws I recommended proceeding with hardware removal.  CT scan was obtained to show that the fracture was nearly completely healed.  Risk and benefits were discussed with the patient and her daughter.  Risks included but not limited to bleeding, infection, persistent malunion, arthritic changes, nerve or blood vessel injury, even the possibility of  anesthetic complications.  They agreed to proceed with surgery and consent was obtained.  Operative Findings: Successful removal of distal radius plate fixation.  Procedure: The patient was identified in the preoperative holding area. Consent was confirmed with the patient and their family and all questions were answered. The operative extremity was marked after confirmation with the patient. she was then brought back to the operating room by our anesthesia colleagues.  She was carefully transferred over to radiolucent flattop table.  She was placed under general anesthetic.  The left upper extremity was then prepped and draped in usual sterile fashion.  A timeout was performed to verify the patient, the procedure, and the extremity.  Preoperative antibiotics were dosed.  Fluoroscopic imaging showed the failed fixation of the hardware.  A incision was made through the previous scar of her FCR approach to the distal radius.  I carried this down through skin and subcutaneous tissue.  Exposed the FCR tendon mobilized this out of the way and expose the volar radius and the plate that was in place.  I was then able to remove all of the distal screws and then remove the screws from the radial shaft.  I was able to unsuccessfully to remove the plate without difficulty.  Fluoroscopic imaging showed all removal of the hardware.  The incision was copiously irrigated.  A gram of vancomycin powder was placed to the incision.  A layered closure of 2-0 Vicryl 3-0 Monocryl and Steri-Strips were used to close the skin.  Sterile dressing was applied.  The patient was then awoke from anesthesia and taken to the  PACU in stable condition.  Post Op Plan/Instructions: Patient will be weightbearing as tolerated to the left upper extremity.  She will discharge home from the PACU.  Will continue with aspirin DVT prophylaxis for the lower extremity injury.  Unrestricted range of motion of the left upper extremity.  I was  present and performed the entire surgery.  Megan Southward, PA-C did assist me throughout the case. An assistant was necessary given the difficulty in approach, maintenance of reduction and ability to instrument the fracture.   Megan Merle, MD [facility]

## 2022-08-14 ENCOUNTER — Encounter (HOSPITAL_COMMUNITY): Payer: Self-pay | Admitting: Student

## 2022-08-15 DIAGNOSIS — I890 Lymphedema, not elsewhere classified: Secondary | ICD-10-CM | POA: Diagnosis not present

## 2022-08-15 DIAGNOSIS — M810 Age-related osteoporosis without current pathological fracture: Secondary | ICD-10-CM | POA: Diagnosis not present

## 2022-08-15 DIAGNOSIS — S82142D Displaced bicondylar fracture of left tibia, subsequent encounter for closed fracture with routine healing: Secondary | ICD-10-CM | POA: Diagnosis not present

## 2022-08-15 DIAGNOSIS — I509 Heart failure, unspecified: Secondary | ICD-10-CM | POA: Diagnosis not present

## 2022-08-15 DIAGNOSIS — S52615D Nondisplaced fracture of left ulna styloid process, subsequent encounter for closed fracture with routine healing: Secondary | ICD-10-CM | POA: Diagnosis not present

## 2022-08-15 DIAGNOSIS — Z7901 Long term (current) use of anticoagulants: Secondary | ICD-10-CM | POA: Diagnosis not present

## 2022-08-15 DIAGNOSIS — D62 Acute posthemorrhagic anemia: Secondary | ICD-10-CM | POA: Diagnosis not present

## 2022-08-15 DIAGNOSIS — Z9181 History of falling: Secondary | ICD-10-CM | POA: Diagnosis not present

## 2022-08-15 DIAGNOSIS — S82832D Other fracture of upper and lower end of left fibula, subsequent encounter for closed fracture with routine healing: Secondary | ICD-10-CM | POA: Diagnosis not present

## 2022-08-15 DIAGNOSIS — S52502D Unspecified fracture of the lower end of left radius, subsequent encounter for closed fracture with routine healing: Secondary | ICD-10-CM | POA: Diagnosis not present

## 2022-08-15 DIAGNOSIS — Z6839 Body mass index (BMI) 39.0-39.9, adult: Secondary | ICD-10-CM | POA: Diagnosis not present

## 2022-08-15 DIAGNOSIS — Z993 Dependence on wheelchair: Secondary | ICD-10-CM | POA: Diagnosis not present

## 2022-08-16 NOTE — Anesthesia Postprocedure Evaluation (Signed)
Anesthesia Post Note  Patient: Megan Walter  Procedure(s) Performed: HARDWARE REMOVAL LEFT WRIST (Left: Wrist)     Patient location during evaluation: PACU Anesthesia Type: General Level of consciousness: awake and alert Pain management: pain level controlled Vital Signs Assessment: post-procedure vital signs reviewed and stable Respiratory status: spontaneous breathing, nonlabored ventilation, respiratory function stable and patient connected to nasal cannula oxygen Cardiovascular status: blood pressure returned to baseline and stable Postop Assessment: no apparent nausea or vomiting Anesthetic complications: no  No notable events documented.  Last Vitals:  Vitals:   08/11/22 0855 08/11/22 0900  BP: 131/81 131/81  Pulse: 89 89  Resp: 18 19  Temp:  (!) 36.2 C  SpO2: 94% 94%    Last Pain:  Vitals:   08/11/22 0900  TempSrc:   PainSc: 0-No pain                 [name] L [name]

## 2022-08-22 DIAGNOSIS — S82832D Other fracture of upper and lower end of left fibula, subsequent encounter for closed fracture with routine healing: Secondary | ICD-10-CM | POA: Diagnosis not present

## 2022-08-22 DIAGNOSIS — Z993 Dependence on wheelchair: Secondary | ICD-10-CM | POA: Diagnosis not present

## 2022-08-22 DIAGNOSIS — I890 Lymphedema, not elsewhere classified: Secondary | ICD-10-CM | POA: Diagnosis not present

## 2022-08-22 DIAGNOSIS — M810 Age-related osteoporosis without current pathological fracture: Secondary | ICD-10-CM | POA: Diagnosis not present

## 2022-08-22 DIAGNOSIS — I509 Heart failure, unspecified: Secondary | ICD-10-CM | POA: Diagnosis not present

## 2022-08-22 DIAGNOSIS — S82142D Displaced bicondylar fracture of left tibia, subsequent encounter for closed fracture with routine healing: Secondary | ICD-10-CM | POA: Diagnosis not present

## 2022-08-22 DIAGNOSIS — Z9181 History of falling: Secondary | ICD-10-CM | POA: Diagnosis not present

## 2022-08-22 DIAGNOSIS — S52615D Nondisplaced fracture of left ulna styloid process, subsequent encounter for closed fracture with routine healing: Secondary | ICD-10-CM | POA: Diagnosis not present

## 2022-08-22 DIAGNOSIS — D62 Acute posthemorrhagic anemia: Secondary | ICD-10-CM | POA: Diagnosis not present

## 2022-08-22 DIAGNOSIS — Z6839 Body mass index (BMI) 39.0-39.9, adult: Secondary | ICD-10-CM | POA: Diagnosis not present

## 2022-08-22 DIAGNOSIS — Z7901 Long term (current) use of anticoagulants: Secondary | ICD-10-CM | POA: Diagnosis not present

## 2022-08-22 DIAGNOSIS — S52502D Unspecified fracture of the lower end of left radius, subsequent encounter for closed fracture with routine healing: Secondary | ICD-10-CM | POA: Diagnosis not present

## 2022-08-24 DIAGNOSIS — Z7901 Long term (current) use of anticoagulants: Secondary | ICD-10-CM | POA: Diagnosis not present

## 2022-08-24 DIAGNOSIS — Z993 Dependence on wheelchair: Secondary | ICD-10-CM | POA: Diagnosis not present

## 2022-08-24 DIAGNOSIS — M810 Age-related osteoporosis without current pathological fracture: Secondary | ICD-10-CM | POA: Diagnosis not present

## 2022-08-24 DIAGNOSIS — S52502D Unspecified fracture of the lower end of left radius, subsequent encounter for closed fracture with routine healing: Secondary | ICD-10-CM | POA: Diagnosis not present

## 2022-08-24 DIAGNOSIS — S82832D Other fracture of upper and lower end of left fibula, subsequent encounter for closed fracture with routine healing: Secondary | ICD-10-CM | POA: Diagnosis not present

## 2022-08-24 DIAGNOSIS — I509 Heart failure, unspecified: Secondary | ICD-10-CM | POA: Diagnosis not present

## 2022-08-24 DIAGNOSIS — S82142D Displaced bicondylar fracture of left tibia, subsequent encounter for closed fracture with routine healing: Secondary | ICD-10-CM | POA: Diagnosis not present

## 2022-08-24 DIAGNOSIS — Z6839 Body mass index (BMI) 39.0-39.9, adult: Secondary | ICD-10-CM | POA: Diagnosis not present

## 2022-08-24 DIAGNOSIS — I890 Lymphedema, not elsewhere classified: Secondary | ICD-10-CM | POA: Diagnosis not present

## 2022-08-24 DIAGNOSIS — Z9181 History of falling: Secondary | ICD-10-CM | POA: Diagnosis not present

## 2022-08-24 DIAGNOSIS — D62 Acute posthemorrhagic anemia: Secondary | ICD-10-CM | POA: Diagnosis not present

## 2022-08-24 DIAGNOSIS — S52615D Nondisplaced fracture of left ulna styloid process, subsequent encounter for closed fracture with routine healing: Secondary | ICD-10-CM | POA: Diagnosis not present

## 2022-08-28 DIAGNOSIS — Z993 Dependence on wheelchair: Secondary | ICD-10-CM | POA: Diagnosis not present

## 2022-08-28 DIAGNOSIS — D62 Acute posthemorrhagic anemia: Secondary | ICD-10-CM | POA: Diagnosis not present

## 2022-08-28 DIAGNOSIS — Z7901 Long term (current) use of anticoagulants: Secondary | ICD-10-CM | POA: Diagnosis not present

## 2022-08-28 DIAGNOSIS — Z6839 Body mass index (BMI) 39.0-39.9, adult: Secondary | ICD-10-CM | POA: Diagnosis not present

## 2022-08-28 DIAGNOSIS — I890 Lymphedema, not elsewhere classified: Secondary | ICD-10-CM | POA: Diagnosis not present

## 2022-08-28 DIAGNOSIS — S52615D Nondisplaced fracture of left ulna styloid process, subsequent encounter for closed fracture with routine healing: Secondary | ICD-10-CM | POA: Diagnosis not present

## 2022-08-28 DIAGNOSIS — I509 Heart failure, unspecified: Secondary | ICD-10-CM | POA: Diagnosis not present

## 2022-08-28 DIAGNOSIS — S82832D Other fracture of upper and lower end of left fibula, subsequent encounter for closed fracture with routine healing: Secondary | ICD-10-CM | POA: Diagnosis not present

## 2022-08-28 DIAGNOSIS — M810 Age-related osteoporosis without current pathological fracture: Secondary | ICD-10-CM | POA: Diagnosis not present

## 2022-08-28 DIAGNOSIS — Z9181 History of falling: Secondary | ICD-10-CM | POA: Diagnosis not present

## 2022-08-28 DIAGNOSIS — S52502D Unspecified fracture of the lower end of left radius, subsequent encounter for closed fracture with routine healing: Secondary | ICD-10-CM | POA: Diagnosis not present

## 2022-08-28 DIAGNOSIS — S82142D Displaced bicondylar fracture of left tibia, subsequent encounter for closed fracture with routine healing: Secondary | ICD-10-CM | POA: Diagnosis not present

## 2022-08-29 DIAGNOSIS — S62102A Fracture of unspecified carpal bone, left wrist, initial encounter for closed fracture: Secondary | ICD-10-CM | POA: Diagnosis not present

## 2022-08-29 DIAGNOSIS — S76102A Unspecified injury of left quadriceps muscle, fascia and tendon, initial encounter: Secondary | ICD-10-CM | POA: Diagnosis not present

## 2022-08-29 DIAGNOSIS — M810 Age-related osteoporosis without current pathological fracture: Secondary | ICD-10-CM | POA: Diagnosis not present

## 2022-08-29 DIAGNOSIS — S82202A Unspecified fracture of shaft of left tibia, initial encounter for closed fracture: Secondary | ICD-10-CM | POA: Diagnosis not present

## 2022-08-31 DIAGNOSIS — S82832D Other fracture of upper and lower end of left fibula, subsequent encounter for closed fracture with routine healing: Secondary | ICD-10-CM | POA: Diagnosis not present

## 2022-08-31 DIAGNOSIS — S52615D Nondisplaced fracture of left ulna styloid process, subsequent encounter for closed fracture with routine healing: Secondary | ICD-10-CM | POA: Diagnosis not present

## 2022-08-31 DIAGNOSIS — M810 Age-related osteoporosis without current pathological fracture: Secondary | ICD-10-CM | POA: Diagnosis not present

## 2022-08-31 DIAGNOSIS — S52502D Unspecified fracture of the lower end of left radius, subsequent encounter for closed fracture with routine healing: Secondary | ICD-10-CM | POA: Diagnosis not present

## 2022-08-31 DIAGNOSIS — Z9181 History of falling: Secondary | ICD-10-CM | POA: Diagnosis not present

## 2022-08-31 DIAGNOSIS — I509 Heart failure, unspecified: Secondary | ICD-10-CM | POA: Diagnosis not present

## 2022-08-31 DIAGNOSIS — I890 Lymphedema, not elsewhere classified: Secondary | ICD-10-CM | POA: Diagnosis not present

## 2022-08-31 DIAGNOSIS — Z6839 Body mass index (BMI) 39.0-39.9, adult: Secondary | ICD-10-CM | POA: Diagnosis not present

## 2022-08-31 DIAGNOSIS — Z993 Dependence on wheelchair: Secondary | ICD-10-CM | POA: Diagnosis not present

## 2022-08-31 DIAGNOSIS — S82142D Displaced bicondylar fracture of left tibia, subsequent encounter for closed fracture with routine healing: Secondary | ICD-10-CM | POA: Diagnosis not present

## 2022-08-31 DIAGNOSIS — Z7901 Long term (current) use of anticoagulants: Secondary | ICD-10-CM | POA: Diagnosis not present

## 2022-08-31 DIAGNOSIS — D62 Acute posthemorrhagic anemia: Secondary | ICD-10-CM | POA: Diagnosis not present

## 2022-09-03 DIAGNOSIS — S52502D Unspecified fracture of the lower end of left radius, subsequent encounter for closed fracture with routine healing: Secondary | ICD-10-CM | POA: Diagnosis not present

## 2022-09-03 DIAGNOSIS — S62102A Fracture of unspecified carpal bone, left wrist, initial encounter for closed fracture: Secondary | ICD-10-CM | POA: Diagnosis not present

## 2022-09-03 DIAGNOSIS — Z993 Dependence on wheelchair: Secondary | ICD-10-CM | POA: Diagnosis not present

## 2022-09-03 DIAGNOSIS — S82832D Other fracture of upper and lower end of left fibula, subsequent encounter for closed fracture with routine healing: Secondary | ICD-10-CM | POA: Diagnosis not present

## 2022-09-03 DIAGNOSIS — Z9181 History of falling: Secondary | ICD-10-CM | POA: Diagnosis not present

## 2022-09-03 DIAGNOSIS — M810 Age-related osteoporosis without current pathological fracture: Secondary | ICD-10-CM | POA: Diagnosis not present

## 2022-09-03 DIAGNOSIS — S82142D Displaced bicondylar fracture of left tibia, subsequent encounter for closed fracture with routine healing: Secondary | ICD-10-CM | POA: Diagnosis not present

## 2022-09-03 DIAGNOSIS — D62 Acute posthemorrhagic anemia: Secondary | ICD-10-CM | POA: Diagnosis not present

## 2022-09-03 DIAGNOSIS — Z6839 Body mass index (BMI) 39.0-39.9, adult: Secondary | ICD-10-CM | POA: Diagnosis not present

## 2022-09-03 DIAGNOSIS — S52615D Nondisplaced fracture of left ulna styloid process, subsequent encounter for closed fracture with routine healing: Secondary | ICD-10-CM | POA: Diagnosis not present

## 2022-09-03 DIAGNOSIS — Z7901 Long term (current) use of anticoagulants: Secondary | ICD-10-CM | POA: Diagnosis not present

## 2022-09-03 DIAGNOSIS — I509 Heart failure, unspecified: Secondary | ICD-10-CM | POA: Diagnosis not present

## 2022-09-03 DIAGNOSIS — I890 Lymphedema, not elsewhere classified: Secondary | ICD-10-CM | POA: Diagnosis not present

## 2022-09-04 DIAGNOSIS — S62102A Fracture of unspecified carpal bone, left wrist, initial encounter for closed fracture: Secondary | ICD-10-CM | POA: Diagnosis not present

## 2022-09-04 DIAGNOSIS — S82832D Other fracture of upper and lower end of left fibula, subsequent encounter for closed fracture with routine healing: Secondary | ICD-10-CM | POA: Diagnosis not present

## 2022-09-04 DIAGNOSIS — Z6839 Body mass index (BMI) 39.0-39.9, adult: Secondary | ICD-10-CM | POA: Diagnosis not present

## 2022-09-04 DIAGNOSIS — Z7901 Long term (current) use of anticoagulants: Secondary | ICD-10-CM | POA: Diagnosis not present

## 2022-09-04 DIAGNOSIS — S82142D Displaced bicondylar fracture of left tibia, subsequent encounter for closed fracture with routine healing: Secondary | ICD-10-CM | POA: Diagnosis not present

## 2022-09-04 DIAGNOSIS — S52615D Nondisplaced fracture of left ulna styloid process, subsequent encounter for closed fracture with routine healing: Secondary | ICD-10-CM | POA: Diagnosis not present

## 2022-09-04 DIAGNOSIS — I890 Lymphedema, not elsewhere classified: Secondary | ICD-10-CM | POA: Diagnosis not present

## 2022-09-04 DIAGNOSIS — Z9181 History of falling: Secondary | ICD-10-CM | POA: Diagnosis not present

## 2022-09-04 DIAGNOSIS — Z993 Dependence on wheelchair: Secondary | ICD-10-CM | POA: Diagnosis not present

## 2022-09-04 DIAGNOSIS — I509 Heart failure, unspecified: Secondary | ICD-10-CM | POA: Diagnosis not present

## 2022-09-04 DIAGNOSIS — S52502D Unspecified fracture of the lower end of left radius, subsequent encounter for closed fracture with routine healing: Secondary | ICD-10-CM | POA: Diagnosis not present

## 2022-09-04 DIAGNOSIS — M810 Age-related osteoporosis without current pathological fracture: Secondary | ICD-10-CM | POA: Diagnosis not present

## 2022-09-04 DIAGNOSIS — D62 Acute posthemorrhagic anemia: Secondary | ICD-10-CM | POA: Diagnosis not present

## 2022-09-05 ENCOUNTER — Telehealth: Payer: Self-pay | Admitting: Family Medicine

## 2022-09-05 NOTE — Telephone Encounter (Signed)
Contacted Megan Walter to schedule their annual wellness visit. Patient declined to schedule AWV at this time.  *(Moody Bruins)*

## 2022-09-11 DIAGNOSIS — D62 Acute posthemorrhagic anemia: Secondary | ICD-10-CM | POA: Diagnosis not present

## 2022-09-11 DIAGNOSIS — S82142D Displaced bicondylar fracture of left tibia, subsequent encounter for closed fracture with routine healing: Secondary | ICD-10-CM | POA: Diagnosis not present

## 2022-09-11 DIAGNOSIS — Z993 Dependence on wheelchair: Secondary | ICD-10-CM | POA: Diagnosis not present

## 2022-09-11 DIAGNOSIS — S52502D Unspecified fracture of the lower end of left radius, subsequent encounter for closed fracture with routine healing: Secondary | ICD-10-CM | POA: Diagnosis not present

## 2022-09-11 DIAGNOSIS — S52615D Nondisplaced fracture of left ulna styloid process, subsequent encounter for closed fracture with routine healing: Secondary | ICD-10-CM | POA: Diagnosis not present

## 2022-09-11 DIAGNOSIS — Z7901 Long term (current) use of anticoagulants: Secondary | ICD-10-CM | POA: Diagnosis not present

## 2022-09-11 DIAGNOSIS — M810 Age-related osteoporosis without current pathological fracture: Secondary | ICD-10-CM | POA: Diagnosis not present

## 2022-09-11 DIAGNOSIS — Z9181 History of falling: Secondary | ICD-10-CM | POA: Diagnosis not present

## 2022-09-11 DIAGNOSIS — I890 Lymphedema, not elsewhere classified: Secondary | ICD-10-CM | POA: Diagnosis not present

## 2022-09-11 DIAGNOSIS — S82832D Other fracture of upper and lower end of left fibula, subsequent encounter for closed fracture with routine healing: Secondary | ICD-10-CM | POA: Diagnosis not present

## 2022-09-11 DIAGNOSIS — I509 Heart failure, unspecified: Secondary | ICD-10-CM | POA: Diagnosis not present

## 2022-09-11 DIAGNOSIS — Z6839 Body mass index (BMI) 39.0-39.9, adult: Secondary | ICD-10-CM | POA: Diagnosis not present

## 2022-09-11 DIAGNOSIS — S62102A Fracture of unspecified carpal bone, left wrist, initial encounter for closed fracture: Secondary | ICD-10-CM | POA: Diagnosis not present

## 2022-09-12 DIAGNOSIS — I509 Heart failure, unspecified: Secondary | ICD-10-CM | POA: Diagnosis not present

## 2022-09-12 DIAGNOSIS — Z993 Dependence on wheelchair: Secondary | ICD-10-CM | POA: Diagnosis not present

## 2022-09-12 DIAGNOSIS — Z7901 Long term (current) use of anticoagulants: Secondary | ICD-10-CM | POA: Diagnosis not present

## 2022-09-12 DIAGNOSIS — Z6839 Body mass index (BMI) 39.0-39.9, adult: Secondary | ICD-10-CM | POA: Diagnosis not present

## 2022-09-12 DIAGNOSIS — S52615D Nondisplaced fracture of left ulna styloid process, subsequent encounter for closed fracture with routine healing: Secondary | ICD-10-CM | POA: Diagnosis not present

## 2022-09-12 DIAGNOSIS — Z9181 History of falling: Secondary | ICD-10-CM | POA: Diagnosis not present

## 2022-09-12 DIAGNOSIS — I890 Lymphedema, not elsewhere classified: Secondary | ICD-10-CM | POA: Diagnosis not present

## 2022-09-12 DIAGNOSIS — M810 Age-related osteoporosis without current pathological fracture: Secondary | ICD-10-CM | POA: Diagnosis not present

## 2022-09-12 DIAGNOSIS — S62102A Fracture of unspecified carpal bone, left wrist, initial encounter for closed fracture: Secondary | ICD-10-CM | POA: Diagnosis not present

## 2022-09-12 DIAGNOSIS — S52502D Unspecified fracture of the lower end of left radius, subsequent encounter for closed fracture with routine healing: Secondary | ICD-10-CM | POA: Diagnosis not present

## 2022-09-12 DIAGNOSIS — S82832D Other fracture of upper and lower end of left fibula, subsequent encounter for closed fracture with routine healing: Secondary | ICD-10-CM | POA: Diagnosis not present

## 2022-09-12 DIAGNOSIS — S82142D Displaced bicondylar fracture of left tibia, subsequent encounter for closed fracture with routine healing: Secondary | ICD-10-CM | POA: Diagnosis not present

## 2022-09-12 DIAGNOSIS — D62 Acute posthemorrhagic anemia: Secondary | ICD-10-CM | POA: Diagnosis not present

## 2022-09-14 DIAGNOSIS — I890 Lymphedema, not elsewhere classified: Secondary | ICD-10-CM | POA: Diagnosis not present

## 2022-09-14 DIAGNOSIS — Z9181 History of falling: Secondary | ICD-10-CM | POA: Diagnosis not present

## 2022-09-14 DIAGNOSIS — Z6839 Body mass index (BMI) 39.0-39.9, adult: Secondary | ICD-10-CM | POA: Diagnosis not present

## 2022-09-14 DIAGNOSIS — D62 Acute posthemorrhagic anemia: Secondary | ICD-10-CM | POA: Diagnosis not present

## 2022-09-14 DIAGNOSIS — Z993 Dependence on wheelchair: Secondary | ICD-10-CM | POA: Diagnosis not present

## 2022-09-14 DIAGNOSIS — S62102A Fracture of unspecified carpal bone, left wrist, initial encounter for closed fracture: Secondary | ICD-10-CM | POA: Diagnosis not present

## 2022-09-14 DIAGNOSIS — Z7901 Long term (current) use of anticoagulants: Secondary | ICD-10-CM | POA: Diagnosis not present

## 2022-09-14 DIAGNOSIS — S52615D Nondisplaced fracture of left ulna styloid process, subsequent encounter for closed fracture with routine healing: Secondary | ICD-10-CM | POA: Diagnosis not present

## 2022-09-14 DIAGNOSIS — S52502D Unspecified fracture of the lower end of left radius, subsequent encounter for closed fracture with routine healing: Secondary | ICD-10-CM | POA: Diagnosis not present

## 2022-09-14 DIAGNOSIS — S82832D Other fracture of upper and lower end of left fibula, subsequent encounter for closed fracture with routine healing: Secondary | ICD-10-CM | POA: Diagnosis not present

## 2022-09-14 DIAGNOSIS — S82142D Displaced bicondylar fracture of left tibia, subsequent encounter for closed fracture with routine healing: Secondary | ICD-10-CM | POA: Diagnosis not present

## 2022-09-14 DIAGNOSIS — M810 Age-related osteoporosis without current pathological fracture: Secondary | ICD-10-CM | POA: Diagnosis not present

## 2022-09-14 DIAGNOSIS — I509 Heart failure, unspecified: Secondary | ICD-10-CM | POA: Diagnosis not present

## 2022-09-19 DIAGNOSIS — S82142D Displaced bicondylar fracture of left tibia, subsequent encounter for closed fracture with routine healing: Secondary | ICD-10-CM | POA: Diagnosis not present

## 2022-09-20 DIAGNOSIS — M810 Age-related osteoporosis without current pathological fracture: Secondary | ICD-10-CM | POA: Diagnosis not present

## 2022-09-20 DIAGNOSIS — Z6839 Body mass index (BMI) 39.0-39.9, adult: Secondary | ICD-10-CM | POA: Diagnosis not present

## 2022-09-20 DIAGNOSIS — S82832D Other fracture of upper and lower end of left fibula, subsequent encounter for closed fracture with routine healing: Secondary | ICD-10-CM | POA: Diagnosis not present

## 2022-09-20 DIAGNOSIS — Z993 Dependence on wheelchair: Secondary | ICD-10-CM | POA: Diagnosis not present

## 2022-09-20 DIAGNOSIS — I890 Lymphedema, not elsewhere classified: Secondary | ICD-10-CM | POA: Diagnosis not present

## 2022-09-20 DIAGNOSIS — Z7901 Long term (current) use of anticoagulants: Secondary | ICD-10-CM | POA: Diagnosis not present

## 2022-09-20 DIAGNOSIS — S62102A Fracture of unspecified carpal bone, left wrist, initial encounter for closed fracture: Secondary | ICD-10-CM | POA: Diagnosis not present

## 2022-09-20 DIAGNOSIS — S82142D Displaced bicondylar fracture of left tibia, subsequent encounter for closed fracture with routine healing: Secondary | ICD-10-CM | POA: Diagnosis not present

## 2022-09-20 DIAGNOSIS — D62 Acute posthemorrhagic anemia: Secondary | ICD-10-CM | POA: Diagnosis not present

## 2022-09-20 DIAGNOSIS — I509 Heart failure, unspecified: Secondary | ICD-10-CM | POA: Diagnosis not present

## 2022-09-20 DIAGNOSIS — Z9181 History of falling: Secondary | ICD-10-CM | POA: Diagnosis not present

## 2022-09-20 DIAGNOSIS — S52502D Unspecified fracture of the lower end of left radius, subsequent encounter for closed fracture with routine healing: Secondary | ICD-10-CM | POA: Diagnosis not present

## 2022-09-20 DIAGNOSIS — S52615D Nondisplaced fracture of left ulna styloid process, subsequent encounter for closed fracture with routine healing: Secondary | ICD-10-CM | POA: Diagnosis not present

## 2022-09-21 DIAGNOSIS — S52615D Nondisplaced fracture of left ulna styloid process, subsequent encounter for closed fracture with routine healing: Secondary | ICD-10-CM | POA: Diagnosis not present

## 2022-09-21 DIAGNOSIS — D62 Acute posthemorrhagic anemia: Secondary | ICD-10-CM | POA: Diagnosis not present

## 2022-09-21 DIAGNOSIS — S82142D Displaced bicondylar fracture of left tibia, subsequent encounter for closed fracture with routine healing: Secondary | ICD-10-CM | POA: Diagnosis not present

## 2022-09-21 DIAGNOSIS — S62102A Fracture of unspecified carpal bone, left wrist, initial encounter for closed fracture: Secondary | ICD-10-CM | POA: Diagnosis not present

## 2022-09-21 DIAGNOSIS — I890 Lymphedema, not elsewhere classified: Secondary | ICD-10-CM | POA: Diagnosis not present

## 2022-09-21 DIAGNOSIS — Z9181 History of falling: Secondary | ICD-10-CM | POA: Diagnosis not present

## 2022-09-21 DIAGNOSIS — Z7901 Long term (current) use of anticoagulants: Secondary | ICD-10-CM | POA: Diagnosis not present

## 2022-09-21 DIAGNOSIS — Z6839 Body mass index (BMI) 39.0-39.9, adult: Secondary | ICD-10-CM | POA: Diagnosis not present

## 2022-09-21 DIAGNOSIS — I509 Heart failure, unspecified: Secondary | ICD-10-CM | POA: Diagnosis not present

## 2022-09-21 DIAGNOSIS — S52502D Unspecified fracture of the lower end of left radius, subsequent encounter for closed fracture with routine healing: Secondary | ICD-10-CM | POA: Diagnosis not present

## 2022-09-21 DIAGNOSIS — Z993 Dependence on wheelchair: Secondary | ICD-10-CM | POA: Diagnosis not present

## 2022-09-21 DIAGNOSIS — M810 Age-related osteoporosis without current pathological fracture: Secondary | ICD-10-CM | POA: Diagnosis not present

## 2022-09-21 DIAGNOSIS — S82832D Other fracture of upper and lower end of left fibula, subsequent encounter for closed fracture with routine healing: Secondary | ICD-10-CM | POA: Diagnosis not present

## 2022-09-26 DIAGNOSIS — I509 Heart failure, unspecified: Secondary | ICD-10-CM | POA: Diagnosis not present

## 2022-09-26 DIAGNOSIS — Z7901 Long term (current) use of anticoagulants: Secondary | ICD-10-CM | POA: Diagnosis not present

## 2022-09-26 DIAGNOSIS — Z6839 Body mass index (BMI) 39.0-39.9, adult: Secondary | ICD-10-CM | POA: Diagnosis not present

## 2022-09-26 DIAGNOSIS — I890 Lymphedema, not elsewhere classified: Secondary | ICD-10-CM | POA: Diagnosis not present

## 2022-09-26 DIAGNOSIS — S82142D Displaced bicondylar fracture of left tibia, subsequent encounter for closed fracture with routine healing: Secondary | ICD-10-CM | POA: Diagnosis not present

## 2022-09-26 DIAGNOSIS — S82832D Other fracture of upper and lower end of left fibula, subsequent encounter for closed fracture with routine healing: Secondary | ICD-10-CM | POA: Diagnosis not present

## 2022-09-26 DIAGNOSIS — M810 Age-related osteoporosis without current pathological fracture: Secondary | ICD-10-CM | POA: Diagnosis not present

## 2022-09-26 DIAGNOSIS — S52502D Unspecified fracture of the lower end of left radius, subsequent encounter for closed fracture with routine healing: Secondary | ICD-10-CM | POA: Diagnosis not present

## 2022-09-26 DIAGNOSIS — Z9181 History of falling: Secondary | ICD-10-CM | POA: Diagnosis not present

## 2022-09-26 DIAGNOSIS — Z993 Dependence on wheelchair: Secondary | ICD-10-CM | POA: Diagnosis not present

## 2022-09-26 DIAGNOSIS — S62102A Fracture of unspecified carpal bone, left wrist, initial encounter for closed fracture: Secondary | ICD-10-CM | POA: Diagnosis not present

## 2022-09-26 DIAGNOSIS — S52615D Nondisplaced fracture of left ulna styloid process, subsequent encounter for closed fracture with routine healing: Secondary | ICD-10-CM | POA: Diagnosis not present

## 2022-09-26 DIAGNOSIS — D62 Acute posthemorrhagic anemia: Secondary | ICD-10-CM | POA: Diagnosis not present

## 2022-09-29 DIAGNOSIS — M810 Age-related osteoporosis without current pathological fracture: Secondary | ICD-10-CM | POA: Diagnosis not present

## 2022-09-29 DIAGNOSIS — S76102A Unspecified injury of left quadriceps muscle, fascia and tendon, initial encounter: Secondary | ICD-10-CM | POA: Diagnosis not present

## 2022-09-29 DIAGNOSIS — S82202A Unspecified fracture of shaft of left tibia, initial encounter for closed fracture: Secondary | ICD-10-CM | POA: Diagnosis not present

## 2022-09-29 DIAGNOSIS — S62102A Fracture of unspecified carpal bone, left wrist, initial encounter for closed fracture: Secondary | ICD-10-CM | POA: Diagnosis not present

## 2022-10-29 DIAGNOSIS — S62102A Fracture of unspecified carpal bone, left wrist, initial encounter for closed fracture: Secondary | ICD-10-CM | POA: Diagnosis not present

## 2022-10-29 DIAGNOSIS — S76102A Unspecified injury of left quadriceps muscle, fascia and tendon, initial encounter: Secondary | ICD-10-CM | POA: Diagnosis not present

## 2022-10-29 DIAGNOSIS — S82202A Unspecified fracture of shaft of left tibia, initial encounter for closed fracture: Secondary | ICD-10-CM | POA: Diagnosis not present

## 2022-10-29 DIAGNOSIS — M810 Age-related osteoporosis without current pathological fracture: Secondary | ICD-10-CM | POA: Diagnosis not present

## 2022-11-05 ENCOUNTER — Other Ambulatory Visit: Payer: Self-pay | Admitting: Family Medicine

## 2022-11-21 DIAGNOSIS — T8484XD Pain due to internal orthopedic prosthetic devices, implants and grafts, subsequent encounter: Secondary | ICD-10-CM | POA: Diagnosis not present

## 2022-11-21 DIAGNOSIS — M25561 Pain in right knee: Secondary | ICD-10-CM | POA: Diagnosis not present

## 2022-11-21 DIAGNOSIS — S82142D Displaced bicondylar fracture of left tibia, subsequent encounter for closed fracture with routine healing: Secondary | ICD-10-CM | POA: Diagnosis not present

## 2022-11-21 DIAGNOSIS — S52502D Unspecified fracture of the lower end of left radius, subsequent encounter for closed fracture with routine healing: Secondary | ICD-10-CM | POA: Diagnosis not present

## 2022-11-29 DIAGNOSIS — S62102A Fracture of unspecified carpal bone, left wrist, initial encounter for closed fracture: Secondary | ICD-10-CM | POA: Diagnosis not present

## 2022-11-29 DIAGNOSIS — S82202A Unspecified fracture of shaft of left tibia, initial encounter for closed fracture: Secondary | ICD-10-CM | POA: Diagnosis not present

## 2022-11-29 DIAGNOSIS — S76102A Unspecified injury of left quadriceps muscle, fascia and tendon, initial encounter: Secondary | ICD-10-CM | POA: Diagnosis not present

## 2022-11-29 DIAGNOSIS — M810 Age-related osteoporosis without current pathological fracture: Secondary | ICD-10-CM | POA: Diagnosis not present

## 2022-12-30 DIAGNOSIS — S76102A Unspecified injury of left quadriceps muscle, fascia and tendon, initial encounter: Secondary | ICD-10-CM | POA: Diagnosis not present

## 2022-12-30 DIAGNOSIS — M810 Age-related osteoporosis without current pathological fracture: Secondary | ICD-10-CM | POA: Diagnosis not present

## 2022-12-30 DIAGNOSIS — S82202A Unspecified fracture of shaft of left tibia, initial encounter for closed fracture: Secondary | ICD-10-CM | POA: Diagnosis not present

## 2022-12-30 DIAGNOSIS — S62102A Fracture of unspecified carpal bone, left wrist, initial encounter for closed fracture: Secondary | ICD-10-CM | POA: Diagnosis not present

## 2023-01-26 ENCOUNTER — Other Ambulatory Visit: Payer: Self-pay | Admitting: Family Medicine

## 2023-01-26 DIAGNOSIS — Z1212 Encounter for screening for malignant neoplasm of rectum: Secondary | ICD-10-CM

## 2023-01-26 DIAGNOSIS — Z1211 Encounter for screening for malignant neoplasm of colon: Secondary | ICD-10-CM

## 2023-01-29 DIAGNOSIS — S76102A Unspecified injury of left quadriceps muscle, fascia and tendon, initial encounter: Secondary | ICD-10-CM | POA: Diagnosis not present

## 2023-01-29 DIAGNOSIS — M810 Age-related osteoporosis without current pathological fracture: Secondary | ICD-10-CM | POA: Diagnosis not present

## 2023-01-29 DIAGNOSIS — S82202A Unspecified fracture of shaft of left tibia, initial encounter for closed fracture: Secondary | ICD-10-CM | POA: Diagnosis not present

## 2023-01-29 DIAGNOSIS — S62102A Fracture of unspecified carpal bone, left wrist, initial encounter for closed fracture: Secondary | ICD-10-CM | POA: Diagnosis not present

## 2023-03-01 DIAGNOSIS — M810 Age-related osteoporosis without current pathological fracture: Secondary | ICD-10-CM | POA: Diagnosis not present

## 2023-03-01 DIAGNOSIS — S62102A Fracture of unspecified carpal bone, left wrist, initial encounter for closed fracture: Secondary | ICD-10-CM | POA: Diagnosis not present

## 2023-03-01 DIAGNOSIS — S76102A Unspecified injury of left quadriceps muscle, fascia and tendon, initial encounter: Secondary | ICD-10-CM | POA: Diagnosis not present

## 2023-03-01 DIAGNOSIS — S82202A Unspecified fracture of shaft of left tibia, initial encounter for closed fracture: Secondary | ICD-10-CM | POA: Diagnosis not present

## 2023-03-28 ENCOUNTER — Ambulatory Visit (INDEPENDENT_AMBULATORY_CARE_PROVIDER_SITE_OTHER): Payer: Medicare Other | Admitting: Family Medicine

## 2023-03-28 VITALS — BP 160/93 | HR 94 | Ht 65.0 in | Wt 257.2 lb

## 2023-03-28 DIAGNOSIS — R051 Acute cough: Secondary | ICD-10-CM | POA: Insufficient documentation

## 2023-03-28 DIAGNOSIS — F411 Generalized anxiety disorder: Secondary | ICD-10-CM

## 2023-03-28 LAB — POCT INFLUENZA A/B
Influenza A, POC: NEGATIVE
Influenza B, POC: NEGATIVE

## 2023-03-28 LAB — POC COVID19 BINAXNOW: SARS Coronavirus 2 Ag: NEGATIVE

## 2023-03-28 MED ORDER — SERTRALINE HCL 25 MG PO TABS: 25.0000 mg | ORAL_TABLET | Freq: Every day | ORAL | 2 refills | Status: DC

## 2023-03-28 MED ORDER — HYDROCOD POLI-CHLORPHE POLI ER 10-8 MG/5ML PO SUER: 5.0000 mL | Freq: Two times a day (BID) | ORAL | 0 refills | Status: DC | PRN

## 2023-03-28 MED ORDER — ALBUTEROL SULFATE HFA 108 (90 BASE) MCG/ACT IN AERS: 2.0000 | INHALATION_SPRAY | Freq: Four times a day (QID) | RESPIRATORY_TRACT | 0 refills | Status: DC | PRN

## 2023-03-28 MED ORDER — SERTRALINE HCL 25 MG PO TABS: 25.0000 mg | ORAL_TABLET | Freq: Every day | ORAL | 3 refills | Status: DC

## 2023-03-28 MED ORDER — METHYLPREDNISOLONE 4 MG PO TBPK: ORAL_TABLET | ORAL | 0 refills | Status: DC

## 2023-03-28 NOTE — Progress Notes (Signed)
Acute Office Visit  Subjective:     Patient ID: Megan Walter, female    DOB: 1945-05-15, 77 y.o.   MRN: [id number]  Chief Complaint  Patient presents with   Cough    Pt states since Thanksgiving    HPI Patient is in today for sick visit. She has been experiencing a cough for one week that has caused some muscle soreness. Reports dry cough.   She also needs a refill on zoloft 25mg . Has been doing well.   Review of Systems  Constitutional:  Negative for chills and fever.  Respiratory:  Positive for cough. Negative for shortness of breath.   Cardiovascular:  Negative for chest pain.  Neurological:  Negative for headaches.        Objective:    BP (!) 160/93 (BP Location: Left Arm, Patient Position: Sitting, Cuff Size: Large)   Pulse 94   Ht 5\' 5"  (1.651 m)   Wt 257 lb 4 oz (116.7 kg)   SpO2 95%   BMI 42.81 kg/m    Physical Exam Vitals and nursing note reviewed.  Constitutional:      General: She is not in acute distress.    Appearance: Normal appearance.  HENT:     Head: Normocephalic and atraumatic.     Right Ear: External ear normal.     Left Ear: External ear normal.     Nose: Nose normal.  Eyes:     Conjunctiva/sclera: Conjunctivae normal.  Cardiovascular:     Rate and Rhythm: Normal rate and regular rhythm.  Pulmonary:     Effort: Pulmonary effort is normal.     Breath sounds: Normal breath sounds.  Neurological:     General: No focal deficit present.     Mental Status: She is alert and oriented to person, place, and time.  Psychiatric:        Mood and Affect: Mood normal.        Behavior: Behavior normal.        Thought Content: Thought content normal.        Judgment: Judgment normal.     Results for orders placed or performed in visit on 03/28/23  POCT Influenza A/B  Result Value Ref Range   Influenza A, POC Negative Negative   Influenza B, POC Negative Negative  POC COVID-19  Result Value Ref Range   SARS Coronavirus 2 Ag Negative  Negative        Assessment & Plan:   Problem List Items Addressed This Visit       Other   Generalized anxiety disorder    Will refill zoloft 25mg , pt doing well      Relevant Medications   sertraline (ZOLOFT) 25 MG tablet   Other Relevant Orders   POCT Influenza A/B (Completed)   POC COVID-19 (Completed)   Acute cough - Primary    Very pleasant 77yo female presents with dry cough for one week. Denies any congestion, body aches, fevers.  - on exam pt coughs and her cough is tight so I will go ahead and give medrol dose pack to help inflammation as well as an albuterol inhaler. Will also give tussionex, pmp reviewed and verified with no red flags - if no better or symptoms get worse by next week we can try an abx but at this time I would recommend supportive therapy.       Relevant Medications   chlorpheniramine-HYDROcodone (TUSSIONEX) 10-8 MG/5ML   albuterol (VENTOLIN HFA) 108 (90 Base) MCG/ACT inhaler  methylPREDNISolone (MEDROL DOSEPAK) 4 MG TBPK tablet   Other Relevant Orders   POCT Influenza A/B (Completed)   POC COVID-19 (Completed)    Meds ordered this encounter  Medications   DISCONTD: sertraline (ZOLOFT) 25 MG tablet    Sig: Take 1 tablet (25 mg total) by mouth daily.    Dispense:  30 tablet    Refill:  3   sertraline (ZOLOFT) 25 MG tablet    Sig: Take 1 tablet (25 mg total) by mouth daily.    Dispense:  90 tablet    Refill:  2   chlorpheniramine-HYDROcodone (TUSSIONEX) 10-8 MG/5ML    Sig: Take 5 mLs by mouth every 12 (twelve) hours as needed for cough (cough, will cause drowsiness.).    Dispense:  120 mL    Refill:  0   albuterol (VENTOLIN HFA) 108 (90 Base) MCG/ACT inhaler    Sig: Inhale 2 puffs into the lungs every 6 (six) hours as needed for wheezing or shortness of breath.    Dispense:  8 g    Refill:  0   methylPREDNISolone (MEDROL DOSEPAK) 4 MG TBPK tablet    Sig: Follow instructions on pill pack    Dispense:  21 tablet    Refill:  0     Return if symptoms worsen or fail to improve.  Charlton Amor, DO

## 2023-03-28 NOTE — Assessment & Plan Note (Signed)
Will refill zoloft 25mg , pt doing well

## 2023-03-28 NOTE — Assessment & Plan Note (Addendum)
Very pleasant 77yo female presents with dry cough for one week. Denies any congestion, body aches, fevers.  - on exam pt coughs and her cough is tight so I will go ahead and give medrol dose pack to help inflammation as well as an albuterol inhaler. Will also give tussionex, pmp reviewed and verified with no red flags - if no better or symptoms get worse by next week we can try an abx but at this time I would recommend supportive therapy.

## 2023-03-31 DIAGNOSIS — S82202A Unspecified fracture of shaft of left tibia, initial encounter for closed fracture: Secondary | ICD-10-CM | POA: Diagnosis not present

## 2023-03-31 DIAGNOSIS — S62102A Fracture of unspecified carpal bone, left wrist, initial encounter for closed fracture: Secondary | ICD-10-CM | POA: Diagnosis not present

## 2023-03-31 DIAGNOSIS — M810 Age-related osteoporosis without current pathological fracture: Secondary | ICD-10-CM | POA: Diagnosis not present

## 2023-03-31 DIAGNOSIS — S76102A Unspecified injury of left quadriceps muscle, fascia and tendon, initial encounter: Secondary | ICD-10-CM | POA: Diagnosis not present

## 2023-04-02 ENCOUNTER — Other Ambulatory Visit: Payer: Self-pay | Admitting: Family Medicine

## 2023-04-02 ENCOUNTER — Encounter: Payer: Self-pay | Admitting: Family Medicine

## 2023-04-02 DIAGNOSIS — J329 Chronic sinusitis, unspecified: Secondary | ICD-10-CM

## 2023-04-02 MED ORDER — DOXYCYCLINE HYCLATE 100 MG PO TABS
100.0000 mg | ORAL_TABLET | Freq: Two times a day (BID) | ORAL | 0 refills | Status: AC
Start: 2023-04-02 — End: 2023-04-09

## 2023-04-05 ENCOUNTER — Ambulatory Visit: Payer: PRIVATE HEALTH INSURANCE | Admitting: Family Medicine

## 2023-05-14 ENCOUNTER — Telehealth: Payer: Self-pay

## 2023-05-14 NOTE — Telephone Encounter (Signed)
Copied from CRM [id number]. Topic: Clinical - Request for Lab/Test Order >> May 11, 2023  4:48 PM Fuller Mandril wrote: Reason for CRM: Payer called is working on osteoporosis prevention. Records show patient may have had fracture and may be due for bone density scan. Would like someone to call back to confirm. Callback Number: [phone]

## 2023-05-14 NOTE — Telephone Encounter (Signed)
Attempted call to patient. Left a voice mail message requesting a return call.  No name of caller left on message and not sure if a confidential fax to leave information on  Bone density ordered on 07/13/2022 by Dr. Tamera Punt but not scheduled by patient.

## 2023-05-16 NOTE — Telephone Encounter (Signed)
[phone] -voice mail states VM is Clide Dales-  called this number. Left voice mail message requesting a return call- did not leave any patient identifiers as not sure if VM is confidential.

## 2023-05-17 NOTE — Telephone Encounter (Signed)
Megan Walter informed as Bone density ordered on 07/13/2022 by Dr. Tamera Punt but not scheduled by patient.  She will close out patient chart based on this information.

## 2023-05-17 NOTE — Telephone Encounter (Signed)
Copied from CRM [id number]. Topic: Clinical - Medical Advice >> May 17, 2023  8:48 AM Nila Nephew wrote: Reason for CRM:   Elease Hashimoto with [employer] calling again to inquire: Does patient have an advanced diagnosis related to osteoporosis? If patient does, please indicate. If patient does not, they will remove her from the Medicare-related project they have just started. Informed that her voicemail is confidential and information can be left there, or can be faxed back to her on form she states they sent.

## 2023-05-22 ENCOUNTER — Telehealth: Payer: Self-pay

## 2023-05-22 NOTE — Telephone Encounter (Signed)
Copied from CRM [id number]. Topic: General - Other >> May 22, 2023 11:33 AM Nila Nephew wrote: Reason for CRM: Payer calling again to inquire about osteoporosis screening. Inquire multiple times about patient fracture in March of last year after relaying provider's message regarding screening. Stated she would take note.

## 2023-08-30 ENCOUNTER — Encounter: Payer: Self-pay | Admitting: Family Medicine

## 2024-03-06 ENCOUNTER — Encounter: Payer: Self-pay | Admitting: Urgent Care

## 2024-03-06 DIAGNOSIS — F411 Generalized anxiety disorder: Secondary | ICD-10-CM

## 2024-03-07 MED ORDER — SERTRALINE HCL 25 MG PO TABS: 25.0000 mg | ORAL_TABLET | Freq: Every day | ORAL | 0 refills | Status: DC

## 2024-04-02 ENCOUNTER — Encounter: Payer: PRIVATE HEALTH INSURANCE | Admitting: Urgent Care

## 2024-04-16 ENCOUNTER — Ambulatory Visit: Payer: Self-pay

## 2024-04-16 ENCOUNTER — Ambulatory Visit
Admission: EM | Admit: 2024-04-16 | Discharge: 2024-04-16 | Disposition: A | Discharge status: Home / Self Care | Attending: Family Medicine | Admitting: Family Medicine

## 2024-04-16 DIAGNOSIS — I471 Supraventricular tachycardia, unspecified: Secondary | ICD-10-CM | POA: Diagnosis not present

## 2024-04-16 DIAGNOSIS — R0602 Shortness of breath: Secondary | ICD-10-CM

## 2024-04-16 NOTE — Discharge Instructions (Addendum)
 Patient departed clinic today with her daughter who was advised to go to [facility] ED immediately for further evaluation of SVT and shortness of breath.

## 2024-04-16 NOTE — Telephone Encounter (Signed)
" °  FYI Only or Action Required?: FYI only for provider: Going to UC.  Patient was last seen in primary care on 03/28/2023 by Bevin Bernice RAMAN, DO.  Called Nurse Triage reporting Breathing Problem.  Symptoms began several weeks ago.  Interventions attempted: Rest, hydration, or home remedies.  Symptoms are: unchanged.  Triage Disposition: See HCP Within 4 Hours (Or PCP Triage)  Patient/caregiver understands and will follow disposition?:  Reason for Disposition  [1] MILD difficulty breathing (e.g., minimal/no SOB at rest, SOB with walking, pulse < 100) AND [2] NEW-onset or WORSE than normal  Answer Assessment - Initial Assessment Questions Pt states that for several weeks, she has felt like she needs to take deep breaths occasionally. Denies recent illness or wheezing.  Currently asymptomatic.  1. RESPIRATORY STATUS: Describe your breathing? (e.g., wheezing, shortness of breath, unable to speak, severe coughing)      Intermittent SOB, resolves with deep breathing 2. ONSET: When did this breathing problem begin?      Several weeks 3. PATTERN Does the difficult breathing come and go, or has it been constant since it started?      Comes and goes 4. SEVERITY: How bad is your breathing? (e.g., mild, moderate, severe)      Resolves with deep breathing, mild 5. RECURRENT SYMPTOM: Have you had difficulty breathing before? If Yes, ask: When was the last time? and What happened that time?      Denies 6. CARDIAC HISTORY: Do you have any history of heart disease? (e.g., heart attack, angina, bypass surgery, angioplasty)      Denies 7. LUNG HISTORY: Do you have any history of lung disease?  (e.g., pulmonary embolus, asthma, emphysema)     Denies 8. CAUSE: What do you think is causing the breathing problem?      Unknown 9. OTHER SYMPTOMS: Do you have any other symptoms? (e.g., chest pain, cough, dizziness, fever, runny nose)     Denies  Protocols used: Breathing  Difficulty-A-AH  Message from [place] T sent at 04/16/2024 11:20 AM EST  Reason for Triage: having random bouts of shortness of breath without strenuous activity, at the moment is alright- [phone]   Past Medical History:  Diagnosis Date   Anxiety    "

## 2024-04-16 NOTE — ED Notes (Signed)
 Patient is being discharged from the Urgent Care and sent to the Emergency Department via ambulance . Per Ozell Major FNP, patient is in need of higher level of care due to SVT. Patient is aware and verbalizes understanding of plan of care.  Vitals:   04/16/24 1245  BP: (!) 135/90  Pulse: (!) 157  Resp: 19  Temp: 98 F (36.7 C)  SpO2: 96%

## 2024-04-16 NOTE — ED Triage Notes (Addendum)
 Pt c/o nasal congestion and shortness of breath x 2 weeks. Takes allergy meds daily. Has appt to est care with [facility]enton Gave on 01/22.

## 2024-04-16 NOTE — ED Provider Notes (Signed)
 " Megan Walter [facility]    CSN: [id number] Arrival date & time: 04/16/24  1227      History   Chief Complaint Chief Complaint  Patient presents with   Nasal Congestion   Shortness of Breath    HPI Megan Walter is a 78 y.o. female.    HPI 78 year old female presents with nasal congestion and shortness of breath for 2 weeks.  PMH significant for generalized anxiety disorder, obesity hyperkalemia, and prolonged QT interval.  Past Medical History:  Diagnosis Date   Anxiety     Patient Active Problem List   Diagnosis Date Noted   Acute cough 03/28/2023   Generalized anxiety disorder 07/13/2022   Acute blood loss anemia 07/03/2022   Hypokalemia 07/03/2022   Tibial plateau fracture, left, sequela 06/29/2022   Hypoalbuminemia 06/29/2022   Trauma 06/26/2022   Left wrist fracture, sequela 06/15/2022   Fall at home, initial encounter 06/15/2022   Hyperkalemia 06/15/2022   Prolonged QT interval 06/15/2022   Severe obesity (BMI >= 40) (HCC) 06/15/2022   Left tibial fracture 06/14/2022   Nasal congestion 02/20/2022   Strain of gluteus medius of right lower extremity 07/16/2015   Adjustment disorder with depressed mood 03/07/2013   Screening for breast cancer 01/21/2013   BRONCHITIS NOS 05/14/2006   Osteoporosis 05/14/2006    Past Surgical History:  Procedure Laterality Date   ANKLE FRACTURE SURGERY Left    hardware   CATARACT EXTRACTION Right    HARDWARE REMOVAL Left 08/11/2022   Procedure: HARDWARE REMOVAL LEFT WRIST;  Surgeon: Kendal Franky SQUIBB, MD;  Location: [facility] OR;  Service: Orthopedics;  Laterality: Left;   ORIF PROXIMAL TIBIAL PLATEAU FRACTURE Right    hardware   ORIF TIBIA PLATEAU Left 06/16/2022   Procedure: OPEN REDUCTION INTERNAL FIXATION (ORIF) TIBIAL PLATEAU;  Surgeon: Kendal Franky SQUIBB, MD;  Location: [facility] OR;  Service: Orthopedics;  Laterality: Left;   ORIF WRIST FRACTURE Left 06/16/2022   Procedure: OPEN REDUCTION INTERNAL FIXATION (ORIF) WRIST FRACTURE;   Surgeon: Kendal Franky SQUIBB, MD;  Location: [facility] OR;  Service: Orthopedics;  Laterality: Left;   WRIST FRACTURE SURGERY Right    hardware    OB History   No obstetric history on file.      Home Medications    Prior to Admission medications  Medication Sig Start Date End Date Taking? Authorizing Provider  albuterol  (VENTOLIN  HFA) 108 (90 Base) MCG/ACT inhaler Inhale 2 puffs into the lungs every 6 (six) hours as needed for wheezing or shortness of breath. 03/28/23   Bevin Bernice GORMAN, DO  busPIRone  (BUSPAR ) 5 MG tablet Take 1 tablet (5 mg total) by mouth 2 (two) times daily. 07/27/22   Bevin Bernice GORMAN, DO  chlorpheniramine-HYDROcodone  (TUSSIONEX) 10-8 MG/5ML Take 5 mLs by mouth every 12 (twelve) hours as needed for cough (cough, will cause drowsiness.). 03/28/23   Bevin Bernice GORMAN, DO  melatonin 3 MG TABS tablet Take 1 tablet (3 mg total) by mouth at bedtime. Patient taking differently: Take 10 mg by mouth at bedtime. 06/29/22   Love, Sharlet GORMAN, PA-C  methylPREDNISolone  (MEDROL  DOSEPAK) 4 MG TBPK tablet Follow instructions on pill pack 03/28/23   Bevin Bernice GORMAN, DO  sertraline  (ZOLOFT ) 25 MG tablet Take 1 tablet (25 mg total) by mouth daily. 03/07/24   Lowella Benton CROME, PA    Family History Family History  Problem Relation Age of Onset   Hypertension Mother    Heart attack Mother     Social History Social History[1]  Allergies   Patient has no known allergies.   Review of Systems Review of Systems  Respiratory:  Positive for shortness of breath.   Cardiovascular:        Elevated heart rate  All other systems reviewed and are negative.    Physical Exam Triage Vital Signs ED Triage Vitals  Encounter Vitals Group     BP 04/16/24 1245 (!) 135/90     Girls Systolic BP Percentile --      Girls Diastolic BP Percentile --      Boys Systolic BP Percentile --      Boys Diastolic BP Percentile --      Pulse Rate 04/16/24 1245 (!) 157     Resp 04/16/24 1245 19     Temp 04/16/24 1245 98 F  (36.7 C)     Temp Source 04/16/24 1245 Oral     SpO2 04/16/24 1245 96 %     Weight --      Height --      Head Circumference --      Peak Flow --      Pain Score 04/16/24 1243 0     Pain Loc --      Pain Education --      Exclude from Growth Chart --    No data found.  Updated Vital Signs BP (!) 135/90 (BP Location: Right Arm)   Pulse (!) 157   Temp 98 F (36.7 C) (Oral)   Resp 19   SpO2 96%    Physical Exam Vitals and nursing note reviewed.  Constitutional:      Appearance: Normal appearance. She is normal weight.  HENT:     Head: Normocephalic and atraumatic.     Right Ear: Tympanic membrane, ear canal and external ear normal.     Left Ear: Tympanic membrane, ear canal and external ear normal.     Mouth/Throat:     Mouth: Mucous membranes are moist.     Pharynx: Oropharynx is clear.  Eyes:     Extraocular Movements: Extraocular movements intact.     Conjunctiva/sclera: Conjunctivae normal.     Pupils: Pupils are equal, round, and reactive to light.  Cardiovascular:     Rate and Rhythm: Tachycardia present.     Heart sounds: Normal heart sounds.     Comments: Irregular rhythm and a rate, patient complaining of shortness of breath Pulmonary:     Effort: Pulmonary effort is normal.     Breath sounds: Normal breath sounds. No wheezing, rhonchi or rales.  Musculoskeletal:        General: Normal range of motion.  Skin:    General: Skin is warm and dry.  Neurological:     General: No focal deficit present.     Mental Status: She is alert and oriented to person, place, and time.  Psychiatric:        Mood and Affect: Mood normal.        Behavior: Behavior normal.      UC Treatments / Results  Labs (all labs ordered are listed, but only abnormal results are displayed) Labs Reviewed - No data to display  EKG   Radiology No results found.  Procedures Procedures (including critical care time)  Medications Ordered in UC Medications - No data to  display  Initial Impression / Assessment and Plan / UC Course  I have reviewed the triage vital signs and the nursing notes.  Pertinent labs & imaging results that were available during my care  of the patient were reviewed by me and considered in my medical decision making (see chart for details).     MDM: 1.  SVT-EKG reveals SVT with nonspecific ST and T wave abnormality, abnormal EKG EMS notified immediately.  EMS arrival at 1333 after 15 minutes of waiting.  EMS reporting that they are just a BLS crew and have no medications to push.  Advised patient's daughter to go to [facility] ED immediately for further evaluation of SVT and shortness of breath.  Patient/daughter verbalized understanding of these instructions and this plan of care today.  911 supervisor has called clinic to speak with staff reports that dispatch sent a wrong crew to our clinic today.   Final Clinical Impressions(s) / UC Diagnoses   Final diagnoses:  Shortness of breath  SVT (supraventricular tachycardia)     Discharge Instructions      Patient departed clinic today with her daughter who was advised to go to [facility] ED immediately for further evaluation of SVT and shortness of breath.     ED Prescriptions   None    PDMP not reviewed this encounter.    [1]  Social History Tobacco Use   Smoking status: Never    Passive exposure: Past  Vaping Use   Vaping status: Never Used  Substance Use Topics   Alcohol use: No   Drug use: No     Teddy Sharper, FNP 04/16/24 1402  "

## 2024-04-16 NOTE — ED Notes (Signed)
 Provider notified of pts tachycardia

## 2024-04-21 ENCOUNTER — Telehealth: Payer: Self-pay

## 2024-04-21 NOTE — Transitions of Care (Post Inpatient/ED Visit) (Signed)
" ° °  04/21/2024  Name: Megan Walter MRN: [id number] DOB: 1945-10-15  Today's TOC FU Call Status: Today's TOC FU Call Status:: Successful TOC FU Call Completed (Patient states her daughter assists as needed and declined TOC call program - Encouraged patient to schedule hospital follow up for 7-14 days) TOC FU Call Complete Date: 04/21/24  Patient's Name and Date of Birth confirmed. DOB, Name  Transition Care Management Follow-up Telephone Call Date of Discharge: 04/19/24 Discharge Facility: Other [facility]) Name of Other (Non[facility]) Discharge Facility: [facility] Type of Discharge: Inpatient Admission Primary Inpatient Discharge Diagnosis:: Rapid atrial fibrillation  Shona Prow RN, CCM [facility]  VBCI-Population Health RN Care Manager [phone]  "

## 2024-04-23 ENCOUNTER — Ambulatory Visit: Admitting: Family Medicine

## 2024-04-23 ENCOUNTER — Encounter: Payer: Self-pay | Admitting: Family Medicine

## 2024-04-23 VITALS — BP 110/78 | HR 119 | Ht 65.0 in | Wt 271.0 lb

## 2024-04-23 DIAGNOSIS — I4891 Unspecified atrial fibrillation: Secondary | ICD-10-CM

## 2024-04-23 MED ORDER — LISINOPRIL 2.5 MG PO TABS: 2.5000 mg | ORAL_TABLET | Freq: Every day | ORAL | 0 refills | Status: AC

## 2024-04-23 MED ORDER — METOPROLOL SUCCINATE ER 100 MG PO TB24: 100.0000 mg | ORAL_TABLET | Freq: Two times a day (BID) | ORAL | 0 refills | Status: AC

## 2024-04-23 NOTE — Assessment & Plan Note (Signed)
 She is still having significantly elevated HR with minimal activity.  Rechecking CMP and CBC.  EKG without new changes.  Increasing toprol to 100mg  BID.  Keep appt with cardiology.  Reasons to return to ED discussed.  Red flags reviewed.

## 2024-04-23 NOTE — Progress Notes (Signed)
 " Megan Walter - 78 y.o. female MRN [id number]  Date of birth: [birth year]  Subjective Chief Complaint  Patient presents with   Hospitalization Follow-up    HPI Megan Walter is a 78 y.o. female here today for hospital follow up.    She was initially seen at [facility] on 12/24 with complaint of dyspnea.  Referred to the ED due to EKG findings suggestive of SVT.  No chest pain upon arrival.  Found to be in A. Fib with RVR in the ED.  Loaded with diltiazem bolus  with some improvement initially but returned back to RVR shortly after.  She was subsequently admitted from 04/16/24-04/19/24. TOC call complated on 04/21/24.  During her hospitalization she was contineud on diltiazem drip and was able to wean off.  Transitioned to PO metoprolol at time of discharge  Echo with LVEF of 35-40% with moderate global hypokinesis.  Lisinopril added due to reduced EF.  Pravastatin added as well.  Plan was to follow up with cardiology in 2-3 weeks, this is scheduled for 05/10/23.  Today she reports that she is doing better overall.  Still feels tire and sluggish.  Dyspnea improved unless up and active.  HR does increase in the 150s when moving around.   ROS:  A comprehensive ROS was completed and negative except as noted per HPI  Allergies[1]  Past Medical History:  Diagnosis Date   Anxiety     Past Surgical History:  Procedure Laterality Date   ANKLE FRACTURE SURGERY Left    hardware   CATARACT EXTRACTION Right    HARDWARE REMOVAL Left 08/11/2022   Procedure: HARDWARE REMOVAL LEFT WRIST;  Surgeon: Kendal Franky SQUIBB, MD;  Location: [facility] OR;  Service: Orthopedics;  Laterality: Left;   ORIF PROXIMAL TIBIAL PLATEAU FRACTURE Right    hardware   ORIF TIBIA PLATEAU Left 06/16/2022   Procedure: OPEN REDUCTION INTERNAL FIXATION (ORIF) TIBIAL PLATEAU;  Surgeon: Kendal Franky SQUIBB, MD;  Location: [facility] OR;  Service: Orthopedics;  Laterality: Left;   ORIF WRIST FRACTURE Left 06/16/2022   Procedure: OPEN REDUCTION INTERNAL FIXATION  (ORIF) WRIST FRACTURE;  Surgeon: Kendal Franky SQUIBB, MD;  Location: [facility] OR;  Service: Orthopedics;  Laterality: Left;   WRIST FRACTURE SURGERY Right    hardware    Social History   Socioeconomic History   Marital status: Married    Spouse name: Not on file   Number of children: Not on file   Years of education: Not on file   Highest education level: 12th grade  Occupational History   Not on file  Tobacco Use   Smoking status: Never    Passive exposure: Past   Smokeless tobacco: Not on file  Vaping Use   Vaping status: Never Used  Substance and Sexual Activity   Alcohol use: No   Drug use: No   Sexual activity: Yes    Partners: Male  Other Topics Concern   Not on file  Social History Narrative   Not on file   Social Drivers of Health   Tobacco Use: Unknown (04/23/2024)   Patient History    Smoking Tobacco Use: Never    Smokeless Tobacco Use: Unknown    Passive Exposure: Past  Financial Resource Strain: Low Risk (03/28/2023)   Overall Financial Resource Strain (CARDIA)    Difficulty of Paying Living Expenses: Not hard at all  Food Insecurity: No Food Insecurity (04/16/2024)   Received from [facility]   Epic    Within the past 12 months,  you worried that your food would run out before you got the money to buy more.: Never true    Within the past 12 months, the food you bought just didn't last and you didn't have money to get more.: Never true  Transportation Needs: No Transportation Needs (04/16/2024)   Received from [facility]    In the past 12 months, has lack of transportation kept you from medical appointments or from getting medications?: No    In the past 12 months, has lack of transportation kept you from meetings, work, or from getting things needed for daily living?: No  Physical Activity: Unknown (03/28/2023)   Exercise Vital Sign    Days of Exercise per Week: 0 days    Minutes of Exercise per Session: Not on file  Stress: No Stress Concern Present  (04/16/2024)   Received from [facility] - Occupational Stress Questionnaire    Do you feel stress - tense, restless, nervous, or anxious, or unable to sleep at night because your mind is troubled all the time - these days?: Not at all  Social Connections: Moderately Isolated (03/28/2023)   Social Connection and Isolation Panel    Frequency of Communication with Friends and Family: More than three times a week    Frequency of Social Gatherings with Friends and Family: Twice a week    Attends Religious Services: Never    Database Administrator or Organizations: No    Attends Engineer, Structural: Not on file    Marital Status: Married  Depression (PHQ2-9): Low Risk (04/23/2024)   Depression (PHQ2-9)    PHQ-2 Score: 0  Alcohol Screen: Not on file  Housing: Low Risk (04/16/2024)   Received from [facility]    In the last 12 months, was there a time when you were not able to pay the mortgage or rent on time?: No    In the past 12 months, how many times have you moved where you were living?: 0    At any time in the past 12 months, were you homeless or living in a shelter (including now)?: No  Utilities: Not At Risk (04/16/2024)   Received from [facility]    In the past 12 months has the electric, gas, oil, or water company threatened to shut off services in your home?: No  Health Literacy: Not on file    Family History  Problem Relation Age of Onset   Hypertension Mother    Heart attack Mother     Health Maintenance  Topic Date Due   Hepatitis C Screening  Never done   DTaP/Tdap/Td (1 - Tdap) Never done   Zoster Vaccines- Shingrix (1 of 2) Never done   Bone Density Scan  Never done   Pneumococcal Vaccine: 50+ Years (2 of 2 - PCV20 or PCV21) 02/14/2015   Medicare Annual Wellness (AWV)  02/14/2015   Influenza Vaccine  11/23/2023   COVID-19 Vaccine (1 - 2025-26 season) Never done   Meningococcal B Vaccine   Aged Out   Mammogram  Discontinued     ----------------------------------------------------------------------------------------------------------------------------------------------------------------------------------------------------------------- Physical Exam BP 110/78   Pulse (!) 148   Ht 5' 5 (1.651 m)   Wt 271 lb (122.9 kg)   SpO2 98%   BMI 45.10 kg/m   Physical Exam Constitutional:      Appearance: Normal appearance.  Eyes:     General: No scleral icterus. Cardiovascular:  Rate and Rhythm: Rhythm irregular.     Comments: Trace b/l LE edema.  Pulmonary:     Effort: Pulmonary effort is normal.     Breath sounds: Normal breath sounds.  Neurological:     General: No focal deficit present.     Mental Status: She is alert.  Psychiatric:        Mood and Affect: Mood normal.        Behavior: Behavior normal.   EKG: nonspecific ST and T waves changes, atrial fibrillation, rate 125.   ------------------------------------------------------------------------------------------------------------------------------------------------------------------------------------------------------------------- Assessment and Plan  Atrial fibrillation with RVR (HCC) She is still having significantly elevated HR with minimal activity.  Rechecking CMP and CBC.  EKG without new changes.  Increasing toprol to 100mg  BID.  Keep appt with cardiology.  Reasons to return to ED discussed.  Red flags reviewed.    Meds ordered this encounter  Medications   metoprolol succinate (TOPROL-XL) 100 MG 24 hr tablet    Sig: Take 1 tablet (100 mg total) by mouth in the morning and at bedtime.    Dispense:  180 tablet    Refill:  0   lisinopril (ZESTRIL) 2.5 MG tablet    Sig: Take 1 tablet (2.5 mg total) by mouth daily.    Dispense:  90 tablet    Refill:  0    No follow-ups on file.        [1] No Known Allergies  "

## 2024-04-23 NOTE — Patient Instructions (Signed)
Atrial Fibrillation Atrial fibrillation (AFib) is a type of heartbeat that is irregular or fast. If you have AFib, your heart beats without any order. This makes it hard for your heart to pump blood in a normal way. AFib may come and go, or it may become a long-lasting problem. If AFib is not treated, it can put you at higher risk for stroke, heart failure, and other heart problems. What are the causes? AFib may be caused by diseases that damage the heart's electrical system. They include: High blood pressure. Heart failure. Heart valve diseases. Heart surgery. Diabetes. Thyroid disease. Kidney disease. Lung diseases, such as pneumonia or COPD. Sleep apnea. Sometimes the cause is not known. What increases the risk? You are more likely to develop AFib if: You are older. You exercise often and very hard. You have a family history of AFib. You are female. You are Caucasian. You are overweight. You smoke. You drink a lot of alcohol. What are the signs or symptoms? Common symptoms of this condition include: A feeling that your heart is beating very fast. Chest pain or discomfort. Feeling short of breath. Suddenly feeling light-headed or weak. Getting tired easily during activity. Fainting. Sweating. In some cases, there are no symptoms. How is this treated? Medicines to: Prevent blood clots. Treat heart rate or heart rhythm problems. Using devices, such as a pacemaker, to correct heart rhythm problems. Doing surgery to remove the part of the heart that sends bad signals. Closing an area where clots can form in the heart (left atrial appendage). In some cases, your doctor will treat other underlying conditions. Follow these instructions at home: Medicines Take over-the-counter and prescription medicines only as told by your doctor. Do not take any new medicines without first talking to your doctor. If you are taking blood thinners: Talk with your doctor before taking aspirin  or NSAIDs, such as ibuprofen. Take your medicines as told. Take them at the same time each day. Do not do things that could hurt or bruise you. Be careful to avoid falls. Wear an alert bracelet or carry a card that says you take blood thinners. Lifestyle Do not smoke or use any products that contain nicotine or tobacco. If you need help quitting, ask your doctor. Eat heart-healthy foods. Talk with your doctor about the right eating plan for you. Exercise regularly as told by your doctor. Do not drink alcohol. Lose weight if you are overweight. General instructions If you have sleep apnea, treat it as told by your doctor. Do not use diet pills unless your doctor says they are safe for you. Diet pills may make heart problems worse. Keep all follow-up visits. Your doctor will check your heart rate and rhythm regularly. Contact a doctor if: You notice a change in the speed, rhythm, or strength of your heartbeat. You are taking a blood-thinning medicine and you get more bruising. You get tired more easily when you move or exercise. You have a sudden change in weight. Get help right away if:  You have pain in your chest. You have trouble breathing. You have side effects of blood thinners, such as blood in your vomit, poop (stool), or pee (urine), or bleeding that cannot stop. You have any signs of a stroke. "BE FAST" is an easy way to remember the main warning signs: B - Balance. Dizziness, sudden trouble walking, or loss of balance. E - Eyes. Trouble seeing or a change in how you see. F - Face. Sudden weakness or loss of  feeling in the face. The face or eyelid may droop on one side. A - Arms.Weakness or loss of feeling in an arm. This happens suddenly and usually on one side of the body. S - Speech. Sudden trouble speaking, slurred speech, or trouble understanding what people say. T - Time.Time to call emergency services. Write down what time symptoms started. You have other signs of a  stroke, such as: A sudden, very bad headache with no known cause. Feeling like you may vomit (nausea). Vomiting. A seizure. These symptoms may be an emergency. Get help right away. Call 911. Do not wait to see if the symptoms will go away. Do not drive yourself to the hospital. This information is not intended to replace advice given to you by your health care provider. Make sure you discuss any questions you have with your health care provider. Document Revised: 12/28/2021 Document Reviewed: 12/28/2021 Elsevier Patient Education  2024 [employer].

## 2024-04-24 LAB — CBC WITH DIFFERENTIAL/PLATELET
Basophils Absolute: 0.1 x10E3/uL (ref 0.0–0.2)
Basos: 1 %
EOS (ABSOLUTE): 0.2 x10E3/uL (ref 0.0–0.4)
Eos: 2 %
Hematocrit: 38.2 % (ref 34.0–46.6)
Hemoglobin: 12.5 g/dL (ref 11.1–15.9)
Immature Grans (Abs): 0 x10E3/uL (ref 0.0–0.1)
Immature Granulocytes: 0 %
Lymphocytes Absolute: 1.6 x10E3/uL (ref 0.7–3.1)
Lymphs: 24 %
MCH: 28.9 pg (ref 26.6–33.0)
MCHC: 32.7 g/dL (ref 31.5–35.7)
MCV: 88 fL (ref 79–97)
Monocytes Absolute: 0.5 x10E3/uL (ref 0.1–0.9)
Monocytes: 8 %
Neutrophils Absolute: 4.3 x10E3/uL (ref 1.4–7.0)
Neutrophils: 65 %
Platelets: 236 x10E3/uL (ref 150–450)
RBC: 4.33 x10E6/uL (ref 3.77–5.28)
RDW: 12.2 % (ref 11.7–15.4)
WBC: 6.6 x10E3/uL (ref 3.4–10.8)

## 2024-04-24 LAB — CMP14+EGFR
ALT: 55 IU/L — ABNORMAL HIGH (ref 0–32)
AST: 33 IU/L (ref 0–40)
Albumin: 3.9 g/dL (ref 3.8–4.8)
Alkaline Phosphatase: 112 IU/L (ref 49–135)
BUN/Creatinine Ratio: 37 — ABNORMAL HIGH (ref 12–28)
BUN: 23 mg/dL (ref 8–27)
Bilirubin Total: 1.2 mg/dL (ref 0.0–1.2)
CO2: 21 mmol/L (ref 20–29)
Calcium: 9.1 mg/dL (ref 8.7–10.3)
Chloride: 105 mmol/L (ref 96–106)
Creatinine, Ser: 0.62 mg/dL (ref 0.57–1.00)
Globulin, Total: 2.3 g/dL (ref 1.5–4.5)
Glucose: 95 mg/dL (ref 70–99)
Potassium: 3.9 mmol/L (ref 3.5–5.2)
Sodium: 140 mmol/L (ref 134–144)
Total Protein: 6.2 g/dL (ref 6.0–8.5)
eGFR: 92 mL/min/1.73

## 2024-05-02 NOTE — Addendum Note (Signed)
 Addended by: BONNY JON DEL on: 05/02/2024 09:10 AM   Modules accepted: Orders

## 2024-05-09 ENCOUNTER — Ambulatory Visit: Payer: Self-pay | Admitting: Family Medicine

## 2024-05-15 ENCOUNTER — Ambulatory Visit: Payer: Self-pay | Admitting: Urgent Care

## 2024-05-15 ENCOUNTER — Ambulatory Visit

## 2024-05-15 ENCOUNTER — Ambulatory Visit: Payer: PRIVATE HEALTH INSURANCE | Admitting: Urgent Care

## 2024-05-15 VITALS — BP 139/88 | HR 109

## 2024-05-15 DIAGNOSIS — R9389 Abnormal findings on diagnostic imaging of other specified body structures: Secondary | ICD-10-CM

## 2024-05-15 DIAGNOSIS — R0601 Orthopnea: Secondary | ICD-10-CM

## 2024-05-15 DIAGNOSIS — R918 Other nonspecific abnormal finding of lung field: Secondary | ICD-10-CM

## 2024-05-15 DIAGNOSIS — R945 Abnormal results of liver function studies: Secondary | ICD-10-CM

## 2024-05-15 DIAGNOSIS — I509 Heart failure, unspecified: Secondary | ICD-10-CM

## 2024-05-15 DIAGNOSIS — I517 Cardiomegaly: Secondary | ICD-10-CM

## 2024-05-15 DIAGNOSIS — R0602 Shortness of breath: Secondary | ICD-10-CM

## 2024-05-15 DIAGNOSIS — I4891 Unspecified atrial fibrillation: Secondary | ICD-10-CM

## 2024-05-15 DIAGNOSIS — Z23 Encounter for immunization: Secondary | ICD-10-CM | POA: Diagnosis not present

## 2024-05-15 MED ORDER — FUROSEMIDE 20 MG PO TABS: 20.0000 mg | ORAL_TABLET | Freq: Every day | ORAL | 3 refills | Status: AC

## 2024-05-15 NOTE — Patient Instructions (Signed)
 Your immunzations were updated today.  Please go to [address] to obtain a chest xray. We will contact you via mychart with your labs results tomorrow.  Follow up with me in 6 weeks after your cardiac procedure.

## 2024-05-15 NOTE — Progress Notes (Unsigned)
 "  Established Patient Office Visit  Subjective:  Patient ID: Megan Walter, female    DOB: [birth year]  Age: 79 y.o. MRN: [id number]  Chief Complaint  Patient presents with   Establish Care    Not sleeping well, breathing issues    HPI  Discussed the use of AI scribe software for clinical note transcription with the patient, who Walter verbal consent to proceed.  History of Present Illness   Megan Walter is a 79 year old female with atrial fibrillation and heart failure who presents with worsening shortness of breath. She is accompanied by her daughter, Megan Walter.  She experiences persistent and severe shortness of breath, which worsens when lying down, necessitating her to sit up to breathe better. She often sleeps in a recliner instead of her bed. No heart racing or chest pain, but she notes significant fatigue and dyspnea on exertion.  Her atrial fibrillation is new since December, and she has a reduced ejection fraction of 29%.  She has significant fluid accumulation in her legs, particularly in the lower half, which started when her heart issues began. Her shoes have become tighter due to this swelling. She is not on any diuretics or fluid restrictions.  Her current medications include metoprolol , recently adjusted to 200 mg every morning, lisinopril  2.5 mg, and Eliquis , which was started in the hospital. She also takes multivitamins and sertraline  for anxiety and sleep issues. She has difficulty sleeping, often waking up after two hours and feeling the need to take deep breaths upon waking.  A recent chest x-ray showed a patchy infiltrate in the right lung. She has a history of severe pitting edema in the legs. She does have a recent dry cough but no production. She continues to experience significant symptoms.   Outside records results: 04/16/24 CXR chest IMPRESSION:  1. Patchy atelectasis or infiltrate of the right medial and left peripheral lung bases.  2.  Mild to moderate  cardiomegaly with prominence of the central vasculature.    04/17/24 Echocardiogram: Mitral Valve: Mitral valve structure is normal. The leaflets are mildly  thickened and exhibit normal excursion. There is mild annular  calcification. There is mild to moderate regurgitation with a centrally  directed jet.    Aortic Valve: The aortic valve is tricuspid. The leaflets are not  thickened and exhibit normal excursion. Trace aortic valve regurgitation.    Left Ventricle: Left ventricle is mildly dilated.  False tendon noted  in LV which is normal variant. Systolic function is moderately abnormal.  EF: 35-40%. Ejection fraction measured by 3D is 39%, which is abnormal.  There is moderate global hypokinesis of the left ventricle. Doppler  parameters consistent with restrictive filling pattern and markedly  elevated LA pressure.    Left Atrium: Left atrium is mildly dilated  at 4.500 cm. Left atrium  volume index is high normal 33.7 (16-34 mL/m2).    Right Atrium: Right atrium is mildly dilated.    Tricuspid Valve: The right ventricular systolic pressure is at upper  limit of normal  - about 32 mmHg (<36 mmHg).    04/18/24 NM Heart Spect stress IMPRESSION: Ejection fraction calculates to 29%. No ischemia      Patient Active Problem List   Diagnosis Date Noted   Atrial fibrillation with RVR (HCC) 04/23/2024   Acute cough 03/28/2023   Generalized anxiety disorder 07/13/2022   Acute blood loss anemia 07/03/2022   Hypokalemia 07/03/2022   Tibial plateau fracture, left, sequela 06/29/2022   Hypoalbuminemia 06/29/2022  Trauma 06/26/2022   Left wrist fracture, sequela 06/15/2022   Fall at home, initial encounter 06/15/2022   Hyperkalemia 06/15/2022   Prolonged QT interval 06/15/2022   Severe obesity (BMI >= 40) (HCC) 06/15/2022   Left tibial fracture 06/14/2022   Nasal congestion 02/20/2022   Strain of gluteus medius of right lower extremity 07/16/2015   Adjustment disorder with  depressed mood 03/07/2013   Screening for breast cancer 01/21/2013   BRONCHITIS NOS 05/14/2006   Osteoporosis 05/14/2006   Past Medical History:  Diagnosis Date   Anxiety    Past Surgical History:  Procedure Laterality Date   ANKLE FRACTURE SURGERY Left    hardware   CATARACT EXTRACTION Right    HARDWARE REMOVAL Left 08/11/2022   Procedure: HARDWARE REMOVAL LEFT WRIST;  Surgeon: Kendal Franky SQUIBB, MD;  Location: [facility] OR;  Service: Orthopedics;  Laterality: Left;   ORIF PROXIMAL TIBIAL PLATEAU FRACTURE Right    hardware   ORIF TIBIA PLATEAU Left 06/16/2022   Procedure: OPEN REDUCTION INTERNAL FIXATION (ORIF) TIBIAL PLATEAU;  Surgeon: Kendal Franky SQUIBB, MD;  Location: [facility] OR;  Service: Orthopedics;  Laterality: Left;   ORIF WRIST FRACTURE Left 06/16/2022   Procedure: OPEN REDUCTION INTERNAL FIXATION (ORIF) WRIST FRACTURE;  Surgeon: Kendal Franky SQUIBB, MD;  Location: [facility] OR;  Service: Orthopedics;  Laterality: Left;   WRIST FRACTURE SURGERY Right    hardware   Social History[1]    ROS: as noted in HPI  Objective:     BP 139/88   Pulse (!) 109   SpO2 94%  BP Readings from Last 3 Encounters:  05/15/24 139/88  04/23/24 110/78  04/16/24 (!) 135/90   Wt Readings from Last 3 Encounters:  04/23/24 271 lb (122.9 kg)  03/28/23 257 lb 4 oz (116.7 kg)  08/11/22 250 lb (113.4 kg)      Physical Exam Vitals and nursing note reviewed. Exam conducted with a chaperone present.  Constitutional:      General: She is not in acute distress.    Appearance: Normal appearance. She is not ill-appearing, toxic-appearing or diaphoretic.  HENT:     Head: Normocephalic and atraumatic.     Nose: Nose normal.     Mouth/Throat:     Mouth: Mucous membranes are moist.     Pharynx: Oropharynx is clear. No oropharyngeal exudate or posterior oropharyngeal erythema.  Eyes:     General: No scleral icterus.       Right eye: No discharge.        Left eye: No discharge.     Extraocular Movements: Extraocular  movements intact.     Pupils: Pupils are equal, round, and reactive to light.  Cardiovascular:     Rate and Rhythm: Normal rate. Rhythm irregular.     Heart sounds: Murmur heard.  Pulmonary:     Effort: Pulmonary effort is normal. No respiratory distress.     Breath sounds: Rhonchi (bibasilar) present.  Musculoskeletal:        General: Swelling present.     Right lower leg: Edema present.     Left lower leg: Edema present.  Lymphadenopathy:     Cervical: No cervical adenopathy.  Skin:    General: Skin is warm and dry.     Coloration: Skin is not jaundiced.     Findings: No bruising, erythema or rash.  Neurological:     General: No focal deficit present.     Mental Status: She is alert and oriented to person, place, and time.  Comments: Pt seated in wheelchair  Psychiatric:        Mood and Affect: Mood normal.        Behavior: Behavior normal.      No results found for any visits on 05/15/24.  Last CBC Lab Results  Component Value Date   WBC 6.6 04/23/2024   HGB 12.5 04/23/2024   HCT 38.2 04/23/2024   MCV 88 04/23/2024   MCH 28.9 04/23/2024   RDW 12.2 04/23/2024   PLT 236 04/23/2024   Last metabolic panel Lab Results  Component Value Date   GLUCOSE 95 04/23/2024   NA 140 04/23/2024   K 3.9 04/23/2024   CL 105 04/23/2024   CO2 21 04/23/2024   BUN 23 04/23/2024   CREATININE 0.62 04/23/2024   EGFR 92 04/23/2024   CALCIUM  9.1 04/23/2024   PROT 6.2 04/23/2024   ALBUMIN 3.9 04/23/2024   LABGLOB 2.3 04/23/2024   BILITOT 1.2 04/23/2024   ALKPHOS 112 04/23/2024   AST 33 04/23/2024   ALT 55 (H) 04/23/2024   ANIONGAP 12 06/28/2022   Last lipids Lab Results  Component Value Date   CHOL 189 02/13/2014   HDL 63 02/13/2014   LDLCALC 108 (H) 02/13/2014   TRIG 90 02/13/2014   CHOLHDL 3.0 02/13/2014   Last hemoglobin A1c Lab Results  Component Value Date   HGBA1C 5.5 06/27/2022   Last thyroid functions No results found for: TSH, T3TOTAL, T4TOTAL,  FREET4, THYROIDAB Last vitamin D  Lab Results  Component Value Date   VD25OH 38.98 06/27/2022   Last vitamin B12 and Folate No results found for: VITAMINB12, FOLATE    The 10-year ASCVD risk score (Arnett DK, et al., 2019) is: 22%  Assessment & Plan:  Atrial fibrillation with RVR (HCC) -     CMP14+EGFR -     CBC with Differential/Platelet -     DG Chest 2 View; Future -     Pro b natriuretic peptide (BNP)  Abnormal liver function -     CMP14+EGFR  Orthopnea -     CMP14+EGFR -     CBC with Differential/Platelet -     DG Chest 2 View; Future -     Pro b natriuretic peptide (BNP)  Cardiomegaly -     CMP14+EGFR -     CBC with Differential/Platelet -     DG Chest 2 View; Future -     Pro b natriuretic peptide (BNP)  Abnormal chest x-ray -     DG Chest 2 View; Future -     Pro b natriuretic peptide (BNP)  Shortness of breath -     CMP14+EGFR -     CBC with Differential/Platelet -     DG Chest 2 View; Future -     Pro b natriuretic peptide (BNP)  Immunization due -     Pneumococcal conjugate vaccine 20-valent -     Flu vaccine HIGH DOSE PF(Fluzone Trivalent)  Assessment and Plan    Atrial fibrillation with rapid ventricular response Asymptomatic with palpitations, significant dyspnea. EKG shows AFib, rate 103 bpm. Cardiologist adjusted medications, increased metoprolol . Cardioversion scheduled February 10th. Symptoms may relate to fluid retention. - Continue metoprolol  200 mg every morning. - Proceed with scheduled cardioversion on February 10th. - Ordered chest x-ray to assess for fluid retention. - Rechecked BNP for fluid retention.  Chronic systolic heart failure Reduced ejection fraction 29%. Symptoms: dyspnea, orthopnea, peripheral edema. No diuretics prescribed, possibly contributing to fluid retention and symptoms. - Ordered chest x-ray  for fluid retention. - Rechecked BNP for fluid retention. - Consider adding diuretic like Lasix  if fluid retention  confirmed.  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 Current sertraline  may not address sleep issues. - Address heart failure symptoms to improve sleep. - Consider diuretic therapy to reduce fluid retention and improve sleep.  Depressive symptoms Frustration and distress related to health issues. On sertraline  for anxiety and sleep post-leg fracture. Increasing dose considered - Continue current sertraline  dose. - Reassess depressive symptoms post-cardioversion.  Immunization counseling and administration (influenza and pneumococcal) Counseled on influenza and pneumococcal vaccines. Agreed to receive today. - Administered influenza and pneumococcal vaccines.      I spent 45 minutes of total time managing this patient today, this includes chart review, face to face, and non-face to face time, reviewing outside records and labs and providing personal interpretation.    Return in about 6 weeks (around 06/26/2024).   Megan LITTIE Gave, PA     [1]  Social History Tobacco Use   Smoking status: Never    Passive exposure: Past  Vaping Use   Vaping status: Never Used  Substance Use Topics   Alcohol use: No   Drug use: No   "

## 2024-05-16 LAB — CMP14+EGFR
ALT: 41 IU/L — ABNORMAL HIGH (ref 0–32)
AST: 32 IU/L (ref 0–40)
Albumin: 3.8 g/dL (ref 3.8–4.8)
Alkaline Phosphatase: 89 IU/L (ref 49–135)
BUN/Creatinine Ratio: 42 — ABNORMAL HIGH (ref 12–28)
BUN: 29 mg/dL — ABNORMAL HIGH (ref 8–27)
Bilirubin Total: 2.6 mg/dL — ABNORMAL HIGH (ref 0.0–1.2)
CO2: 19 mmol/L — ABNORMAL LOW (ref 20–29)
Calcium: 8.8 mg/dL (ref 8.7–10.3)
Chloride: 108 mmol/L — ABNORMAL HIGH (ref 96–106)
Creatinine, Ser: 0.69 mg/dL (ref 0.57–1.00)
Globulin, Total: 2.3 g/dL (ref 1.5–4.5)
Glucose: 86 mg/dL (ref 70–99)
Potassium: 3.7 mmol/L (ref 3.5–5.2)
Sodium: 148 mmol/L — ABNORMAL HIGH (ref 134–144)
Total Protein: 6.1 g/dL (ref 6.0–8.5)
eGFR: 89 mL/min/1.73

## 2024-05-16 LAB — CBC WITH DIFFERENTIAL/PLATELET
Basophils Absolute: 0.1 x10E3/uL (ref 0.0–0.2)
Basos: 1 %
EOS (ABSOLUTE): 0.2 x10E3/uL (ref 0.0–0.4)
Eos: 2 %
Hematocrit: 40.3 % (ref 34.0–46.6)
Hemoglobin: 12.7 g/dL (ref 11.1–15.9)
Immature Grans (Abs): 0 x10E3/uL (ref 0.0–0.1)
Immature Granulocytes: 0 %
Lymphocytes Absolute: 1.3 x10E3/uL (ref 0.7–3.1)
Lymphs: 18 %
MCH: 27.6 pg (ref 26.6–33.0)
MCHC: 31.5 g/dL (ref 31.5–35.7)
MCV: 88 fL (ref 79–97)
Monocytes Absolute: 0.6 x10E3/uL (ref 0.1–0.9)
Monocytes: 8 %
Neutrophils Absolute: 5 x10E3/uL (ref 1.4–7.0)
Neutrophils: 71 %
Platelets: 238 x10E3/uL (ref 150–450)
RBC: 4.6 x10E6/uL (ref 3.77–5.28)
RDW: 12.4 % (ref 11.7–15.4)
WBC: 7.2 x10E3/uL (ref 3.4–10.8)

## 2024-05-16 LAB — PRO B NATRIURETIC PEPTIDE: NT-Pro BNP: 2511 pg/mL — ABNORMAL HIGH (ref 0–738)

## 2024-05-16 MED ORDER — APIXABAN 5 MG PO TABS: 5.0000 mg | ORAL_TABLET | Freq: Two times a day (BID) | ORAL | 2 refills | Status: AC

## 2024-05-21 ENCOUNTER — Ambulatory Visit: Admitting: Urgent Care

## 2024-05-22 ENCOUNTER — Ambulatory Visit (INDEPENDENT_AMBULATORY_CARE_PROVIDER_SITE_OTHER): Admitting: Urgent Care

## 2024-05-22 ENCOUNTER — Encounter: Payer: Self-pay | Admitting: Urgent Care

## 2024-05-22 VITALS — BP 122/87 | HR 82 | Wt 271.0 lb

## 2024-05-22 DIAGNOSIS — F411 Generalized anxiety disorder: Secondary | ICD-10-CM

## 2024-05-22 DIAGNOSIS — R918 Other nonspecific abnormal finding of lung field: Secondary | ICD-10-CM | POA: Diagnosis not present

## 2024-05-22 DIAGNOSIS — I517 Cardiomegaly: Secondary | ICD-10-CM

## 2024-05-22 DIAGNOSIS — I340 Nonrheumatic mitral (valve) insufficiency: Secondary | ICD-10-CM

## 2024-05-22 DIAGNOSIS — I5041 Acute combined systolic (congestive) and diastolic (congestive) heart failure: Secondary | ICD-10-CM

## 2024-05-22 DIAGNOSIS — I509 Heart failure, unspecified: Secondary | ICD-10-CM

## 2024-05-22 DIAGNOSIS — G4709 Other insomnia: Secondary | ICD-10-CM | POA: Diagnosis not present

## 2024-05-22 MED ORDER — MIRTAZAPINE 7.5 MG PO TABS: 7.5000 mg | ORAL_TABLET | Freq: Every day | ORAL | 5 refills | Status: AC

## 2024-05-22 NOTE — Patient Instructions (Addendum)
 Please start cutting the sertraline  (zoloft ) in half. Take 1/2 tab daily x 2 weeks, then take 1/2 tab every other day for 2 weeks.  Start mirtazepine 7.5mg  nightly. If after 2 weeks you are still having issues sleeping, or anxiety, may increase to 15mg  (two tabs) nightly.  Please go to [address] to schedule a CT scan of your chest.   I will send you a message in response to your labs once received.

## 2024-05-22 NOTE — Progress Notes (Signed)
 "  Established Patient Office Visit  Subjective:  Patient ID: Megan Walter, female    DOB: 1945-07-27  Age: 79 y.o. MRN: [id number]  Chief Complaint  Patient presents with   Follow-up    HPI  Discussed the use of AI scribe software for clinical note transcription with the patient, who gave verbal consent to proceed.  History of Present Illness   Megan Walter is a 79 year old female with congestive heart failure who presents with persistent shortness of breath and cough.  She experiences persistent shortness of breath and cough, which have not changed since her last visit. The shortness of breath worsens when lying down, and she has significant issues with sleep, attributing these to anxiety and stress. She is currently taking sertraline  for anxiety.  She was previously prescribed Lasix  to address swelling, but has not noticed any significant improvement. No heart racing or chest pain is reported. A recent chest x-ray showed a small amount of fluid and elevated BNP levels.  Her daughter mentioned that she had pneumonia as a child and inquired if current lung findings could be related to scarring from that infection. She is currently on 20 mg of Lasix  and sertraline , with discussions about transitioning to mirtazapine  to aid with sleep and anxiety.   She denies any new or acute symptoms today. Has upcoming cardioversion scheduled.        ROS: as noted in HPI  Objective:     BP 122/87   Pulse 82   Wt 271 lb (122.9 kg)   SpO2 99%   BMI 45.10 kg/m    Wt Readings from Last 3 Encounters:  05/22/24 271 lb (122.9 kg)  04/23/24 271 lb (122.9 kg)  03/28/23 257 lb 4 oz (116.7 kg)    BP Readings from Last 3 Encounters:  05/22/24 122/87  05/15/24 139/88  04/23/24 110/78     Physical Exam Vitals and nursing note reviewed. Exam conducted with a chaperone present.  Constitutional:      General: She is not in acute distress.    Appearance: Normal appearance. She is not  ill-appearing, toxic-appearing or diaphoretic.  HENT:     Head: Normocephalic and atraumatic.     Nose: Nose normal.     Mouth/Throat:     Mouth: Mucous membranes are moist.     Pharynx: Oropharynx is clear. No oropharyngeal exudate or posterior oropharyngeal erythema.  Eyes:     General: No scleral icterus.       Right eye: No discharge.        Left eye: No discharge.     Extraocular Movements: Extraocular movements intact.     Pupils: Pupils are equal, round, and reactive to light.  Cardiovascular:     Rate and Rhythm: Normal rate. Rhythm irregular.     Heart sounds: Murmur heard.  Pulmonary:     Effort: Pulmonary effort is normal. No respiratory distress.     Breath sounds: No stridor. Rhonchi (minimal to RLL only) present. No wheezing or rales.  Chest:     Chest wall: No tenderness.  Musculoskeletal:        General: Swelling present.     Right lower leg: Edema present.     Left lower leg: Edema present.  Lymphadenopathy:     Cervical: No cervical adenopathy.  Skin:    General: Skin is warm and dry.     Coloration: Skin is not jaundiced.     Findings: No bruising, erythema or rash.  Neurological:  General: No focal deficit present.     Mental Status: She is alert and oriented to person, place, and time.     Comments: Pt seated in wheelchair  Psychiatric:        Mood and Affect: Mood normal.        Behavior: Behavior normal.      No results found for any visits on 05/22/24.     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  Dispense: 30 tablet; Refill: 5  Generalized anxiety disorder -     Mirtazapine ; Take 1 tablet (7.5 mg total) by mouth at bedtime.  Dispense: 30 tablet; Refill: 5  Assessment and Plan    Acute combined systolic and diastolic heart failure Persistent dyspnea and cough with elevated BNP suggest fluid overload. Differential includes interstitial lung disease versus cardiac etiology. - Rechecked BNP level to assess fluid status. - Continue Lasix  20 mg daily. - Adjust Lasix  dose based on BNP results. - Scheduled CT high resolution to differentiate between cardiac and pulmonary causes of dyspnea.  Interstitial lung abnormality Chest x-ray suggests interstitial lung disease. Differential includes pleural effusion or scarring, but fluid overload is more likely. - Scheduled CT high resolution to evaluate for interstitial lung disease.  Depression with anxiety and sleep disturbance Sertraline  is inadequate for sleep disturbance. Anxiety and stress affect sleep quality. Mirtazapine  considered for anxiety and sleep. - Initiated mirtazapine  7.5 mg at bedtime. - Provided taper schedule for sertraline  to transition to mirtazapine . - Instructed to increase mirtazapine  to 15 mg if no improvement after one week.     Biliirubinemia Elevated levels noted on last eval. Will repeat today.    No follow-ups on file.   Benton LITTIE Gave, PA "

## 2024-05-23 ENCOUNTER — Ambulatory Visit: Admitting: Urgent Care

## 2024-05-23 LAB — CBC WITH DIFFERENTIAL/PLATELET
Basophils Absolute: 0.1 10*3/uL (ref 0.0–0.2)
Basos: 1 %
EOS (ABSOLUTE): 0.1 10*3/uL (ref 0.0–0.4)
Eos: 2 %
Hematocrit: 40.5 % (ref 34.0–46.6)
Hemoglobin: 12.8 g/dL (ref 11.1–15.9)
Immature Grans (Abs): 0 10*3/uL (ref 0.0–0.1)
Immature Granulocytes: 0 %
Lymphocytes Absolute: 1.5 10*3/uL (ref 0.7–3.1)
Lymphs: 22 %
MCH: 28.4 pg (ref 26.6–33.0)
MCHC: 31.6 g/dL (ref 31.5–35.7)
MCV: 90 fL (ref 79–97)
Monocytes Absolute: 0.6 10*3/uL (ref 0.1–0.9)
Monocytes: 9 %
Neutrophils Absolute: 4.4 10*3/uL (ref 1.4–7.0)
Neutrophils: 66 %
Platelets: 229 10*3/uL (ref 150–450)
RBC: 4.51 x10E6/uL (ref 3.77–5.28)
RDW: 12.5 % (ref 11.7–15.4)
WBC: 6.8 10*3/uL (ref 3.4–10.8)

## 2024-05-23 LAB — HEPATIC FUNCTION PANEL
ALT: 29 [IU]/L (ref 0–32)
AST: 22 [IU]/L (ref 0–40)
Albumin: 3.6 g/dL — ABNORMAL LOW (ref 3.8–4.8)
Alkaline Phosphatase: 87 [IU]/L (ref 49–135)
Bilirubin Total: 2.6 mg/dL — ABNORMAL HIGH (ref 0.0–1.2)
Bilirubin, Direct: 0.65 mg/dL — ABNORMAL HIGH (ref 0.00–0.40)
Total Protein: 6 g/dL (ref 6.0–8.5)

## 2024-05-23 LAB — RETICULOCYTES: Retic Ct Pct: 1.5 % (ref 0.6–2.6)

## 2024-05-23 LAB — BILIRUBIN, FRACTIONATED(TOT/DIR/INDIR): Bilirubin, Indirect: 1.95 mg/dL — ABNORMAL HIGH (ref 0.10–0.80)

## 2024-05-23 LAB — LACTATE DEHYDROGENASE: LDH: 181 [IU]/L (ref 119–226)

## 2024-05-23 LAB — PRO B NATRIURETIC PEPTIDE: NT-Pro BNP: 4939 pg/mL — ABNORMAL HIGH (ref 0–738)

## 2024-05-24 ENCOUNTER — Ambulatory Visit: Payer: Self-pay | Admitting: Urgent Care

## 2024-05-30 ENCOUNTER — Other Ambulatory Visit

## 2024-06-26 ENCOUNTER — Ambulatory Visit: Admitting: Urgent Care
